# Patient Record
Sex: Male | Born: 1950 | Race: Black or African American | Hispanic: No | Marital: Married | Smoking: Never smoker
Health system: Southern US, Community
[De-identification: ages and names within clinical notes are randomized; demographics above are authoritative.]

## PROBLEM LIST (undated history)

## (undated) DIAGNOSIS — E119 Type 2 diabetes mellitus without complications: Secondary | ICD-10-CM

## (undated) DIAGNOSIS — E785 Hyperlipidemia, unspecified: Secondary | ICD-10-CM

## (undated) DIAGNOSIS — F419 Anxiety disorder, unspecified: Secondary | ICD-10-CM

## (undated) DIAGNOSIS — I10 Essential (primary) hypertension: Secondary | ICD-10-CM

## (undated) HISTORY — DX: Essential (primary) hypertension: I10

## (undated) HISTORY — DX: Hyperlipidemia, unspecified: E78.5

## (undated) HISTORY — PX: APPENDECTOMY: SHX54

## (undated) HISTORY — DX: Type 2 diabetes mellitus without complications: E11.9

## (undated) HISTORY — DX: Anxiety disorder, unspecified: F41.9

---

## 2006-11-15 ENCOUNTER — Ambulatory Visit: Payer: Self-pay | Admitting: Internal Medicine

## 2006-11-15 ENCOUNTER — Inpatient Hospital Stay (HOSPITAL_COMMUNITY): Admission: EM | Admit: 2006-11-15 | Discharge: 2006-11-18 | Payer: Self-pay | Admitting: Internal Medicine

## 2006-11-16 ENCOUNTER — Encounter: Payer: Self-pay | Admitting: Cardiovascular Disease

## 2006-11-30 ENCOUNTER — Ambulatory Visit: Payer: Self-pay | Admitting: Internal Medicine

## 2006-12-11 ENCOUNTER — Ambulatory Visit: Payer: Self-pay | Admitting: Emergency Medicine

## 2007-03-23 ENCOUNTER — Ambulatory Visit: Payer: Self-pay | Admitting: Cardiology

## 2007-12-30 ENCOUNTER — Ambulatory Visit: Payer: Self-pay | Admitting: Cardiology

## 2008-01-10 ENCOUNTER — Ambulatory Visit: Payer: Self-pay | Admitting: Cardiology

## 2010-08-18 ENCOUNTER — Encounter: Payer: Self-pay | Admitting: Emergency Medicine

## 2010-11-20 ENCOUNTER — Other Ambulatory Visit: Payer: Self-pay | Admitting: Family Medicine

## 2010-11-20 ENCOUNTER — Ambulatory Visit
Admission: RE | Admit: 2010-11-20 | Discharge: 2010-11-20 | Disposition: A | Discharge status: Home / Self Care | Payer: PRIVATE HEALTH INSURANCE | Source: Ambulatory Visit | Attending: Family Medicine | Admitting: Family Medicine

## 2010-11-20 ENCOUNTER — Other Ambulatory Visit: Payer: Self-pay

## 2010-11-20 DIAGNOSIS — R0789 Other chest pain: Secondary | ICD-10-CM

## 2010-11-20 DIAGNOSIS — R9389 Abnormal findings on diagnostic imaging of other specified body structures: Secondary | ICD-10-CM

## 2010-11-20 MED ORDER — IOHEXOL 300 MG/ML  SOLN
75.0000 mL | Freq: Once | INTRAMUSCULAR | Status: AC | PRN
  Administered 2010-11-20: 75 mL via INTRAVENOUS

## 2010-12-10 NOTE — Assessment & Plan Note (Signed)
[facility]                          [facility] OFFICE NOTE   NAME:Barry Smith, Barry Smith                          MRN:          [id number]  DATE:12/30/2007                            DOB:          1950/09/15    REFERRING PHYSICIAN:  Ernestina Penna, M.D.   REASON FOR REFERRAL:  Chest pain.   HISTORY OF PRESENT ILLNESS:  [name] is a 60 year old male patient  with a history of hypertension, diabetes, and hyperlipidemia who was  evaluated by Dr. Gala Romney in April 2008 when he presented with  shortness of breath and chest pain.  He was seen by pulmonology and was  treated for pneumonia.  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  He did have an echocardiogram that was somewhat abnormal, with an  EF of 45%-50% and noted hypokinesis of the posterolateral wall.  The  patient did see Dr. Gala Romney in followup.  He recommended that the  patient undergo stress testing.  The patient was to follow up with Dr.  Andee Lineman in [place] due to convenience.  The stress test was done in August  2008 and revealed an EF of 55%.  There was a small basal inferior defect  which was nonreversible that could be consistent with diaphragmatic  attenuation.  However, a small area of prior infarct or scar could not  be ruled out.  This study was felt to be low risk at that time.   The patient improved from a pulmonary standpoint.  He was in his usual  state of health until approximately 2 months ago, when he developed  right arm and chest pain while at rest.  He described the chest pain as  a sharp discomfort.  It lasted for 48 hours and was constant.  The pain  did worsen with any type of activity.  He did note associated shortness  of breath.  He also noted some symptoms that sound consistent with  orthopnea at the time.  He denied any cough at the time.  There was no  associated nausea or diaphoresis.   There was no associated syncope.  Since this episode, he has felt normal.  He is quite active at the  copper mine.  He goes up multiple steps a day without chest pain or  shortness of breath.  He was doing yard work this morning for 4 hours  without chest pain or shortness of breath.  He denies any orthopnea,  PND, or pedal edema.  Denies any syncope or near-syncope.   CURRENT MEDICATIONS:  1. Aspirin 81 mg daily.  2. Vytorin 10/40 mg daily.  3. Metformin 500 mg 2 the morning, 3 in the evening.  4. Diovan 80 mg daily.  5. Garlic pills over the counter 2 b.i.d.  6. Januvia 100 mg daily.  7. Claritin p.r.n.   ALLERGIES:  NO KNOWN DRUG ALLERGIES.   PAST MEDICAL HISTORY:  1. Hypertension.  2. Diabetes.  3. Hyperlipidemia.  4. Status post appendectomy as a child.  SOCIAL HISTORY:  He denies any tobacco or alcohol abuse.  He is married  and has two adult children.  He works at [employer] in  [place].   FAMILY HISTORY:  Significant for diabetes mellitus in his father.  He  died in his 69s of complications from diabetes.  No premature CAD noted.   REVIEW OF SYSTEMS:  Please see HPI.  Denies any fevers, chills, cough,  melena, hematochezia, hematuria, or dysuria.  Denies any claudication  sounds.  The rest of the review of systems are negative.   PHYSICAL EXAMINATION:  GENERAL:  He is a well-nourished, well-developed  male, in no distress.  VITAL SIGNS:  Blood pressure 112/72, pulse 86, weight 186 pounds.  HEENT:  Normal.  NECK:  Without JVD.  LYMPHATIC:  Without lymphadenopathy.  ENDOCRINE:  Without thyromegaly.  CARDIAC:  Normal S1, S2.  Regular rate and rhythm.  No murmur.  LUNGS:  Clear to auscultation bilaterally.  ABDOMEN:  Soft, nontender, with normoactive bowel sounds.  No  organomegaly.  EXTREMITIES:  Without edema.  Calves soft, nontender.  SKIN:  Warm and dry.  NEUROLOGIC:  He is alert and oriented x3.  Cranial nerves II-XII are  grossly intact.   VASCULAR:  No carotid artery bruits noted bilaterally.  Femoral artery  pulses are 2+ bilaterally, without bruits.  Dorsalis pedis and posterior  tibialis pulses are 2+ bilaterally.   Electrocardiogram reveals sinus rhythm, with a heart rate of 71, normal  axis LVH, interventricular conduction delay.  Small Q-waves in II, III,  and aVF.  When compared to previous tracings, the Q-waves seem to be  chronic.   ASSESSMENT AND PLAN:  1. Recent episode of chest pain.  His symptoms certainly sound      worrisome for an out-of-hospital myocardial infarction.  His EKG      does not look that different from prior tracings.  He is not having      any symptoms of angina at this point in time and overall seems to      be stable.  After further discussion with Dr. Myrtis Ser, we have decided      to proceed with a stress Cardiolite study as well as an      echocardiogram.  If he has significantly reduced LV function or a      high-risk scan, then we will certainly need to consider further      evaluation (i.e., cardiac catheterization).  2. Multiple cardiac risk factors, including diabetes mellitus,      hypertension, and hyperlipidemia.  He will need continued      aggressive risk factor modification.  He will continue on an      aspirin a day.  His goal LDL is less than or equal to 70, given his      coronary artery disease equivalent (diabetes mellitus).  Management      of his lipids and hypertension will be per his primary care      physician.   DISPOSITION:  The patient follow up with Dr. Myrtis Ser or myself in the next  4 weeks for followup on the above.      Tereso Newcomer, PA-C  Electronically Signed      Luis Abed, MD, [facility]  Electronically Signed   SW/MedQ  DD: 12/30/2007  DT: 12/30/2007  Job #: [id number]   cc:   Ernestina Penna, M.D.

## 2010-12-10 NOTE — Assessment & Plan Note (Signed)
[facility]                            CARDIOLOGY OFFICE NOTE   NAME:Barry Smith, Temme                          MRN:          [id number]  DATE:11/30/2006                            DOB:          01/10/1951    PRIMARY CARE PHYSICIAN:  Lindaann Pascal, PA-C, [facility].   PULMONOLOGIST:  Leslye Peer, MD.   INTERVAL HISTORY:  [name] is a very pleasant, 60 year old male who  was recently admitted for acute dyspnea. He was found to have  pneumonitis which was thought secondary to copper inhalation although  his copper level was normal. He improved markedly with steroids. While  in the hospital, he also had a 2-D echocardiogram which showed an EF of  45 to 50%. There was some question of hypokinesis over the  posterolateral wall. There was no significant valvular disease. He was  also found to have a thyroid nodule.   He returns today for routine followup. He is feeling much better. He is  getting back to all of his activities without any significant dyspnea.  He has not had any chest pain.   PAST MEDICAL HISTORY:  1. History of pneumonitis in April 2008 thought secondary to copper      inhalation with normal copper level.  2. Diabetes.  3. Hypertension.  4. Hypercholesterolemia.  5. Thyroid nodule.   CURRENT MEDICATIONS:  1. Aspirin 81.  2. Vytorin.  3. Metformin 1000 in the morning, 1500 at night.  4. Diovan.   PHYSICAL EXAMINATION:  GENERAL:  He is well-appearing in no acute  distress. He ambulates around the clinic without any respiratory  difficulty.  VITAL SIGNS:  Blood pressure is 122/62, heart rate is 85.  HEENT:  Normal.  NECK:  Supple, no JVD. Carotids are 2+ bilaterally without any bruits.  There is no lymphadenopathy or thyroid.  CARDIAC:  Regular rate and rhythm. No murmurs, rubs or gallops.  LUNGS:  Clear.  ABDOMEN:  Soft, nontender, nondistended, there is no hepatosplenomegaly,  no bruits, no masses,  good bowel sounds.  EXTREMITIES:  Warm with no cyanosis, clubbing or edema. Good pulses.  NEUROLOGIC:  He is alert and oriented x3. Cranial nerves II-XII are  intact. He does have a hoarse voice. Moves all 4 extremities without  difficulty. Affect is bright.   EKG shows normal sinus rhythm with sinus arrhythmia. There is minimal T  wave flattening in the high lateral wall, also mild U waves.   ASSESSMENT/PLAN:  1. Shortness of breath. His pneumonitis seems to be resolving. He is      now wearing a mask at work. He will followup with pulmonary.  2. Abnormal echocardiogram. This is asymptomatic. We will plan on a      stress test, a treadmill Myoview. He has asked that we schedule      this in [place] if possible.  3. Hypertension well controlled.  4. Hyperlipidemia followed by primary care Arriona Prest.   DISPOSITION:  Mr. Ore is asked to transfer his care to Dr. Andee Lineman in  [place] if at all possible to save  on commuting. We will send him a copy of  his note. I told him he is to feel free to call me at any time if he  should need anything.     Bevelyn Buckles. Bensimhon, MD     DRB/MedQ  DD: 11/30/2006  DT: 11/30/2006  Job #: [id number]   cc:   Lindaann Pascal, PA-C  Leslye Peer, MD

## 2010-12-13 NOTE — Assessment & Plan Note (Signed)
[facility]                             PULMONARY OFFICE NOTE   NAME:Barry Smith, Barry Smith                          MRN:          [id number]  DATE:12/11/2006                            DOB:          27-Oct-1950    HISTORY:  Mr. Jurewicz is a 60 year old gentleman who was admitted to the  hospital between November 15, 2006, and November 18, 2006, for acute dyspnea.  He also has some associated chest discomfort and bilateral interstitial  infiltrates.  He was initially admitted to the cardiology service.  A CT  scan of his chest on November 16, 2006, showed no evidence of pulmonary  embolism, a right thyroid hypo-density, bibasilar bronchiectasis and  atelectasis, tiny bibasilar pleural effusions, cardiomegaly and mild  bilateral hilar mediastinal lymphadenopathy.  Pulmonary was consulted.  He was  treated empirically for possible community-acquired pneumonia,  versus bronchitis.   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  It was also felt that his  bronchiectasis and chronic flares of pulmonary disease were due to his  copper dust exposure.  Sarcoidosis and lymphoma were also  considerations, but were felt to be much less likely.  A serological  workup was completed, as detailed below.  He was treated with  corticosteroids and has improved.  He is now finished with his  prednisone.  His breathing is about back to baseline.  He complains of a  hoarse voice.  He continues to have a dry cough, especially at night  when he goes to sleep.   CURRENT MEDICATIONS:  1. Aspirin 81 mg daily.  2. Vytorin once daily.  3. Metformin 1000 mg q.a.m. and 1500 mg q. afternoon.  4. Diovan once daily.   PHYSICAL EXAMINATION:  GENERAL:  This pleasant African/American man, who  is in no distress.  LUNGS:  He has a very hoarse voice.  He is coughing  during the  examination.  He has no stridor.  His lungs are now clear to  auscultation bilaterally, which is an improvement compared with his  hospital exam, at which time he was coarse.  HEART:  Regular without murmur.  ABDOMEN:  Obese but benign.  EXTREMITIES:  No clubbing, cyanosis or edema.   A CT scan of his chest is as detailed above.   LABORATORY DATA:  From his hospitalization revealed a serum copper level  of 116, which is within the normal range.  CH-50 compliment of 63.  C-  reactive protein 8.9, which is elevated.  Antinuclear antibodies were  negative.  Rheumatoid factor was negative.  His ESR was 36.   Pulmonary function testing performed today shows normal air flow without  any bronchodilator responsiveness.  His lung volumes are normal and his  diffusion capacity is normal.   IMPRESSION:  1. Recent admission with dyspnea and hypoxemia, probably acute      pneumonitis, secondary to copper dust exposure:  I believe he      merits a  repeat CT scan of the chest next month, to ensure that his      infiltrates are improved and that his bronchiectasis is stable.  2. Bronchiectasis.  3. Chronic cough:  I question whether postnasal drip or      gastroesophageal reflux disease may be influencing this.  I doubt      that this is bronchiectasis, in the absence of sputum.  I will      treat him empirically with Claritin 10 mg daily, Nasacort two      sprays to each nostril daily and Prilosec b.i.d., to see if I can      influence the factors effecting his cough.   FOLLOWUP:  Mr. Erdahl will return to see me in one month, to review the  above.     Leslye Peer, MD  Electronically Signed    RSB/MedQ  DD: 12/28/2006  DT: 12/29/2006  Job #: [id number]

## 2010-12-13 NOTE — Discharge Summary (Signed)
Barry Smith, Barry Smith                 ACCOUNT NO.:  [id number]   MEDICAL RECORD NO.:  [id number]          PATIENT TYPE:  INP   LOCATION:  2918                         FACILITY:  [facility]   PHYSICIAN:  Bevelyn Buckles. Bensimhon, MDDATE OF BIRTH:  1951-06-11   DATE OF ADMISSION:  11/15/2006  DATE OF DISCHARGE:  11/18/2006                               DISCHARGE SUMMARY   PRIMARY CARE PHYSICIAN:  Lindaann Pascal, physician assistant at [facility].   PRIMARY CARDIOLOGIST:  Dr. Arvilla Meres.   CONSULTING PHYSICIAN:  Dr. Delton Coombes.   PRIMARY DIAGNOSES:  1. Hypoxia in the setting of pneumonitis.  2. Diabetes.  3. Hypertension.  4. Thyroid nodule.  5. Hypercholesterolemia.  6. 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   HISTORY OF PRESENT ILLNESS:  A 60 year old African-American male with a  history of diabetes and hypertension along with hyperlipidemia who  presented to primary care providers office after 4 days of continued  shortness of breath and some chest pain with some fevers.  The patient  was seen at [facility] and transferred here for cardiology  service secondary to continued shortness of breath and chest discomfort.  The patient also had complaints of non-productive cough associated with  fevers and chills.  The patient has been started on a Z-pack and  Tussionex prior to admission through his primary care [name].   The patient was seen and examined by Dr. Arvilla Meres on arrival.  He was found to be cool and clammy, and markedly short of breath.  Dr.  Delton Coombes was consulted concerning pulmonary status.  The patient was  started on a non-rebreather mask for assist with pulmonary status.   The patient did have cardiac enzymes and a BMP along with a chest x-ray  completed.  The chest x-ray was unimpressive.  He did have a follow up  CT scan to rule out PE, which was also found to be negative.  Incidentally, the patient was found to have a right thyroid  hypodensity  on the CT scan with mild bibasilar hilar and mid adenopathy.  Ultrasound  of the thyroid was recommended.  Lymphoma and sarcoidosis are also  considerations per radiologist.  During hospitalization, the patient was  started on p.o. prednisone and Avelox, along with respiratory breathing  treatments.  The patient's copper level was also evaluated during  hospitalization and found to be 121, which was in the normal limit range  per pulmonary.  ESR was found to be 36.  The patient's CRP was found to  be 8.9.   On the day of discharge, the patient was seen and examined by myself,  Dr. Arvilla Meres, and Dr. Delton Coombes.  The patient was found to be stable  for discharge.  The patient will continue 7 days of steroids and 10 days  of antibiotics post discharge.  He is to follow up with Dr. Delton Coombes for  continued pulmonary care and PFTs.  The patient will also follow up with  Dr. Gala Romney in 2 weeks for evaluation of cardiac status and planned  outpatient stress Myoview.  The patient is also  to follow with his  primary care [name], Lindaann Pascal, P.A., for further evaluation of the  thyroid nodule with ultrasound recommended.   DISCHARGE LABS:  Sodium 134, potassium 4.2, chloride 102.  CO2 25, BUN  7, creatinine 0.8, glucose 161.  Hemoglobin 12.3, hematocrit 35.6, white  blood cells 6.4, platelets 224,000.  Cholesterol 117, lipids 119, HDL  25, LDL 68.  TSH 1.497.  CRP 8.9.  Copper level 121.   VITAL SIGNS:  Blood pressure 104/69, heart rate 75, respirations 18.   DISCHARGE MEDICATIONS:  1. Aspirin 81 mg daily.  2. Prednisone 20 mg times three days, then prednisone 10 mg times      three days, then stop.  3. Avelox 400 mg daily times ten days.  4. Vytorin at home dose.  5. Metformin 500 mg once a day.   ALLERGIES:  NO KNOWN DRUG ALLERGIES.   FOLLOW UP PLANS AND APPOINTMENTS:  1. The patient will follow up with his primary care Zarina Pe, Lindaann Pascal, P.A. at [facility] for continued      medical management.  It is advised that the patient have follow up      thyroid ultrasound secondary to nodule recently found on CT scan.  2. The patient will follow up with Dr. Arvilla Meres on Nov 30, 2006      at 11:45 a.m. for continued cardiac management with discussion of      need for outpatient stress Myoview.  3. The patient is to follow up with Dr. Delton Coombes on Dec 02, 2006 at 9:20      in the morning for continued pulmonary evaluation and management      with PFTs planned.  4. The patient is to wear a mask at work.  Masks from the hospital are      provided to the patient to use while at work during the next 2      weeks until seen again by pulmonary.  5. The patient has been advised on new medications and prescriptions      which are provided.  6. The patient can return to work on Monday, November 23, 2006 as long as      he wears his mask.   Time spent with the patient to include physician time 35 minutes.      Bettey Mare. Lyman Bishop, NP      Bevelyn Buckles. Bensimhon, MD  Electronically Signed    KML/MEDQ  D:  11/18/2006  T:  11/18/2006  Job:  [id number]   cc:   Leslye Peer, MD  Lindaann Pascal, [place].A.

## 2010-12-13 NOTE — H&P (Signed)
Barry Smith, Barry Smith                 ACCOUNT NO.:  [id number]   MEDICAL RECORD NO.:  [id number]          PATIENT TYPE:  INP   LOCATION:  2918                         FACILITY:  [facility]   PHYSICIAN:  Bevelyn Buckles. Bensimhon, MDDATE OF BIRTH:  1951-06-13   DATE OF ADMISSION:  11/15/2006  DATE OF DISCHARGE:                              HISTORY & PHYSICAL   PRIMARY CARE PHYSICIAN:  Scott Long, PA-C. at [facility].  He is new to [facility].   REASON FOR ADMISSION:  Shortness of breath, cough, chest pain.   HISTORY OF PRESENT ILLNESS:  Mr. Lunz is a very pleasant 60 year old  male with history of hypertension, hyperlipidemia, and diabetes.  He has  no known history of coronary artery disease.  He has never had a  previous stress test or cardiac catheterization.  He is a nonsmoker.  He  was well until Thursday when he developed severe nonproductive cough  associated with fevers and chills.  He saw his primary care physician on  Friday.  Started a Z-Pak and  Tussionex.  Over the weekend, he had increased shortness of breath with  a nonproductive cough.  He has had also pleuritic chest pain associated  with a cough.  He finally went to [facility].  He was diaphoretic  and short of breath.  His temperature was 99.  His chest x-ray was  negative.  EKG had a questionable sinus tachycardia with questionable  minimal ST elevations in V1.  Troponin was 0.04 and BNP was 7.  White  count was 6.9.  There was concern for unstable angina.  He was started  on heparin and also given morphine for chest pain when he dropped his  systolic blood pressure from 110 into the 90s.  He was given Tylenol,  over 2 L of normal saline for resuscitation.   On arrival from [facility], he was clammy with systolic blood  pressure in the 120s.  He markedly short of breath with talking and  unable to speak in full sentences.  His respiratory rate ranged from the  20s to the 30s.   He denied any chest pain.  His saturations were 93% on  2 L and he was changed to a nonrebreather for comfort.   On review of systems, he denies any hemoptysis.  He has not had sick  contacts.  There has been no TB exposure.  He denies any diarrhea.  No  neurologic symptoms.  No lower extremity edema.  No orthopnea or PND.  He denies any previous history of exertional chest pain.  The remainder  of the review of systems is negative except for HPI and problem list.   PAST MEDICAL HISTORY:  1. Diabetes for three years.  2. Hypertension.  3. Hyperlipidemia.   CURRENT MEDICATIONS:  1. Vytorin unknown dose.  2. Aspirin 81 mg a day.  3. Metformin 500 mg a day.  4. Zithromax.  5. Tussionex.   ALLERGIES:  No known drug allergies.   SOCIAL HISTORY:  [place].  He  denies smoking.  He denies any alcohol or drugs.  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   FAMILY HISTORY:  No family history of coronary artery disease.  His  father died due to diabetes over 35 years ago.  He does not know how old  he was.  Mother is alive and well.  He has 12 brothers and sisters with  no family history of premature coronary artery disease.   PHYSICAL EXAMINATION:  GENERAL:  He is tachypenic.  It is hard for him  to speak in full sentences.  VITAL SIGNS:  Respiratory rate ranges from 20 to 30.  Oxygen saturation  93-94% on 2 L.  HEENT:  Normal  NECK:  Supple.  No JVD.  Carotids are 2+ bilaterally without any bruits.  There is no lymphadenopathy or thyromegaly.  CARDIAC:  He has regular rate and rhythm.  No murmurs, rubs or gallops.  LUNGS:  Clear with no wheezes or rales.  ABDOMEN:  Mildly distended, but nontender.  Soft, obese.  No  hepatosplenomegaly.  No bruits.  No masses appreciated.  EXTREMITIES:  Initially clammy but are now warm and dry.  Good distal  pulses.  No cyanosis, clubbing or edema.  NEUROLOGIC:  Alert and oriented x3.  Cranial nerves II-XII are  grossly  intact. Moves all four extremities without difficulty.  Affect is  pleasant.   LABORATORY DATA:  Chest x-ray shows just bibasilar atelectasis with mild  cardiomegaly.  No infiltrate or heart failure.  EKG shows normal sinus  rhythm at a rate of 79 with LVH.  There is some mild J point elevation.  No acute ST-T wave abnormalities.   Labs showed a white count of 6.9, 80% polys, hemoglobin 13.3, platelets  241,000.  Sodium 128, potassium 3.5, BUN 10, creatinine 0.9, chloride  92, bicarb 24. BNP 7.  CK 647, MB 0.9, troponin 0.04.   Bedside echo showed an EF of 60% with no wall motion abnormalities.  There is no AI or MR.  The RV view is not well seen but appeared grossly  normal.   ASSESSMENT:  1. Dyspnea of unclear etiology.  2. Pleuritic chest pain.  3. Fevers.  4. Hypertension.  5. Hyperlipidemia.  6. Diabetes.  7. Hyponatremia.   PLAN/DISCUSSION:  Given his normal echo, BNP and cardiac enzymes in the  setting of persistent cough and fevers, my suspicion is that this is a  primary pulmonary problem in nature, either pneumonia or pulmonary  embolus.  His chest x-ray is unimpressive.  We will proceed with a CT  scan of the chest to rule out PE and also to get further  evaluation of his lung parenchyma.  Also check blood cultures and sputum  cultures and cycle his cardiac markers to rule out myocardial  infarction.  Will hold the antibiotics for now and continue heparin  until the results of his CT scan.      Bevelyn Buckles. Bensimhon, MD  Electronically Signed     DRB/MEDQ  D:  11/16/2006  T:  11/16/2006  Job:  [id number]   cc:   Lindaann Pascal, PA-C

## 2010-12-13 NOTE — H&P (Signed)
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  He is a nonsmoker and  has never really had a cardiac workup.  The cardiologist has worked him  up and are unclear at this time of his shortness of breath complaint.  To note, he went to [facility] and was transferred here under  the cardiology service, underwent testing and at this time is not  significant for cardiac etiology.  We were called for consultation  secondary to shortness of breath with an unclear etiology at this time.  Mr. Allen again denies hemoptysis, but does have shortness of breath  and fevers over the 4 or so day duration.  He did say that there was  something going around at work, but he does not note any significant  major inhalation episode recently.  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  He is being treated appropriately for pneumonia  at this time and then recently had a spiral CT of his chest, which did  not show any pulmonary embolism, but did show bronchiectasis.   ALLERGIES:  NO KNOWN DRUG ALLERGIES.   PAST MEDICAL HISTORY/SURGICAL HISTORY:  1. Diabetes.  2. Hypertension.  3. Hyperlipidemia.  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   SOCIAL HISTORY:  He is married, has a daughter.  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  He  says the dust is clear, but you know that you are being exposed to it.   FAMILY HISTORY:  Positive for diabetes.   REVIEW OF SYSTEMS:  No significant weight loss.  No hemoptysis.  No  burning with urination.  No blood seen in the urine.  There is no  history of sarcoidosis, lung cancer.   MEDICATIONS IN HOSPITAL:  Avelox, Xopenex, Atrovent, aspirin, Protonix,  Lovenox, insulin and Mucinex.   PHYSICAL EXAMINATION:  VITAL SIGNS:  Shows a temperature of 98.7.  Heart  rate 93.  Blood pressure 104/57.  Saturation 94%.  Respiratory rate 16.  He has net 1.13 liters over the last 24 hours.  GENERAL:  [age]-year-old male in no respiratory distress, speaking  full sentences with having significant coughing with a dry cough with  no  apparent distress.  HEENT EXAMINATION:  Showed no thyroid nodule felt.  I did palpate his  thyroid, which seemed to be equal, slightly right side greater than  left, but essentially equal with no  lymphadenopathy in the  supraclavicular or infraclavicular areas or the axillary areas.  LUNG EXAMINATION:  Showed slight coarseness bilaterally anteriorly.  No  rhonchi.  No wheezes.  HEART EXAM:  S1, S2.  Regular rate, but tachycardic.  No rubs, no  murmurs, no gallops.  ABDOMINAL EXAMINATION:  Soft belly.  Bowel sounds are present.  Nontender, nondistended.  No rebound.  No guarding.  EXTREMITY EXAMINATION:  Showed no edema with good pulses.  CENTRAL NERVOUS SYSTEM EXAMINATION:  Alert and oriented x3.  Nonfocal.   LABORATORY DATA:  Showed a portable chest x-ray with a slight elevation  in the left hemidiaphragm consistent with bilateral atelectasis with  small lung volumes with a slight interstitial prominence, which is  probably related to  his small lung volume.  CT scan showed  bronchiectasis bilaterally with some bibasilar atelectasis with no  significant major pulmonary embolism noted.  Chemistry showed a sodium  of 137, potassium 3.9, chloride 103, bicarb 25, BUN 7, creatinine 0.84,  glucose 132.  Troponin negative.  White count is 5.4, hemoglobin 12.7,  hematocrit 37.2, platelet count 222.  Last blood gas sample shows 7.43  is the pH, PCO2 of 31, PAO2 of 57 only saturating 89%, not listed how  much oxygen it was on.  LFTs, which were done, which are within normal  limits.   ASSESSMENT:  1. Hypoxia.      a.     Etiology to rule out bronchitis of viral versus bacteria.      b.     To rule out occupational exposure with acute copper       inhalation injury and to rule out sarcoidosis.  2. Bronchiectasis.  Most like secondary to copper dust exposure.  3. Rule out thyroid nodule.   PLAN:  This patient has a very strong history for copper dust exposure.  I see in multiple patients with inhalation injury, which presents very  similarly with severe cough with fevers and acute shortness of breath.  It is very well possible that this patient is suffering from copper dust  exposure, which he is unaware of.  I will send a serum copper level,  which may be helpful.  I am not quite sure, we will have to see what  level comes back.  This patient does have a temperature of 102.  I will  reculture him with his blood and see if is productive.  Send a sputum  gram stain culture sensitivity.  If this patient were to decline his  respiratory status, I would add vancomycin until methicillin-resistant  staphylococcus aureus is ruled out because he is still having fever  spikes on Avelox.  Avelox is a wonderful choice for antibiotic, which  will be continued because it does cover atypicals very aggressively and  for a community acquired process.  I will check a urine strep antigen and also will assess the thyroid nodules seen on CT scan  with an  ultrasound, as well as checking thyroid functions and serum testing.  I  will also send an ACE level as far as sarcoidosis is concerned and there  was also some mild mediastinal lymphadenopathy noted on CAT scan, which  could be just inflammatory in nature and reactive.  This patient also I  would recommend  pulmonary function testing as an outpatient.  I have  discussed this with the wife at the bedside and patient and let them  know that when his acute illness is improving, he should require that.  Also, codeine in the form of Robitussin to control such a significant  cough and will await urine Legionella testing.  Also, there is no  defined infiltrate on CT scan of the chest; however, he does present  with what seems to be possible an atypical illness.  We will titrate his  oxygen down to keep his saturations at 94% or greater.  This  presentation also could be viral in nature and we will be assessing a  differential as well.  Thank you for the consultation and taking part in  this patient's care.     Nelda Bucks, MD  Electronically Signed    DJF/MEDQ  D:  11/16/2006  T:  11/16/2006  Job:  [id number]   cc:   Bevelyn Buckles. Bensimhon, MD

## 2012-11-09 ENCOUNTER — Other Ambulatory Visit: Payer: Self-pay | Admitting: *Deleted

## 2012-11-09 MED ORDER — METFORMIN HCL 500 MG PO TABS: ORAL_TABLET | ORAL | Status: DC

## 2012-12-22 ENCOUNTER — Telehealth: Payer: Self-pay | Admitting: Family Medicine

## 2012-12-22 NOTE — Telephone Encounter (Signed)
no samples patient aware

## 2012-12-24 ENCOUNTER — Ambulatory Visit: Payer: Self-pay | Admitting: Family Medicine

## 2012-12-24 NOTE — Telephone Encounter (Signed)
Pt aware no samples available . 

## 2013-01-04 ENCOUNTER — Telehealth: Payer: Self-pay | Admitting: *Deleted

## 2013-01-04 NOTE — Telephone Encounter (Signed)
Change to losartan 100mg  po qd

## 2013-01-04 NOTE — Telephone Encounter (Signed)
Ins co. Denied coverage on diovan  Until he tries one of these 1)candesartan/HCTZ,2(ibesartan, 3)ibersartan/HCTZ 4) valsartan/HCTZ 5)losartan 6)losartan/HCTZ  7) eprosartan, 8) eprotosartan/HCTZ. Can you help me with this. He was on bisoprolol/HCTZ at one time but has been on diovan since 2007

## 2013-01-06 ENCOUNTER — Other Ambulatory Visit: Payer: Self-pay | Admitting: Family Medicine

## 2013-01-06 MED ORDER — LOSARTAN POTASSIUM 100 MG PO TABS: 100.0000 mg | ORAL_TABLET | Freq: Every day | ORAL | Status: DC

## 2013-01-06 NOTE — Telephone Encounter (Signed)
lOSARTAN 100MG  1 Q DAY # 30 WITH  2 REFILLS CALLED IN TO [facility] IN [place]

## 2013-01-07 ENCOUNTER — Telehealth: Payer: Self-pay | Admitting: *Deleted

## 2013-01-07 ENCOUNTER — Other Ambulatory Visit: Payer: Self-pay | Admitting: Family Medicine

## 2013-01-07 MED ORDER — SIMVASTATIN 20 MG PO TABS: 20.0000 mg | ORAL_TABLET | Freq: Every day | ORAL | Status: DC

## 2013-01-07 NOTE — Telephone Encounter (Signed)
I had a call from the ins co today and they denied Barry Smith's vytorin, they said he has to have tried and failed simvastatin, pravastatin or atorvastatin before they will approve his vytorin.  I tried arguing the fact that neither of those is equivalent to vytorin. However they said the Dr. Could call and state his case, I tried. Can you call in one of these or call and have a go with the ins co either one.  Thanks

## 2013-01-07 NOTE — Telephone Encounter (Signed)
I will rx him simvastatin.

## 2013-01-11 ENCOUNTER — Telehealth: Payer: Self-pay | Admitting: Family Medicine

## 2013-01-11 DIAGNOSIS — E119 Type 2 diabetes mellitus without complications: Secondary | ICD-10-CM

## 2013-01-11 NOTE — Telephone Encounter (Signed)
Spoke with wife  Questions regarding meds that are not in epic She was advise we would get paper chart and call her tomorrow

## 2013-01-12 MED ORDER — LIRAGLUTIDE 18 MG/3ML ~~LOC~~ SOPN: 1.2000 "application " | PEN_INJECTOR | Freq: Every day | SUBCUTANEOUS | Status: DC

## 2013-01-12 NOTE — Telephone Encounter (Addendum)
  Spoke with wife needs samples of victoza  Aware sample is in refrig for victoza

## 2013-01-12 NOTE — Telephone Encounter (Signed)
Wife  aware victoza in refrig

## 2013-01-14 ENCOUNTER — Other Ambulatory Visit: Payer: Self-pay

## 2013-01-14 DIAGNOSIS — E119 Type 2 diabetes mellitus without complications: Secondary | ICD-10-CM

## 2013-01-14 MED ORDER — LIRAGLUTIDE 18 MG/3ML ~~LOC~~ SOPN: 1.2000 "application " | PEN_INJECTOR | Freq: Every day | SUBCUTANEOUS | Status: DC

## 2013-01-14 NOTE — Telephone Encounter (Signed)
Last glucose level 08/23/12

## 2013-02-10 ENCOUNTER — Other Ambulatory Visit: Payer: Self-pay

## 2013-02-10 MED ORDER — METFORMIN HCL 500 MG PO TABS: ORAL_TABLET | ORAL | Status: DC

## 2013-02-11 ENCOUNTER — Encounter: Payer: Self-pay | Admitting: Family Medicine

## 2013-02-11 ENCOUNTER — Ambulatory Visit (INDEPENDENT_AMBULATORY_CARE_PROVIDER_SITE_OTHER): Payer: BC Managed Care – PPO | Admitting: Family Medicine

## 2013-02-11 VITALS — BP 112/69 | HR 93 | Temp 97.0°F | Wt 183.6 lb

## 2013-02-11 DIAGNOSIS — E1159 Type 2 diabetes mellitus with other circulatory complications: Secondary | ICD-10-CM | POA: Insufficient documentation

## 2013-02-11 DIAGNOSIS — I10 Essential (primary) hypertension: Secondary | ICD-10-CM

## 2013-02-11 DIAGNOSIS — I152 Hypertension secondary to endocrine disorders: Secondary | ICD-10-CM | POA: Insufficient documentation

## 2013-02-11 DIAGNOSIS — E785 Hyperlipidemia, unspecified: Secondary | ICD-10-CM

## 2013-02-11 DIAGNOSIS — E1169 Type 2 diabetes mellitus with other specified complication: Secondary | ICD-10-CM | POA: Insufficient documentation

## 2013-02-11 DIAGNOSIS — E119 Type 2 diabetes mellitus without complications: Secondary | ICD-10-CM | POA: Insufficient documentation

## 2013-02-11 LAB — COMPLETE METABOLIC PANEL WITH GFR
ALT: 22 U/L (ref 0–53)
AST: 20 U/L (ref 0–37)
Albumin: 5 g/dL (ref 3.5–5.2)
Alkaline Phosphatase: 57 U/L (ref 39–117)
BUN: 12 mg/dL (ref 6–23)
CO2: 29 mEq/L (ref 19–32)
Calcium: 9.9 mg/dL (ref 8.4–10.5)
Chloride: 102 mEq/L (ref 96–112)
Creat: 0.83 mg/dL (ref 0.50–1.35)
GFR, Est African American: >89 mL/min
GFR, Est Non African American: >89 mL/min
Glucose, Bld: 74 mg/dL (ref 70–99)
Potassium: 4.6 mEq/L (ref 3.5–5.3)
Sodium: 138 mEq/L (ref 135–145)
Total Bilirubin: 0.4 mg/dL (ref 0.3–1.2)
Total Protein: 7.3 g/dL (ref 6.0–8.3)

## 2013-02-11 LAB — POCT UA - MICROALBUMIN: Microalbumin Ur, POC: POSITIVE mg/L

## 2013-02-11 LAB — POCT GLYCOSYLATED HEMOGLOBIN (HGB A1C): Hemoglobin A1C: 6.3

## 2013-02-11 NOTE — Patient Instructions (Addendum)
Dr Woodroe Mode Recommendations  Diet and Exercise discussed with patient.  For nutrition information, I recommend books:  1).Eat to Live by Dr Monico Hoar. 2).Prevent and Reverse Heart Disease by Dr Suzzette Righter. 3) Dr Katherina Right Book:  Program to Reverse Diabetes  Exercise recommendations are:  If unable to walk, then the patient can exercise in a chair 3 times a day. By flapping arms like a bird gently and raising legs outwards to the front.  If ambulatory, the patient can go for walks for 30 minutes 3 times a week. Then increase the intensity and duration as tolerated.  Goal is to try to attain exercise frequency to 5 times a week.  If applicable: Best to perform resistance exercises (machines or weights) 2 days a week and cardio type exercises 3 days per week.    Diabetes and Foot Care Diabetes may cause you to have a poor blood supply (circulation) to your legs and feet. Because of this, the skin may be thinner, break easier, and heal more slowly. You also may have nerve damage in your legs and feet causing decreased feeling. You may not notice minor injuries to your feet that could lead to serious problems or infections. Taking care of your feet is one of the most important things you can do for yourself.  HOME CARE INSTRUCTIONS  Do not go barefoot. Bare feet are easily injured.  Check your feet daily for blisters, cuts, and redness.  Wash your feet with warm water (not hot) and mild soap. Pat your feet and between your toes until completely dry.  Apply a moisturizing lotion that does not contain alcohol or petroleum jelly to the dry skin on your feet and to dry brittle toenails. Do not put it between your toes.  Trim your toenails straight across. Do not dig under them or around the cuticle.  Do not cut corns or calluses, or try to remove them with medicine.  Wear clean cotton socks or stockings every day. Make sure they are not too tight. Do not wear  knee high stockings since they may decrease blood flow to your legs.  Wear leather shoes that fit properly and have enough cushioning. To break in new shoes, wear them just a few hours a day to avoid injuring your feet.  Wear shoes at all times, even in the house.  Do not cross your legs. This may decrease the blood flow to your feet.  If you find a minor scrape, cut, or break in the skin on your feet, keep it and the skin around it clean and dry. These areas may be cleansed with mild soap and water. Do not use peroxide, alcohol, iodine or Merthiolate.  When you remove an adhesive bandage, be sure not to harm the skin around it.  If you have a wound, look at it several times a day to make sure it is healing.  Do not use heating pads or hot water bottles. Burns can occur. If you have lost feeling in your feet or legs, you may not know it is happening until it is too late.  Report any cuts, sores or bruises to your caregiver. Do not wait! SEEK MEDICAL CARE IF:   You have an injury that is not healing or you notice redness, numbness, burning, or tingling.  Your feet always feel cold.  You have pain or cramps in your legs and feet. SEEK IMMEDIATE MEDICAL CARE IF:   There is increasing redness,  swelling, or increasing pain in the wound.  There is a red line that goes up your leg.  Pus is coming from a wound.  You develop an unexplained oral temperature above 102 F (38.9 C), or as your caregiver suggests.  You notice a bad smell coming from an ulcer or wound. MAKE SURE YOU:   Understand these instructions.  Will watch your condition.  Will get help right away if you are not doing well or get worse. Document Released: 07/11/2000 Document Revised: 10/06/2011 Document Reviewed: 01/17/2009 [facility] Patient Information 2014 [place], [place].   Diabetic Retinopathy Having diabetes for a long time, especially if it is not controlled, can damage the light-sensitive membrane at the  back of the eye (retina). The disease of the retina caused by diabetes is called diabetic retinopathy. Taking good care of your diabetes helps reduce the risk of developing diabetic retinopathy. Have regular eye exams. Early detection is the key to keeping your eyes healthy. Diabetes can also affect other parts of the eye with vision-threatening results, such as cataracts and a form of glaucoma that is very difficult to treat. SYMPTOMS  In the early stages of diabetic retinopathy, there are often no symptoms. As the condition advances, symptoms may include:  Blurred vision. This may go away when blood glucose (sugar) levels are normal. This type of reversible change in vision is usually due to swelling of the lens.  Moving speck or dark spots (floaters) in your vision. This can be caused by small amounts of blood (hemorrhages) escaping from the blood vessels of the retina.  Missing parts of your field of vision. This can be caused by larger hemorrhages within the tissue of the retina.  Poor night vision.  Poor color vision.  Sudden drop or loss of vision in one eye. This may be caused by a hemorrhage from retinal blood vessels into the cavity of the inside of the eye. You should not wait until you have symptoms. An eye care specialist can start treatment before visual impairment occurs. DIAGNOSIS  Your eye care specialist can detect the diabetic changes in your blood vessels by putting drops in your eyes to enlarge the size of (dilate) your pupils. This allows a bigger "window" through which the caregiver can see the entire inside of your eyes.  TREATMENT   If you have the type of diabetes that requires you to use insulin, your risk of diabetic retinopathy is very high. You should have your eyes checked at least every 6 months.  If you have diabetes that was diagnosed during childhood or before the age of 66, your risk of diabetic retinopathy is very high. You should have your eyes checked at  least every 6 months.  If you have diabetes that is controlled by diet, you should have the dilated eye exam when first diagnosed and yearly thereafter.  If your diabetes is not under good control as measured by your blood glucose levels and other indicators, it is critical that you have your eyes checked even more often. Your caregiver can usually see the problems of diabetic retinopathy developing long before it causes a problem. In many cases, it can be treated to prevent complications. HOME CARE INSTRUCTIONS   Keep blood pressure in goal range.  Keep blood glucose in target range.  Follow your caregiver's orders regarding diet and other means for controlling your blood glucose levels.  Check both your urine and blood levels for glucose as recommended by your caregiver. SEEK MEDICAL CARE IF:  You notice gradual blurring or other changes in your vision over time.  You notice that your glasses or contact lenses do not make things look as sharp as they once did.  You have trouble reading or seeing details at a distance with either eye. SEEK IMMEDIATE MEDICAL CARE IF:   You notice a sudden change in your vision or parts of your field of vision appear missing or hazy. This may mean you have lost some vision. Get help right away to prevent further vision loss.  You suddenly see moving specks or dark spots in the field of vision of either eye.  You have a sudden partial or total loss of vision in either eye. Document Released: 07/11/2000 Document Revised: 10/06/2011 Document Reviewed: 04/04/2009 [facility] Patient Information 2014 [place], [place].

## 2013-02-11 NOTE — Progress Notes (Signed)
Patient ID: Barry Smith, male   DOB: 1951/07/24, 62 y.o.   MRN: [id number] SUBJECTIVE: CC: Chief Complaint  Patient presents with  . Follow-up    3 month follow up diabetes    HPI: Patient is here for follow up of Diabetes Mellitus/hypertension/hyperlipidemia Symptoms of DM: Denies Nocturia ,Denies Urinary Frequency , denies Blurred vision ,deniesDizziness,denies.Dysuria,denies paresthesias, denies extremity pain or ulcers.Marland Kitchendenies chest pain. has had an annual eye exam. do check the feet. Does check CBGs. Average CBG:150 Denies episodes of hypoglycemia. Does have an emergency hypoglycemic plan. admits toCompliance with medications. Problems with medications. Cannot afford the victoza  Breakfast: tomato sandwich, steak and eggs Lunch: whatever he can grab Supper whatever his wife cooks.  Past Medical History  Diagnosis Date  . Anxiety   . Hypertension   . Hyperlipidemia   . Diabetes mellitus without complication    Past Surgical History  Procedure Laterality Date  . Appendectomy     History   Social History  . Marital Status: Single    Spouse Name: N/A    Number of Children: N/A  . Years of Education: N/A   Occupational History  . Not on file.   Social History Main Topics  . Smoking status: Never Smoker   . Smokeless tobacco: Not on file  . Alcohol Use: No  . Drug Use: No  . Sexually Active: Yes   Other Topics Concern  . Not on file   Social History Narrative  . No narrative on file   Family History  Problem Relation Age of Onset  . Diabetes Father   . Diabetes Sister   . Pneumonia Brother   . Diabetes Sister   . Diabetes Sister   . Diabetes Sister   . Diabetes Sister   . Diabetes Sister   . Cancer Sister   . Diabetes Brother   . Diabetes Brother   . Diabetes Brother   . Diabetes Brother   . Diabetes Brother    Current Outpatient Prescriptions on File Prior to Visit  Medication Sig Dispense Refill  . aspirin 81 MG tablet Take 81 mg by mouth  daily.      . cholecalciferol (VITAMIN D) 1000 UNITS tablet Take 1,000 Units by mouth daily.      . Eszopiclone (ESZOPICLONE) 3 MG TABS Take 3 mg by mouth at bedtime. Take immediately before bedtime      . glimepiride (AMARYL) 2 MG tablet Take 2 mg by mouth daily before breakfast.      . Liraglutide (VICTOZA) 18 MG/3ML SOPN Inject 1.2 application into the skin daily.  3 mL  0  . losartan (COZAAR) 100 MG tablet Take 1 tablet (100 mg total) by mouth daily.  90 tablet  3  . metFORMIN (GLUCOPHAGE) 500 MG tablet Take 2 tabs every am and 3 tabs at bedtime  150 tablet  2  . simvastatin (ZOCOR) 20 MG tablet Take 1 tablet (20 mg total) by mouth at bedtime.  90 tablet  3   No current facility-administered medications on file prior to visit.   No Known Allergies  There is no immunization history on file for this patient. Prior to Admission medications   Medication Sig Start Date End Date Taking? Authorizing Provider  aspirin 81 MG tablet Take 81 mg by mouth daily.   Yes Historical Provider, MD  ezetimibe-simvastatin (VYTORIN) 10-40 MG per tablet Take 1 tablet by mouth at bedtime.   Yes Historical Provider, MD  Multiple Vitamin (MULTIVITAMIN) capsule Take 1 capsule by  mouth daily.   Yes Historical Provider, MD  cholecalciferol (VITAMIN D) 1000 UNITS tablet Take 1,000 Units by mouth daily.    Historical Provider, MD  Eszopiclone (ESZOPICLONE) 3 MG TABS Take 3 mg by mouth at bedtime. Take immediately before bedtime    Historical Provider, MD  glimepiride (AMARYL) 2 MG tablet Take 2 mg by mouth daily before breakfast.    Historical Provider, MD  Liraglutide (VICTOZA) 18 MG/3ML SOPN Inject 1.2 application into the skin daily. 01/14/13   Mary-Margaret Daphine Deutscher, FNP  losartan (COZAAR) 100 MG tablet Take 1 tablet (100 mg total) by mouth daily. 01/06/13   Deatra Canter, FNP  metFORMIN (GLUCOPHAGE) 500 MG tablet Take 2 tabs every am and 3 tabs at bedtime 02/10/13   Ileana Ladd, MD  simvastatin (ZOCOR) 20 MG  tablet Take 1 tablet (20 mg total) by mouth at bedtime. 01/07/13   Deatra Canter, FNP   ROS: As above in the HPI. All other systems are stable or negative.  OBJECTIVE: APPEARANCE:  Patient in no acute distress.The patient appeared well nourished and normally developed. Acyanotic. Waist: VITAL SIGNS:BP 112/69  Pulse 93  Temp(Src) 97 F (36.1 C) (Oral)  Wt 183 lb 9.6 oz (83.28 kg)   SKIN: warm and  Dry without overt rashes, tattoos and scars  HEAD and Neck: without JVD, Head and scalp: normal Eyes:No scleral icterus. Fundi normal, eye movements normal. Ears: Auricle normal, canal normal, Tympanic membranes normal, insufflation normal. Nose: normal Throat: normal Neck & thyroid: normal  CHEST & LUNGS: Chest wall: normal Lungs: Clear  CVS: Reveals the PMI to be normally located. Regular rhythm, First and Second Heart sounds are normal,  absence of murmurs, rubs or gallops. Peripheral vasculature: Radial pulses: normal Dorsal pedis pulses: normal Posterior pulses: normal  ABDOMEN:  Appearance: normal Benign, no organomegaly, no masses, no Abdominal Aortic enlargement. No Guarding , no rebound. No Bruits. Bowel sounds: normal  RECTAL: N/A GU: N/A  EXTREMETIES: nonedematous. Both Femoral and Pedal pulses are normal.  MUSCULOSKELETAL:  Spine: normal Joints: intact  NEUROLOGIC: oriented to time,place and person; nonfocal. Strength is normal Sensory is normal Reflexes are normal Cranial Nerves are normal.  ASSESSMENT: DM (diabetes mellitus) - Plan: POCT glycosylated hemoglobin (Hb A1C), POCT UA - Microalbumin, COMPLETE METABOLIC PANEL WITH GFR, Microalbumin, urine  HTN (hypertension) - Plan: COMPLETE METABOLIC PANEL WITH GFR  HLD (hyperlipidemia) - Plan: COMPLETE METABOLIC PANEL WITH GFR, NMR Lipoprofile with Lipids  PLAN: Orders Placed This Encounter  Procedures  . COMPLETE METABOLIC PANEL WITH GFR  . NMR Lipoprofile with Lipids  . Microalbumin,  urine  . POCT glycosylated hemoglobin (Hb A1C)  . POCT UA - Microalbumin        Dr Woodroe Mode Recommendations  Diet and Exercise discussed with patient.  For nutrition information, I recommend books:  1).Eat to Live by Dr Monico Hoar. 2).Prevent and Reverse Heart Disease by Dr Suzzette Righter. 3) Dr Katherina Right Book:  Program to Reverse Diabetes  Exercise recommendations are:  If unable to walk, then the patient can exercise in a chair 3 times a day. By flapping arms like a bird gently and raising legs outwards to the front.  If ambulatory, the patient can go for walks for 30 minutes 3 times a week. Then increase the intensity and duration as tolerated.  Goal is to try to attain exercise frequency to 5 times a week.  If applicable: Best to perform resistance exercises (machines or weights) 2 days a week  and cardio type exercises 3 days per week.  Discussed healthy diet and exercise. Also discussed a plant based diet. Reviewed the note from his wife and his Rx.  Return in about 3 months (around 05/14/2013) for Recheck medical problems.  Andi Mahaffy P. [place]esto Charon, M.D.

## 2013-02-12 LAB — MICROALBUMIN, URINE: Microalb, Ur: 1.19 mg/dL (ref 0.00–1.89)

## 2013-02-15 LAB — NMR LIPOPROFILE WITH LIPIDS
Cholesterol, Total: 105 mg/dL (ref <200)
HDL Particle Number: 29.7 umol/L — ABNORMAL LOW (ref >=30.5)
HDL Size: 8.3 nm — ABNORMAL LOW (ref >=9.2)
HDL-C: 35 mg/dL — ABNORMAL LOW (ref >=40)
LDL (calc): 50 mg/dL (ref <100)
LDL Particle Number: 1115 nmol/L — ABNORMAL HIGH (ref <1000)
LDL Size: 19.9 nm — ABNORMAL LOW (ref >20.5)
LP-IR Score: 56 — ABNORMAL HIGH (ref <=45)
Large HDL-P: <1.3 umol/L — ABNORMAL LOW (ref >=4.8)
Large VLDL-P: 1.1 nmol/L (ref <=2.7)
Small LDL Particle Number: 874 nmol/L — ABNORMAL HIGH (ref <=527)
Triglycerides: 99 mg/dL (ref <150)
VLDL Size: 45.7 nm (ref <=46.6)

## 2013-02-15 NOTE — Progress Notes (Signed)
Quick Note:  Lab result at goal. No change in Medications for now. No Change in plans and follow up. ______ 

## 2013-02-23 ENCOUNTER — Other Ambulatory Visit: Payer: Self-pay | Admitting: *Deleted

## 2013-02-23 MED ORDER — GLIMEPIRIDE 2 MG PO TABS: 2.0000 mg | ORAL_TABLET | Freq: Every day | ORAL | Status: DC

## 2013-03-01 ENCOUNTER — Encounter: Payer: Self-pay | Admitting: *Deleted

## 2013-04-18 ENCOUNTER — Other Ambulatory Visit: Payer: Self-pay

## 2013-04-18 MED ORDER — LOSARTAN POTASSIUM 100 MG PO TABS: 100.0000 mg | ORAL_TABLET | Freq: Every day | ORAL | Status: DC

## 2013-05-18 ENCOUNTER — Other Ambulatory Visit: Payer: Self-pay

## 2013-05-18 DIAGNOSIS — E119 Type 2 diabetes mellitus without complications: Secondary | ICD-10-CM

## 2013-05-18 NOTE — Telephone Encounter (Signed)
Last seen 02/11/13  FPW  Last glucose 02/11/13

## 2013-05-19 MED ORDER — LIRAGLUTIDE 18 MG/3ML ~~LOC~~ SOPN: 1.2000 "application " | PEN_INJECTOR | Freq: Every day | SUBCUTANEOUS | Status: DC

## 2013-05-27 ENCOUNTER — Encounter: Payer: Self-pay | Admitting: Family Medicine

## 2013-05-27 ENCOUNTER — Ambulatory Visit (INDEPENDENT_AMBULATORY_CARE_PROVIDER_SITE_OTHER): Payer: BC Managed Care – PPO | Admitting: Family Medicine

## 2013-05-27 VITALS — BP 122/72 | HR 68 | Temp 97.2°F | Ht 69.25 in | Wt 188.8 lb

## 2013-05-27 DIAGNOSIS — I10 Essential (primary) hypertension: Secondary | ICD-10-CM

## 2013-05-27 DIAGNOSIS — E785 Hyperlipidemia, unspecified: Secondary | ICD-10-CM

## 2013-05-27 DIAGNOSIS — Z23 Encounter for immunization: Secondary | ICD-10-CM

## 2013-05-27 DIAGNOSIS — E119 Type 2 diabetes mellitus without complications: Secondary | ICD-10-CM

## 2013-05-27 LAB — POCT GLYCOSYLATED HEMOGLOBIN (HGB A1C): Hemoglobin A1C: 6.2

## 2013-05-27 NOTE — Patient Instructions (Signed)
      Dr Braxton Vantrease's Recommendations  For nutrition information, I recommend books:  1).Eat to Live by Dr Joel Fuhrman. 2).Prevent and Reverse Heart Disease by Dr Caldwell Esselstyn. 3) Dr Neal Barnard's Book:  Program to Reverse Diabetes  Diabetes and Foot Care Diabetes may cause you to have a poor blood supply (circulation) to your legs and feet. Because of this, the skin may be thinner, break easier, and heal more slowly. You also may have nerve damage in your legs and feet causing decreased feeling. You may not notice minor injuries to your feet that could lead to serious problems or infections. Taking care of your feet is one of the most important things you can do for yourself.  HOME CARE INSTRUCTIONS  Do not go barefoot. Bare feet are easily injured.  Check your feet daily for blisters, cuts, and redness.  Wash your feet with warm water (not hot) and mild soap. Pat your feet and between your toes until completely dry.  Apply a moisturizing lotion that does not contain alcohol or petroleum jelly to the dry skin on your feet and to dry brittle toenails. Do not put it between your toes.  Trim your toenails straight across. Do not dig under them or around the cuticle.  Do not cut corns or calluses, or try to remove them with medicine.  Wear clean cotton socks or stockings every day. Make sure they are not too tight. Do not wear knee high stockings since they may decrease blood flow to your legs.  Wear leather shoes that fit properly and have enough cushioning. To break in new shoes, wear them just a few hours a day to avoid injuring your feet.  Wear shoes at all times, even in the house.  Do not cross your legs. This may decrease the blood flow to your feet.  If you find a minor scrape, cut, or break in the skin on your feet, keep it and the skin around it clean and dry. These areas may be cleansed with mild soap and water. Do not use peroxide, alcohol, iodine or  Merthiolate.  When you remove an adhesive bandage, be sure not to harm the skin around it.  If you have a wound, look at it several times a day to make sure it is healing.  Do not use heating pads or hot water bottles. Burns can occur. If you have lost feeling in your feet or legs, you may not know it is happening until it is too late.  Report any cuts, sores or bruises to your caregiver. Do not wait! SEEK MEDICAL CARE IF:   You have an injury that is not healing or you notice redness, numbness, burning, or tingling.  Your feet always feel cold.  You have pain or cramps in your legs and feet. SEEK IMMEDIATE MEDICAL CARE IF:   There is increasing redness, swelling, or increasing pain in the wound.  There is a red line that goes up your leg.  Pus is coming from a wound.  You develop an unexplained oral temperature above 102 F (38.9 C), or as your caregiver suggests.  You notice a bad smell coming from an ulcer or wound. MAKE SURE YOU:   Understand these instructions.  Will watch your condition.  Will get help right away if you are not doing well or get worse. Document Released: 07/11/2000 Document Revised: 10/06/2011 Document Reviewed: 01/17/2009 [employer] Patient Information 2014 [employer].  

## 2013-05-27 NOTE — Progress Notes (Signed)
Patient ID: Barry Smith, male   DOB: [birth year], 62 y.o.   MRN: [id number] SUBJECTIVE: CC: Chief Complaint  Patient presents with  . Follow-up    follow up diabetic ck up    HPI: Patient is here for follow up of Diabetes Mellitus/HTN/HLD: Symptoms evaluated: Denies Nocturia ,Denies Urinary Frequency , denies Blurred vision ,deniesDizziness,denies.Dysuria,denies paresthesias, denies extremity pain or ulcers.Marland Kitchendenies chest pain. has had an annual eye exam.due for his eye exam. do check the feet. Does check CBGs. Average NWG:NFAO Denies episodes of hypoglycemia. Does have an emergency hypoglycemic plan. admits toCompliance with medications. Denies Problems with medications.   Past Medical History  Diagnosis Date  . Anxiety   . Hypertension   . Hyperlipidemia   . Diabetes mellitus without complication    Past Surgical History  Procedure Laterality Date  . Appendectomy     History   Social History  . Marital Status: Single    Spouse Name: N/A    Number of Children: N/A  . Years of Education: N/A   Occupational History  . Not on file.   Social History Main Topics  . Smoking status: Never Smoker   . Smokeless tobacco: Not on file  . Alcohol Use: No  . Drug Use: No  . Sexual Activity: Yes   Other Topics Concern  . Not on file   Social History Narrative  . No narrative on file   Family History  Problem Relation Age of Onset  . Diabetes Father   . Diabetes Sister   . Pneumonia Brother   . Diabetes Sister   . Diabetes Sister   . Diabetes Sister   . Diabetes Sister   . Diabetes Sister   . Cancer Sister   . Diabetes Brother   . Diabetes Brother   . Diabetes Brother   . Diabetes Brother   . Diabetes Brother    Current Outpatient Prescriptions on File Prior to Visit  Medication Sig Dispense Refill  . aspirin 81 MG tablet Take 81 mg by mouth daily.      . cholecalciferol (VITAMIN D) 1000 UNITS tablet Take 1,000 Units by mouth daily.      . Eszopiclone  (ESZOPICLONE) 3 MG TABS Take 3 mg by mouth at bedtime. Take immediately before bedtime      . ezetimibe-simvastatin (VYTORIN) 10-40 MG per tablet Take 1 tablet by mouth at bedtime.      [name] glimepiride (AMARYL) 2 MG tablet Take 1 tablet (2 mg total) by mouth daily before breakfast.  30 tablet  2  . Liraglutide (VICTOZA) 18 MG/3ML SOPN Inject 1.2 application into the skin daily.  3 mL  0  . losartan (COZAAR) 100 MG tablet Take 1 tablet (100 mg total) by mouth daily.  90 tablet  0  . metFORMIN (GLUCOPHAGE) 500 MG tablet Take 2 tabs every am and 3 tabs at bedtime  150 tablet  2  . Multiple Vitamin (MULTIVITAMIN) capsule Take 1 capsule by mouth daily.      . simvastatin (ZOCOR) 20 MG tablet Take 1 tablet (20 mg total) by mouth at bedtime.  90 tablet  3   No current facility-administered medications on file prior to visit.   No Known Allergies Immunization History  Administered Date(s) Administered  . Influenza,inj,Quad PF,36+ Mos 05/27/2013   Prior to Admission medications   Medication Sig Start Date End Date Taking? Authorizing Provider  aspirin 81 MG tablet Take 81 mg by mouth daily.    Historical Provider, MD  cholecalciferol (VITAMIN  D) 1000 UNITS tablet Take 1,000 Units by mouth daily.    Historical Provider, MD  Eszopiclone (ESZOPICLONE) 3 MG TABS Take 3 mg by mouth at bedtime. Take immediately before bedtime    Historical Provider, MD  ezetimibe-simvastatin (VYTORIN) 10-40 MG per tablet Take 1 tablet by mouth at bedtime.    Historical Provider, MD  glimepiride (AMARYL) 2 MG tablet Take 1 tablet (2 mg total) by mouth daily before breakfast. 02/23/13   Ileana Ladd, MD  Liraglutide (VICTOZA) 18 MG/3ML SOPN Inject 1.2 application into the skin daily. 05/18/13   Mary-Margaret Daphine Deutscher, FNP  losartan (COZAAR) 100 MG tablet Take 1 tablet (100 mg total) by mouth daily. 04/18/13   Ileana Ladd, MD  metFORMIN (GLUCOPHAGE) 500 MG tablet Take 2 tabs every am and 3 tabs at bedtime 02/10/13   Ileana Ladd, MD  Multiple Vitamin (MULTIVITAMIN) capsule Take 1 capsule by mouth daily.    Historical Provider, MD  simvastatin (ZOCOR) 20 MG tablet Take 1 tablet (20 mg total) by mouth at bedtime. 01/07/13   Deatra Canter, FNP     ROS: As above in the HPI. All other systems are stable or negative.  OBJECTIVE: APPEARANCE:  Patient in no acute distress.The patient appeared well nourished and normally developed. Acyanotic. Waist: VITAL SIGNS:BP 122/72  Pulse 68  Temp(Src) 97.2 F (36.2 C) (Oral)  Ht 5' 9.25" (1.759 m)  Wt 188 lb 12.8 oz (85.639 kg)  BMI 27.68 kg/m2  AAM  SKIN: warm and  Dry without overt rashes, tattoos and scars  HEAD and Neck: without JVD, Head and scalp: normal Eyes:No scleral icterus. Fundi normal, eye movements normal. Ears: Auricle normal, canal normal, Tympanic membranes normal, insufflation normal. Nose: normal Throat: normal Neck & thyroid: normal  CHEST & LUNGS: Chest wall: normal Lungs: Clear  CVS: Reveals the PMI to be normally located. Regular rhythm, First and Second Heart sounds are normal,  absence of murmurs, rubs or gallops. Peripheral vasculature: Radial pulses: normal Dorsal pedis pulses: normal Posterior pulses: normal  ABDOMEN:  Appearance: normal Benign, no organomegaly, no masses, no Abdominal Aortic enlargement. No Guarding , no rebound. No Bruits. Bowel sounds: normal  RECTAL: N/A GU: N/A  EXTREMETIES: nonedematous.  MUSCULOSKELETAL:  Spine: normal Joints: intact  NEUROLOGIC: oriented to time,place and person; nonfocal. Strength is normal Sensory is normal Reflexes are normal Cranial Nerves are normal. Results for orders placed in visit on 02/11/13  COMPLETE METABOLIC PANEL WITH GFR      Result Value Range   Sodium 138  135 - 145 mEq/L   Potassium 4.6  3.5 - 5.3 mEq/L   Chloride 102  96 - 112 mEq/L   CO2 29  19 - 32 mEq/L   Glucose, Bld 74  70 - 99 mg/dL   BUN 12  6 - 23 mg/dL   Creat 1.61  0.96 - 0.45  mg/dL   Total Bilirubin 0.4  0.3 - 1.2 mg/dL   Alkaline Phosphatase 57  39 - 117 U/L   AST 20  0 - 37 U/L   ALT 22  0 - 53 U/L   Total Protein 7.3  6.0 - 8.3 g/dL   Albumin 5.0  3.5 - 5.2 g/dL   Calcium 9.9  8.4 - 40.9 mg/dL   GFR, Est African American >89     GFR, Est Non African American >89    NMR LIPOPROFILE WITH LIPIDS      Result Value Range   LDL Particle Number  1115 (*) <1000 nmol/L   LDL (calc) 50  <100 mg/dL   HDL-C 35 (*) >=82 mg/dL   Triglycerides 99  <956 mg/dL   Cholesterol, Total 213  <200 mg/dL   HDL Particle Number 08.6 (*) >=30.5 umol/L   Large HDL-P <1.3 (*) >=4.8 umol/L   Large VLDL-P 1.1  <=2.7 nmol/L   Small LDL Particle Number 874 (*) <=527 nmol/L   LDL Size 19.9 (*) >20.5 nm   HDL Size 8.3 (*) >=9.2 nm   VLDL Size 45.7  <=46.6 nm   LP-IR Score 56 (*) <=45  MICROALBUMIN, URINE      Result Value Range   Microalb, Ur 1.19  0.00 - 1.89 mg/dL  POCT GLYCOSYLATED HEMOGLOBIN (HGB A1C)      Result Value Range   Hemoglobin A1C 6.3%    POCT UA - MICROALBUMIN      Result Value Range   Microalbumin Ur, POC positive      See DM foot exam module   ASSESSMENT: DM (diabetes mellitus) - Plan: POCT glycosylated hemoglobin (Hb A1C), CMP14+EGFR  HLD (hyperlipidemia) - Plan: CMP14+EGFR, NMR, lipoprofile  HTN (hypertension) - Plan: CMP14+EGFR  Need for prophylactic vaccination and inoculation against influenza  PLAN:      Dr Woodroe Mode Recommendations  For nutrition information, I recommend books:  1).Eat to Live by Dr Monico Hoar. 2).Prevent and Reverse Heart Disease by Dr Suzzette Righter. 3) Dr Katherina Right Book:  Program to Reverse Diabetes  We are out of Pneumovax today. Exercise program DM eye exam ASAP. Foot Care discussed: handout in the AVS  Orders Placed This Encounter  Procedures  . CMP14+EGFR  . NMR, lipoprofile  . POCT glycosylated hemoglobin (Hb A1C)  same medication regimen.  No orders of the defined types were placed in  this encounter.   There are no discontinued medications. Return in about 5 months (around 10/25/2013) for Recheck medical problems.  Malkia Nippert P. [place]esto Charon, M.D.

## 2013-05-29 LAB — CMP14+EGFR
ALT: 19 IU/L (ref 0–44)
AST: 15 IU/L (ref 0–40)
Albumin/Globulin Ratio: 2 (ref 1.1–2.5)
Albumin: 4.4 g/dL (ref 3.6–4.8)
Alkaline Phosphatase: 54 IU/L (ref 39–117)
BUN/Creatinine Ratio: 11 (ref 10–22)
BUN: 8 mg/dL (ref 8–27)
CO2: 25 mmol/L (ref 18–29)
Calcium: 9.8 mg/dL (ref 8.6–10.2)
Chloride: 102 mmol/L (ref 97–108)
Creatinine, Ser: 0.73 mg/dL — ABNORMAL LOW (ref 0.76–1.27)
GFR calc Af Amer: 115 mL/min/{1.73_m2} (ref >59)
GFR calc non Af Amer: 99 mL/min/{1.73_m2} (ref >59)
Globulin, Total: 2.2 g/dL (ref 1.5–4.5)
Glucose: 123 mg/dL — ABNORMAL HIGH (ref 65–99)
Potassium: 4.7 mmol/L (ref 3.5–5.2)
Sodium: 143 mmol/L (ref 134–144)
Total Bilirubin: 0.2 mg/dL (ref 0.0–1.2)
Total Protein: 6.6 g/dL (ref 6.0–8.5)

## 2013-05-29 LAB — NMR, LIPOPROFILE
Cholesterol: 145 mg/dL (ref <200)
HDL Cholesterol by NMR: 42 mg/dL (ref >=40)
HDL Particle Number: 32.9 umol/L (ref >=30.5)
LDL Particle Number: 1680 nmol/L — ABNORMAL HIGH (ref <1000)
LDL Size: 20 nm — ABNORMAL LOW (ref >20.5)
LDLC SERPL CALC-MCNC: 78 mg/dL (ref <100)
LP-IR Score: 67 — ABNORMAL HIGH (ref <=45)
Small LDL Particle Number: 1192 nmol/L — ABNORMAL HIGH (ref <=527)
Triglycerides by NMR: 123 mg/dL (ref <150)

## 2013-07-19 ENCOUNTER — Other Ambulatory Visit: Payer: Self-pay

## 2013-07-19 MED ORDER — METFORMIN HCL 500 MG PO TABS: ORAL_TABLET | ORAL | Status: DC

## 2013-07-25 ENCOUNTER — Other Ambulatory Visit: Payer: Self-pay | Admitting: *Deleted

## 2013-07-25 MED ORDER — LOSARTAN POTASSIUM 100 MG PO TABS: 100.0000 mg | ORAL_TABLET | Freq: Every day | ORAL | Status: DC

## 2013-08-10 ENCOUNTER — Telehealth: Payer: Self-pay | Admitting: Family Medicine

## 2013-08-10 DIAGNOSIS — E119 Type 2 diabetes mellitus without complications: Secondary | ICD-10-CM

## 2013-08-11 ENCOUNTER — Other Ambulatory Visit: Payer: Self-pay | Admitting: Family Medicine

## 2013-08-11 DIAGNOSIS — E119 Type 2 diabetes mellitus without complications: Secondary | ICD-10-CM

## 2013-08-11 MED ORDER — LIRAGLUTIDE 18 MG/3ML ~~LOC~~ SOPN: 1.2000 mg | PEN_INJECTOR | Freq: Every day | SUBCUTANEOUS | Status: DC

## 2013-08-11 NOTE — Telephone Encounter (Signed)
Call patient : Prescription refilled & sent to pharmacy in [place]. 

## 2013-08-12 ENCOUNTER — Other Ambulatory Visit: Payer: Self-pay | Admitting: Family Medicine

## 2013-08-12 ENCOUNTER — Telehealth: Payer: Self-pay

## 2013-08-12 DIAGNOSIS — E119 Type 2 diabetes mellitus without complications: Secondary | ICD-10-CM

## 2013-08-12 MED ORDER — LIRAGLUTIDE 18 MG/3ML ~~LOC~~ SOPN: 1.2000 "application " | PEN_INJECTOR | Freq: Every day | SUBCUTANEOUS | Status: DC

## 2013-08-12 NOTE — Telephone Encounter (Signed)
Cathy aware

## 2013-08-12 NOTE — Telephone Encounter (Signed)
Wants sample of victoza insulin til gets rx filled Wife aware samples in refrigerator

## 2013-08-15 ENCOUNTER — Other Ambulatory Visit: Payer: Self-pay | Admitting: *Deleted

## 2013-08-15 MED ORDER — METFORMIN HCL 500 MG PO TABS: ORAL_TABLET | ORAL | Status: DC

## 2013-10-06 ENCOUNTER — Other Ambulatory Visit: Payer: Self-pay | Admitting: Family Medicine

## 2013-10-06 ENCOUNTER — Telehealth: Payer: Self-pay | Admitting: Family Medicine

## 2013-10-07 MED ORDER — GLIMEPIRIDE 2 MG PO TABS: 2.0000 mg | ORAL_TABLET | Freq: Every day | ORAL | Status: DC

## 2013-10-07 NOTE — Telephone Encounter (Signed)
DONE

## 2013-10-17 ENCOUNTER — Other Ambulatory Visit: Payer: Self-pay | Admitting: Family Medicine

## 2013-10-17 ENCOUNTER — Encounter: Payer: Self-pay | Admitting: Family Medicine

## 2013-10-27 ENCOUNTER — Ambulatory Visit: Payer: BC Managed Care – PPO | Admitting: Family Medicine

## 2013-10-27 ENCOUNTER — Encounter: Payer: Self-pay | Admitting: Family Medicine

## 2013-10-27 ENCOUNTER — Ambulatory Visit (INDEPENDENT_AMBULATORY_CARE_PROVIDER_SITE_OTHER): Payer: 59 | Admitting: Family Medicine

## 2013-10-27 VITALS — BP 137/72 | HR 71 | Temp 97.3°F | Ht 69.25 in | Wt 186.6 lb

## 2013-10-27 DIAGNOSIS — E785 Hyperlipidemia, unspecified: Secondary | ICD-10-CM

## 2013-10-27 DIAGNOSIS — I10 Essential (primary) hypertension: Secondary | ICD-10-CM

## 2013-10-27 DIAGNOSIS — E119 Type 2 diabetes mellitus without complications: Secondary | ICD-10-CM

## 2013-10-27 LAB — POCT GLYCOSYLATED HEMOGLOBIN (HGB A1C): Hemoglobin A1C: 6.8

## 2013-10-27 NOTE — Progress Notes (Signed)
Patient ID: Barry Smith, male   DOB: 02-25-1951, 63 y.o.   MRN: [id number] SUBJECTIVE: CC: Chief Complaint  Patient presents with  . Hyperlipidemia    4 month recheck  . Hypertension  . Diabetes    HPI:  Patient is here for follow up of Diabetes Mellitus/HLD/HTN: Symptoms evaluated: Denies Nocturia ,Denies Urinary Frequency , denies Blurred vision ,deniesDizziness,denies.Dysuria,denies paresthesias, denies extremity pain or ulcers.Barry Smith chest pain. has had an annual eye exam. do check the feet. Does check CBGs. Average CBG:130 Denies episodes of hypoglycemia. Does have an emergency hypoglycemic plan. admits toCompliance with medications. Denies Problems with medications.  Feels good. No new problems.  Past Medical History  Diagnosis Date  . Anxiety   . Hypertension   . Hyperlipidemia   . Diabetes mellitus without complication    Past Surgical History  Procedure Laterality Date  . Appendectomy     History   Social History  . Marital Status: Single    Spouse Name: N/A    Number of Children: N/A  . Years of Education: N/A   Occupational History  . Not on file.   Social History Main Topics  . Smoking status: Never Smoker   . Smokeless tobacco: Not on file  . Alcohol Use: No  . Drug Use: No  . Sexual Activity: Yes   Other Topics Concern  . Not on file   Social History Narrative  . No narrative on file   Family History  Problem Relation Age of Onset  . Diabetes Father   . Diabetes Sister   . Pneumonia Brother   . Diabetes Sister   . Diabetes Sister   . Diabetes Sister   . Diabetes Sister   . Diabetes Sister   . Cancer Sister   . Diabetes Brother   . Diabetes Brother   . Diabetes Brother   . Diabetes Brother   . Diabetes Brother    Current Outpatient Prescriptions on File Prior to Visit  Medication Sig Dispense Refill  . aspirin 81 MG tablet Take 81 mg by mouth daily.      . cholecalciferol (VITAMIN D) 1000 UNITS tablet Take 1,000 Units by  mouth daily.      . Eszopiclone (ESZOPICLONE) 3 MG TABS Take 3 mg by mouth at bedtime. Take immediately before bedtime      . ezetimibe-simvastatin (VYTORIN) 10-40 MG per tablet Take 1 tablet by mouth at bedtime.      Barry Kitchen glimepiride (AMARYL) 2 MG tablet Take 1 tablet (2 mg total) by mouth daily before breakfast.  30 tablet  0  . Liraglutide (VICTOZA) 18 MG/3ML SOPN Inject 1.2 application into the skin daily.  3 mL  0  . losartan (COZAAR) 100 MG tablet Take 1 tablet (100 mg total) by mouth daily.  90 tablet  0  . metFORMIN (GLUCOPHAGE) 500 MG tablet Take 2 tabs every am and 3 tabs at bedtime  150 tablet  2  . Multiple Vitamin (MULTIVITAMIN) capsule Take 1 capsule by mouth daily.      . simvastatin (ZOCOR) 20 MG tablet Take 1 tablet (20 mg total) by mouth at bedtime.  90 tablet  3   No current facility-administered medications on file prior to visit.   No Known Allergies Immunization History  Administered Date(s) Administered  . Influenza,inj,Quad PF,36+ Mos 05/27/2013   Prior to Admission medications   Medication Sig Start Date End Date Taking? Authorizing Provider  aspirin 81 MG tablet Take 81 mg by mouth daily.   Yes  Historical Provider, MD  cholecalciferol (VITAMIN D) 1000 UNITS tablet Take 1,000 Units by mouth daily.   Yes Historical Provider, MD  Eszopiclone (ESZOPICLONE) 3 MG TABS Take 3 mg by mouth at bedtime. Take immediately before bedtime   Yes Historical Provider, MD  ezetimibe-simvastatin (VYTORIN) 10-40 MG per tablet Take 1 tablet by mouth at bedtime.   Yes Historical Provider, MD  glimepiride (AMARYL) 2 MG tablet Take 1 tablet (2 mg total) by mouth daily before breakfast. 10/07/13  Yes Vernie Shanks, MD  Liraglutide (VICTOZA) 18 MG/3ML SOPN Inject 1.2 application into the skin daily. 08/12/13  Yes Vernie Shanks, MD  losartan (COZAAR) 100 MG tablet Take 1 tablet (100 mg total) by mouth daily. 07/25/13  Yes Vernie Shanks, MD  metFORMIN (GLUCOPHAGE) 500 MG tablet Take 2 tabs every  am and 3 tabs at bedtime 08/15/13  Yes Vernie Shanks, MD  Multiple Vitamin (MULTIVITAMIN) capsule Take 1 capsule by mouth daily.   Yes Historical Provider, MD  simvastatin (ZOCOR) 20 MG tablet Take 1 tablet (20 mg total) by mouth at bedtime. 01/07/13  Yes Lysbeth Penner, FNP     ROS: As above in the HPI. All other systems are stable or negative.  OBJECTIVE: APPEARANCE:  Patient in no acute distress.The patient appeared well nourished and normally developed. Acyanotic. Waist: VITAL SIGNS:BP 137/72  Pulse 71  Temp(Src) 97.3 F (36.3 C) (Oral)  Ht 5' 9.25" (1.759 m)  Wt 186 lb 9.6 oz (84.641 kg)  BMI 27.36 kg/m2  AAM SKIN: warm and  Dry without overt rashes, tattoos and scars  HEAD and Neck: without JVD, Head and scalp: normal Eyes:No scleral icterus. Fundi normal, eye movements normal. Ears: Auricle normal, canal normal, Tympanic membranes normal, insufflation normal. Nose: normal Throat: normal Neck & thyroid: normal  CHEST & LUNGS: Chest wall: normal Lungs: Clear  CVS: Reveals the PMI to be normally located. Regular rhythm, First and Second Heart sounds are normal,  absence of murmurs, rubs or gallops. Peripheral vasculature: Radial pulses: normal Dorsal pedis pulses: normal Posterior pulses: normal  ABDOMEN:  Appearance: normal Benign, no organomegaly, no masses, no Abdominal Aortic enlargement. No Guarding , no rebound. No Bruits. Bowel sounds: normal  RECTAL: N/A GU: N/A  EXTREMETIES: nonedematous.  MUSCULOSKELETAL:  Spine: normal Joints: intact  NEUROLOGIC: oriented to time,place and person; nonfocal. Strength is normal Sensory is normal Reflexes are normal Cranial Nerves are normal.  Results for orders placed in visit on 05/27/13  CMP14+EGFR      Result Value Ref Range   Glucose 123 (*) 65 - 99 mg/dL   BUN 8  8 - 27 mg/dL   Creatinine, Ser 0.73 (*) 0.76 - 1.27 mg/dL   GFR calc non Af Amer 99  >59 mL/min/1.73   GFR calc Af Amer 115  >59  mL/min/1.73   BUN/Creatinine Ratio 11  10 - 22   Sodium 143  134 - 144 mmol/L   Potassium 4.7  3.5 - 5.2 mmol/L   Chloride 102  97 - 108 mmol/L   CO2 25  18 - 29 mmol/L   Calcium 9.8  8.6 - 10.2 mg/dL   Total Protein 6.6  6.0 - 8.5 g/dL   Albumin 4.4  3.6 - 4.8 g/dL   Globulin, Total 2.2  1.5 - 4.5 g/dL   Albumin/Globulin Ratio 2.0  1.1 - 2.5   Total Bilirubin 0.2  0.0 - 1.2 mg/dL   Alkaline Phosphatase 54  39 - 117 IU/L  AST 15  0 - 40 IU/L   ALT 19  0 - 44 IU/L  NMR, LIPOPROFILE      Result Value Ref Range   LDL Particle Number 1680 (*) <1000 nmol/L   LDLC SERPL CALC-MCNC 78  <100 mg/dL   HDL Cholesterol by NMR 42  >=40 mg/dL   Triglycerides by NMR 123  <150 mg/dL   Cholesterol 145  <200 mg/dL   HDL Particle Number 32.9  >=30.5 umol/L   Small LDL Particle Number 1192 (*) <=527 nmol/L   LDL Size 20.0 (*) >20.5 nm   LP-IR Score 67 (*) <=45  POCT GLYCOSYLATED HEMOGLOBIN (HGB A1C)      Result Value Ref Range   Hemoglobin A1C 6.2%      ASSESSMENT:  HTN (hypertension) - Plan: CMP14+EGFR  HLD (hyperlipidemia) - Plan: CMP14+EGFR, Lipid panel  DM (diabetes mellitus) - Plan: POCT glycosylated hemoglobin (Hb A1C)  PLAN: DM footcare in the AVS       Dr Paula Libra Recommendations  For nutrition information, I recommend books:  1).Eat to Live by Dr Excell Seltzer. 2).Prevent and Reverse Heart Disease by Dr Karl Luke. 3) Dr Janene Harvey Book:  Program to Reverse Diabetes  Exercise recommendations are:  If unable to walk, then the patient can exercise in a chair 3 times a day. By flapping arms like a bird gently and raising legs outwards to the front.  If ambulatory, the patient can go for walks for 30 minutes 3 times a week. Then increase the intensity and duration as tolerated.  Goal is to try to attain exercise frequency to 5 times a week.  If applicable: Best to perform resistance exercises (machines or weights) 2 days a week and cardio type exercises 3  days per week.  Wellness reviewed. Patient will be check ing with his insurance in regards to his immunization benefits.  Orders Placed This Encounter  Procedures  . CMP14+EGFR  . Lipid panel  . POCT glycosylated hemoglobin (Hb A1C)   No orders of the defined types were placed in this encounter.   There are no discontinued medications. Return in about 4 months (around 02/26/2014) for Recheck medical problems.  Mickey Hebel P. Jacelyn Grip, M.D.

## 2013-10-27 NOTE — Patient Instructions (Signed)
Diabetes and Foot Care Diabetes may cause you to have problems because of poor blood supply (circulation) to your feet and legs. This may cause the skin on your feet to become thinner, break easier, and heal more slowly. Your skin may become dry, and the skin may peel and crack. You may also have nerve damage in your legs and feet causing decreased feeling in them. You may not notice minor injuries to your feet that could lead to infections or more serious problems. Taking care of your feet is one of the most important things you can do for yourself.  HOME CARE INSTRUCTIONS  Wear shoes at all times, even in the house. Do not go barefoot. Bare feet are easily injured.  Check your feet daily for blisters, cuts, and redness. If you cannot see the bottom of your feet, use a mirror or ask someone for help.  Wash your feet with warm water (do not use hot water) and mild soap. Then pat your feet and the areas between your toes until they are completely dry. Do not soak your feet as this can dry your skin.  Apply a moisturizing lotion or petroleum jelly (that does not contain alcohol and is unscented) to the skin on your feet and to dry, brittle toenails. Do not apply lotion between your toes.  Trim your toenails straight across. Do not dig under them or around the cuticle. File the edges of your nails with an emery board or nail file.  Do not cut corns or calluses or try to remove them with medicine.  Wear clean socks or stockings every day. Make sure they are not too tight. Do not wear knee-high stockings since they may decrease blood flow to your legs.  Wear shoes that fit properly and have enough cushioning. To break in new shoes, wear them for just a few hours a day. This prevents you from injuring your feet. Always look in your shoes before you put them on to be sure there are no objects inside.  Do not cross your legs. This may decrease the blood flow to your feet.  If you find a minor scrape,  cut, or break in the skin on your feet, keep it and the skin around it clean and dry. These areas may be cleansed with mild soap and water. Do not cleanse the area with peroxide, alcohol, or iodine.  When you remove an adhesive bandage, be sure not to damage the skin around it.  If you have a wound, look at it several times a day to make sure it is healing.  Do not use heating pads or hot water bottles. They may burn your skin. If you have lost feeling in your feet or legs, you may not know it is happening until it is too late.  Make sure your health care provider performs a complete foot exam at least annually or more often if you have foot problems. Report any cuts, sores, or bruises to your health care provider immediately. SEEK MEDICAL CARE IF:   You have an injury that is not healing.  You have cuts or breaks in the skin.  You have an ingrown nail.  You notice redness on your legs or feet.  You feel burning or tingling in your legs or feet.  You have pain or cramps in your legs and feet.  Your legs or feet are numb.  Your feet always feel cold. SEEK IMMEDIATE MEDICAL CARE IF:   There is increasing redness,   swelling, or pain in or around a wound.  There is a red line that goes up your leg.  Pus is coming from a wound.  You develop a fever or as directed by your health care provider.  You notice a bad smell coming from an ulcer or wound. Document Released: 07/11/2000 Document Revised: 03/16/2013 Document Reviewed: 12/21/2012 [employer] Patient Information 2014 [employer].        Dr Floetta Brickey's Recommendations  For nutrition information, I recommend books:  1).Eat to Live by Dr Joel Fuhrman. 2).Prevent and Reverse Heart Disease by Dr Caldwell Esselstyn. 3) Dr Neal Barnard's Book:  Program to Reverse Diabetes  Exercise recommendations are:  If unable to walk, then the patient can exercise in a chair 3 times a day. By flapping arms like a bird gently and  raising legs outwards to the front.  If ambulatory, the patient can go for walks for 30 minutes 3 times a week. Then increase the intensity and duration as tolerated.  Goal is to try to attain exercise frequency to 5 times a week.  If applicable: Best to perform resistance exercises (machines or weights) 2 days a week and cardio type exercises 3 days per week.  

## 2013-10-28 LAB — CMP14+EGFR
ALT: 19 IU/L (ref 0–44)
AST: 18 IU/L (ref 0–40)
Albumin/Globulin Ratio: 1.9 (ref 1.1–2.5)
Albumin: 4.6 g/dL (ref 3.6–4.8)
Alkaline Phosphatase: 66 IU/L (ref 39–117)
BUN/Creatinine Ratio: 10 (ref 10–22)
BUN: 9 mg/dL (ref 8–27)
CO2: 25 mmol/L (ref 18–29)
Calcium: 9.5 mg/dL (ref 8.6–10.2)
Chloride: 104 mmol/L (ref 97–108)
Creatinine, Ser: 0.88 mg/dL (ref 0.76–1.27)
GFR calc Af Amer: 106 mL/min/{1.73_m2} (ref >59)
GFR calc non Af Amer: 92 mL/min/{1.73_m2} (ref >59)
Globulin, Total: 2.4 g/dL (ref 1.5–4.5)
Glucose: 131 mg/dL — ABNORMAL HIGH (ref 65–99)
Potassium: 4 mmol/L (ref 3.5–5.2)
Sodium: 144 mmol/L (ref 134–144)
Total Bilirubin: 0.4 mg/dL (ref 0.0–1.2)
Total Protein: 7 g/dL (ref 6.0–8.5)

## 2013-10-28 LAB — LIPID PANEL
Chol/HDL Ratio: 4 ratio units (ref 0.0–5.0)
Cholesterol, Total: 149 mg/dL (ref 100–199)
HDL: 37 mg/dL — ABNORMAL LOW (ref >39)
LDL Calculated: 84 mg/dL (ref 0–99)
Triglycerides: 138 mg/dL (ref 0–149)
VLDL Cholesterol Cal: 28 mg/dL (ref 5–40)

## 2013-11-01 ENCOUNTER — Telehealth: Payer: Self-pay | Admitting: Family Medicine

## 2013-11-01 NOTE — Telephone Encounter (Signed)
Discussed results with patient's wife.

## 2013-11-02 ENCOUNTER — Other Ambulatory Visit: Payer: Self-pay | Admitting: Family Medicine

## 2013-11-07 ENCOUNTER — Ambulatory Visit: Payer: BC Managed Care – PPO

## 2013-11-17 ENCOUNTER — Other Ambulatory Visit: Payer: Self-pay | Admitting: Family Medicine

## 2013-11-29 ENCOUNTER — Telehealth: Payer: Self-pay | Admitting: *Deleted

## 2013-11-29 ENCOUNTER — Other Ambulatory Visit: Payer: Self-pay | Admitting: *Deleted

## 2013-11-29 NOTE — Telephone Encounter (Signed)
No complaints of N/V. Mainly complains of cough and fever.  He isn't able to come in for an appt now but will schedule one or go to the ER if symptoms persist or worsen.

## 2013-11-29 NOTE — Telephone Encounter (Signed)
Patient DM and out of meds and nausea and vomiting. Needs to be  Evaluated in the ED setting stat to ensure he isn't in serious trouble with his DM. Thanks. FW

## 2013-11-29 NOTE — Telephone Encounter (Signed)
Lynden AngCathy called to say that Barry Smith is out of his victoza and has been for several days or maybe weeks, he is not feeling well.  We do not have any samples, I gave her [facility]'s # to see if she can get them help paying for victoza, she says they do not have 600.dollars to pay for it.  I advised her to make an appt for him to be seen immed as he is nauseated and vomiting.  She says he has not checked his BS level since being seen by Dr in April.

## 2013-12-02 ENCOUNTER — Ambulatory Visit (INDEPENDENT_AMBULATORY_CARE_PROVIDER_SITE_OTHER): Payer: 59 | Admitting: Family Medicine

## 2013-12-02 ENCOUNTER — Encounter: Payer: Self-pay | Admitting: Family Medicine

## 2013-12-02 VITALS — BP 123/72 | HR 94 | Temp 97.0°F | Ht 69.25 in | Wt 182.4 lb

## 2013-12-02 DIAGNOSIS — J189 Pneumonia, unspecified organism: Secondary | ICD-10-CM

## 2013-12-02 NOTE — Progress Notes (Signed)
   Subjective:    Patient ID: Barry Smith, male    DOB: 16-May-1951, 63 y.o.   MRN: [id number]  HPI  This 63 y.o. male presents for evaluation [facility].  He has bronchitis and CAP.  He has been rx'd zpak, hycodan syrup, and albuterol neb.  He has not had the meds filled.  Review of Systems    No chest pain, SOB, HA, dizziness, vision change, N/V, diarrhea, constipation, dysuria, urinary urgency or frequency, myalgias, arthralgias or rash.  Objective:   Physical Exam  Vital signs noted  Well developed well nourished male.  HEENT - Head atraumatic Normocephalic                Eyes - PERRLA, Conjuctiva - clear Sclera- Clear EOMI                Ears - EAC's Wnl TM's Wnl Gross Hearing WNL                Throat - oropharanx wnl Respiratory - Lungs CTA bilateral Cardiac - RRR S1 and S2 without murmur GI - Abdomen soft Nontender and bowel sounds active x 4       Assessment & Plan:  CAP (community acquired pneumonia) Continue zpak, hycodan, and MDI Push po fluids, rest, tylenol and motrin otc prn as directed for fever, arthralgias, and myalgias.  Follow up prn if sx's continue or persist.  Follow up prn  Deatra CanterWilliam J Oxford FNP

## 2013-12-18 ENCOUNTER — Other Ambulatory Visit: Payer: Self-pay | Admitting: *Deleted

## 2013-12-18 DIAGNOSIS — E119 Type 2 diabetes mellitus without complications: Secondary | ICD-10-CM

## 2013-12-18 MED ORDER — LIRAGLUTIDE 18 MG/3ML ~~LOC~~ SOPN: 1.2000 "application " | PEN_INJECTOR | Freq: Every day | SUBCUTANEOUS | Status: DC

## 2013-12-21 ENCOUNTER — Other Ambulatory Visit: Payer: Self-pay | Admitting: *Deleted

## 2013-12-21 MED ORDER — LOSARTAN POTASSIUM 100 MG PO TABS: 100.0000 mg | ORAL_TABLET | Freq: Every day | ORAL | Status: DC

## 2014-02-02 ENCOUNTER — Encounter: Payer: Self-pay | Admitting: Family

## 2014-02-02 ENCOUNTER — Ambulatory Visit (INDEPENDENT_AMBULATORY_CARE_PROVIDER_SITE_OTHER): Payer: 59 | Admitting: Family

## 2014-02-02 VITALS — BP 125/73 | HR 75 | Temp 97.4°F | Ht 69.0 in | Wt 185.2 lb

## 2014-02-02 DIAGNOSIS — E559 Vitamin D deficiency, unspecified: Secondary | ICD-10-CM | POA: Insufficient documentation

## 2014-02-02 DIAGNOSIS — E785 Hyperlipidemia, unspecified: Secondary | ICD-10-CM

## 2014-02-02 DIAGNOSIS — E119 Type 2 diabetes mellitus without complications: Secondary | ICD-10-CM

## 2014-02-02 DIAGNOSIS — Z125 Encounter for screening for malignant neoplasm of prostate: Secondary | ICD-10-CM

## 2014-02-02 DIAGNOSIS — I10 Essential (primary) hypertension: Secondary | ICD-10-CM

## 2014-02-02 LAB — POCT GLYCOSYLATED HEMOGLOBIN (HGB A1C): Hemoglobin A1C: 7.1

## 2014-02-02 MED ORDER — METFORMIN HCL 500 MG PO TABS: ORAL_TABLET | ORAL | Status: DC

## 2014-02-02 MED ORDER — LIRAGLUTIDE 18 MG/3ML ~~LOC~~ SOPN: 1.2000 "application " | PEN_INJECTOR | Freq: Every day | SUBCUTANEOUS | Status: DC

## 2014-02-02 MED ORDER — GLIMEPIRIDE 2 MG PO TABS: ORAL_TABLET | ORAL | Status: DC

## 2014-02-02 MED ORDER — LOSARTAN POTASSIUM 100 MG PO TABS: 100.0000 mg | ORAL_TABLET | Freq: Every day | ORAL | Status: DC

## 2014-02-02 NOTE — Patient Instructions (Signed)

## 2014-02-02 NOTE — Progress Notes (Signed)
Subjective:    Patient ID: Barry Derry., male    DOB: 1951-07-27, 63 y.o.   MRN: [id number]  Hypertension This is a chronic problem. The current episode started more than 1 year ago. The problem has been resolved since onset. The problem is controlled. Pertinent negatives include no anxiety, blurred vision, headaches, palpitations, peripheral edema or shortness of breath. Risk factors for coronary artery disease include diabetes mellitus, dyslipidemia and male gender. Past treatments include angiotensin blockers. The current treatment provides significant improvement. There is no history of kidney disease, CAD/MI, CVA, heart failure or a thyroid problem. There is no history of sleep apnea.  Diabetes He presents for his follow-up diabetic visit. He has type 2 diabetes mellitus. His disease course has been stable. Pertinent negatives for hypoglycemia include no confusion, dizziness or headaches. Pertinent negatives for diabetes include no blurred vision, no foot paresthesias, no foot ulcerations and no visual change. Pertinent negatives for hypoglycemia complications include no blackouts. Pertinent negatives for diabetic complications include no CVA, heart disease or peripheral neuropathy. Current diabetic treatment includes oral agent (triple therapy). He is compliant with treatment all of the time. His weight is stable. He is following a generally unhealthy diet. He rarely participates in exercise. His breakfast blood glucose range is generally 130-140 mg/dl. An ACE inhibitor/angiotensin II receptor blocker is not being taken. Eye exam is current.  Hyperlipidemia This is a chronic problem. The current episode started more than 1 year ago. The problem is controlled. Recent lipid tests were reviewed and are normal. Exacerbating diseases include diabetes. He has no history of hypothyroidism. Pertinent negatives include no leg pain, myalgias or shortness of breath. Current antihyperlipidemic  treatment includes statins. The current treatment provides significant improvement of lipids. Risk factors for coronary artery disease include diabetes mellitus, dyslipidemia, hypertension and male sex.      Review of Systems  Constitutional: Negative.   HENT: Negative.   Eyes: Negative for blurred vision.  Respiratory: Negative.  Negative for shortness of breath.   Cardiovascular: Negative.  Negative for palpitations.  Gastrointestinal: Negative.   Endocrine: Negative.   Genitourinary: Negative.   Musculoskeletal: Negative.  Negative for myalgias.  Neurological: Negative.  Negative for dizziness and headaches.  Hematological: Negative.   Psychiatric/Behavioral: Negative.  Negative for confusion.  All other systems reviewed and are negative.      Objective:   Physical Exam  Vitals reviewed. Constitutional: He is oriented to person, place, and time. He appears well-developed and well-nourished. No distress.  HENT:  Head: Normocephalic.  Right Ear: External ear normal.  Left Ear: External ear normal.  Mouth/Throat: Oropharynx is clear and moist.  Eyes: Pupils are equal, round, and reactive to light. Right eye exhibits no discharge. Left eye exhibits no discharge.  Neck: Normal range of motion. Neck supple. No thyromegaly present.  Cardiovascular: Normal rate, regular rhythm, normal heart sounds and intact distal pulses.   No murmur heard. Pulmonary/Chest: Effort normal and breath sounds normal. No respiratory distress. He has no wheezes.  Abdominal: Soft. Bowel sounds are normal. He exhibits no distension. There is no tenderness.  Musculoskeletal: Normal range of motion. He exhibits no edema and no tenderness.  Neurological: He is alert and oriented to person, place, and time. He has normal reflexes. No cranial nerve deficit.  Skin: Skin is warm and dry. No rash noted. No erythema.  Psychiatric: He has a normal mood and affect. His behavior is normal. Judgment and thought  content normal.  BP 125/73  Pulse 75  Temp(Src) 97.4 F (36.3 C) (Oral)  Ht 5' 9"  (1.753 m)  Wt 185 lb 3.2 oz (84.006 kg)  BMI 27.34 kg/m2     Assessment & Plan:  1. Essential hypertension - CMP14+EGFR  2. Type 2 diabetes mellitus without complication - POCT glycosylated hemoglobin (Hb A1C) - Liraglutide (VICTOZA) 18 MG/3ML SOPN; Inject 1.2 application into the skin daily.  Dispense: 3 mL; Refill: 6  3. HLD (hyperlipidemia) - Lipid panel - losartan (COZAAR) 100 MG tablet; Take 1 tablet (100 mg total) by mouth daily.  Dispense: 90 tablet; Refill: 2 - glimepiride (AMARYL) 2 MG tablet; TAKE ONE TABLET EACH MORNING BEFORE BREAKFAST  Dispense: 30 tablet; Refill: 6 - metFORMIN (GLUCOPHAGE) 500 MG tablet; TAKE 2 TABLETS EVERY MORNING AND 3 TABLETS AT BEDTIME  Dispense: 150 tablet; Refill: 6  4. Unspecified vitamin D deficiency - Vit D  25 hydroxy (rtn osteoporosis monitoring)  5. Prostate cancer screening - PSA, total and free   Continue all meds Labs pending Health Maintenance reviewed Diet and exercise encouraged RTO 4 months  Evelina Dun, FNP

## 2014-02-03 LAB — CMP14+EGFR
A/G RATIO: 2.4 (ref 1.1–2.5)
ALT: 25 IU/L (ref 0–44)
AST: 26 IU/L (ref 0–40)
Albumin: 4.5 g/dL (ref 3.6–4.8)
Alkaline Phosphatase: 55 IU/L (ref 39–117)
BUN/Creatinine Ratio: 13 (ref 10–22)
BUN: 11 mg/dL (ref 8–27)
CO2: 23 mmol/L (ref 18–29)
Calcium: 9.3 mg/dL (ref 8.6–10.2)
Chloride: 101 mmol/L (ref 97–108)
Creatinine, Ser: 0.84 mg/dL (ref 0.76–1.27)
GFR calc Af Amer: 108 mL/min/{1.73_m2} (ref >59)
GFR, EST NON AFRICAN AMERICAN: 93 mL/min/{1.73_m2} (ref >59)
GLUCOSE: 126 mg/dL — AB (ref 65–99)
Globulin, Total: 1.9 g/dL (ref 1.5–4.5)
Potassium: 4.1 mmol/L (ref 3.5–5.2)
Sodium: 143 mmol/L (ref 134–144)
TOTAL PROTEIN: 6.4 g/dL (ref 6.0–8.5)
Total Bilirubin: 0.4 mg/dL (ref 0.0–1.2)

## 2014-02-03 LAB — LIPID PANEL
CHOL/HDL RATIO: 3.7 ratio (ref 0.0–5.0)
CHOLESTEROL TOTAL: 153 mg/dL (ref 100–199)
HDL: 41 mg/dL (ref >39)
LDL Calculated: 91 mg/dL (ref 0–99)
TRIGLYCERIDES: 103 mg/dL (ref 0–149)
VLDL Cholesterol Cal: 21 mg/dL (ref 5–40)

## 2014-02-03 LAB — PSA, TOTAL AND FREE
PSA FREE PCT: 17.8 %
PSA FREE: 0.16 ng/mL
PSA: 0.9 ng/mL (ref 0.0–4.0)

## 2014-02-03 LAB — VITAMIN D 25 HYDROXY (VIT D DEFICIENCY, FRACTURES): Vit D, 25-Hydroxy: 31.7 ng/mL (ref 30.0–100.0)

## 2014-02-07 ENCOUNTER — Telehealth: Payer: Self-pay | Admitting: Family

## 2014-02-08 NOTE — Telephone Encounter (Signed)
Increase Vit D to BID

## 2014-02-08 NOTE — Telephone Encounter (Signed)
Notified. 

## 2014-02-09 ENCOUNTER — Telehealth: Payer: Self-pay | Admitting: Family

## 2014-02-09 NOTE — Telephone Encounter (Signed)
Message copied by Doreatha MassedMOORE, MITZI on Thu Feb 09, 2014  8:41 AM ------      Message from: Lendon ColonelHAWKS, [place] A      Created: Mon Feb 06, 2014  9:26 AM       Hbg A1C slightly elevated- Need to be on low carb diet and exercise      Kidney and liver function stable      Cholesterol levels WNL      PSA levels WNL      Vit D levels on low side of normal-Would benefit from Vit D OTC ------

## 2014-02-10 ENCOUNTER — Encounter: Payer: Self-pay | Admitting: Family

## 2014-02-10 NOTE — Telephone Encounter (Signed)
Letter sent.

## 2014-02-27 ENCOUNTER — Telehealth: Payer: Self-pay | Admitting: Family

## 2014-02-27 DIAGNOSIS — E119 Type 2 diabetes mellitus without complications: Secondary | ICD-10-CM

## 2014-02-27 MED ORDER — LIRAGLUTIDE 18 MG/3ML ~~LOC~~ SOPN: 1.2000 "application " | PEN_INJECTOR | Freq: Every day | SUBCUTANEOUS | Status: DC

## 2014-02-27 NOTE — Telephone Encounter (Signed)
done

## 2014-03-01 ENCOUNTER — Other Ambulatory Visit: Payer: Self-pay | Admitting: Family

## 2014-04-27 ENCOUNTER — Other Ambulatory Visit: Payer: Self-pay | Admitting: Family Medicine

## 2014-06-06 ENCOUNTER — Ambulatory Visit: Payer: 59 | Admitting: Family

## 2014-08-07 ENCOUNTER — Ambulatory Visit: Payer: 59 | Admitting: Family

## 2014-08-09 ENCOUNTER — Other Ambulatory Visit: Payer: Self-pay | Admitting: Family

## 2014-08-10 ENCOUNTER — Ambulatory Visit (INDEPENDENT_AMBULATORY_CARE_PROVIDER_SITE_OTHER): Payer: 59 | Admitting: Family

## 2014-08-10 ENCOUNTER — Encounter: Payer: Self-pay | Admitting: Family

## 2014-08-10 VITALS — BP 133/87 | HR 68 | Temp 96.8°F | Ht 69.0 in | Wt 184.6 lb

## 2014-08-10 DIAGNOSIS — I10 Essential (primary) hypertension: Secondary | ICD-10-CM

## 2014-08-10 DIAGNOSIS — E785 Hyperlipidemia, unspecified: Secondary | ICD-10-CM

## 2014-08-10 DIAGNOSIS — E119 Type 2 diabetes mellitus without complications: Secondary | ICD-10-CM

## 2014-08-10 DIAGNOSIS — E559 Vitamin D deficiency, unspecified: Secondary | ICD-10-CM

## 2014-08-10 DIAGNOSIS — Z23 Encounter for immunization: Secondary | ICD-10-CM

## 2014-08-10 LAB — POCT UA - MICROALBUMIN: Microalbumin Ur, POC: 50 mg/L

## 2014-08-10 LAB — POCT GLYCOSYLATED HEMOGLOBIN (HGB A1C): HEMOGLOBIN A1C: 6.7

## 2014-08-10 MED ORDER — LOSARTAN POTASSIUM 100 MG PO TABS: 100.0000 mg | ORAL_TABLET | Freq: Every day | ORAL | Status: DC

## 2014-08-10 MED ORDER — VITAMIN D 1000 UNITS PO TABS: 1000.0000 [IU] | ORAL_TABLET | Freq: Every day | ORAL | Status: AC

## 2014-08-10 MED ORDER — LIRAGLUTIDE 18 MG/3ML ~~LOC~~ SOPN: PEN_INJECTOR | SUBCUTANEOUS | Status: DC

## 2014-08-10 MED ORDER — GLIMEPIRIDE 2 MG PO TABS: ORAL_TABLET | ORAL | Status: DC

## 2014-08-10 MED ORDER — METFORMIN HCL 500 MG PO TABS: ORAL_TABLET | ORAL | Status: DC

## 2014-08-10 MED ORDER — SIMVASTATIN 20 MG PO TABS: 20.0000 mg | ORAL_TABLET | Freq: Every day | ORAL | Status: DC

## 2014-08-10 NOTE — Patient Instructions (Signed)

## 2014-08-10 NOTE — Progress Notes (Signed)
Subjective:    Patient ID: Barry Smith., male    DOB: 02-05-1951, 64 y.o.   MRN: [id number]  Diabetes He presents for his follow-up diabetic visit. He has type 2 diabetes mellitus. His disease course has been stable. Pertinent negatives for hypoglycemia include no confusion, dizziness or headaches. Pertinent negatives for diabetes include no blurred vision, no foot paresthesias, no foot ulcerations and no visual change. Pertinent negatives for hypoglycemia complications include no blackouts. Pertinent negatives for diabetic complications include no CVA, heart disease or peripheral neuropathy. Current diabetic treatment includes oral agent (triple therapy). He is compliant with treatment all of the time. His weight is stable. He is following a generally unhealthy diet. He rarely participates in exercise. (Pt states he does not ever check it ) An ACE inhibitor/angiotensin II receptor blocker is being taken. Eye exam is not current.  Hypertension This is a chronic problem. The current episode started more than 1 year ago. The problem has been resolved since onset. The problem is controlled. Pertinent negatives include no anxiety, blurred vision, headaches, palpitations, peripheral edema or shortness of breath. Risk factors for coronary artery disease include diabetes mellitus, dyslipidemia and male gender. Past treatments include angiotensin blockers. The current treatment provides significant improvement. There is no history of kidney disease, CAD/MI, CVA, heart failure or a thyroid problem. There is no history of sleep apnea.  Hyperlipidemia This is a chronic problem. The current episode started more than 1 year ago. The problem is controlled. Recent lipid tests were reviewed and are normal. Exacerbating diseases include diabetes. He has no history of hypothyroidism. Pertinent negatives include no leg pain, myalgias or shortness of breath. Current antihyperlipidemic treatment includes statins.  The current treatment provides significant improvement of lipids. Risk factors for coronary artery disease include diabetes mellitus, dyslipidemia, hypertension and male sex.      Review of Systems  Constitutional: Negative.   HENT: Negative.   Eyes: Negative for blurred vision.  Respiratory: Negative.  Negative for shortness of breath.   Cardiovascular: Negative.  Negative for palpitations.  Gastrointestinal: Negative.   Endocrine: Negative.   Genitourinary: Negative.   Musculoskeletal: Negative.  Negative for myalgias.  Neurological: Negative.  Negative for dizziness and headaches.  Hematological: Negative.   Psychiatric/Behavioral: Negative.  Negative for confusion.  All other systems reviewed and are negative.      Objective:   Physical Exam  Constitutional: He is oriented to person, place, and time. He appears well-developed and well-nourished. No distress.  HENT:  Head: Normocephalic.  Right Ear: External ear normal.  Left Ear: External ear normal.  Nose: Nose normal.  Mouth/Throat: Oropharynx is clear and moist.  Eyes: Pupils are equal, round, and reactive to light. Right eye exhibits no discharge. Left eye exhibits no discharge.  Neck: Normal range of motion. Neck supple. No thyromegaly present.  Cardiovascular: Normal rate, regular rhythm, normal heart sounds and intact distal pulses.   No murmur heard. Pulmonary/Chest: Effort normal and breath sounds normal. No respiratory distress. He has no wheezes.  Abdominal: Soft. Bowel sounds are normal. He exhibits no distension. There is no tenderness.  Musculoskeletal: Normal range of motion. He exhibits no edema or tenderness.  Neurological: He is alert and oriented to person, place, and time. He has normal reflexes. No cranial nerve deficit.  Skin: Skin is warm and dry. No rash noted. No erythema.  Psychiatric: He has a normal mood and affect. His behavior is normal. Judgment and thought content normal.  Vitals  reviewed.  BP 133/87 mmHg  Pulse 68  Temp(Src) 96.8 F (36 C) (Oral)  Ht 5' 9"  (1.753 m)  Wt 184 lb 9.6 oz (83.734 kg)  BMI 27.25 kg/m2  See Diabetic Note    Assessment & Plan:  1. Essential hypertension - CMP14+EGFR - losartan (COZAAR) 100 MG tablet; Take 1 tablet (100 mg total) by mouth daily.  Dispense: 90 tablet; Refill: 3  2. Type 2 diabetes mellitus without complication -Pt has not been check BS at home- Pt states RX of Victoza was $500 and had only been taking it "every couple of days as needed"- Coupon card given-May need to  Adjust?  POCT glycosylated hemoglobin (Hb A1C) - CMP14+EGFR - POCT UA - Microalbumin - glimepiride (AMARYL) 2 MG tablet; TAKE ONE TABLET EACH MORNING BEFORE BREAKFAST  Dispense: 90 tablet; Refill: 3 - metFORMIN (GLUCOPHAGE) 500 MG tablet; TAKE 2 TABLETS EVERY MORNING AND 3 TABLETS AT BEDTIME  Dispense: 450 tablet; Refill: 3 - Liraglutide (VICTOZA) 18 MG/3ML SOPN; INJECT DAILY  Dispense: 9 mL; Refill: 3  3. Vitamin D deficiency - CMP14+EGFR - Vit D  25 hydroxy (rtn osteoporosis monitoring) - cholecalciferol (VITAMIN D) 1000 UNITS tablet; Take 1 tablet (1,000 Units total) by mouth daily.  Dispense: 90 tablet; Refill: 3  4. HLD (hyperlipidemia) - CMP14+EGFR - Lipid panel - simvastatin (ZOCOR) 20 MG tablet; Take 1 tablet (20 mg total) by mouth at bedtime.  Dispense: 90 tablet; Refill: 3  5. Encounter for immunization   Continue all meds Labs pending Health Maintenance reviewed-Pneumonia and Flu vaccine given today Diet and exercise encouraged RTO 6 months  Evelina Dun, FNP

## 2014-08-10 NOTE — Addendum Note (Signed)
Addended by: Orma RenderHODGES, Almeda Ezra F on: 08/10/2014 10:34 AM   Modules accepted: Orders

## 2014-08-11 LAB — CMP14+EGFR
ALK PHOS: 58 IU/L (ref 39–117)
ALT: 15 IU/L (ref 0–44)
AST: 17 IU/L (ref 0–40)
Albumin/Globulin Ratio: 1.8 (ref 1.1–2.5)
Albumin: 4.7 g/dL (ref 3.6–4.8)
BUN/Creatinine Ratio: 13 (ref 10–22)
BUN: 11 mg/dL (ref 8–27)
CALCIUM: 9.6 mg/dL (ref 8.6–10.2)
CHLORIDE: 104 mmol/L (ref 97–108)
CO2: 25 mmol/L (ref 18–29)
Creatinine, Ser: 0.83 mg/dL (ref 0.76–1.27)
GFR calc Af Amer: 108 mL/min/{1.73_m2} (ref >59)
GFR calc non Af Amer: 94 mL/min/{1.73_m2} (ref >59)
Globulin, Total: 2.6 g/dL (ref 1.5–4.5)
Glucose: 74 mg/dL (ref 65–99)
Potassium: 4.2 mmol/L (ref 3.5–5.2)
SODIUM: 144 mmol/L (ref 134–144)
Total Bilirubin: 0.3 mg/dL (ref 0.0–1.2)
Total Protein: 7.3 g/dL (ref 6.0–8.5)

## 2014-08-11 LAB — LIPID PANEL
CHOL/HDL RATIO: 3.8 ratio (ref 0.0–5.0)
CHOLESTEROL TOTAL: 139 mg/dL (ref 100–199)
HDL: 37 mg/dL — ABNORMAL LOW (ref >39)
LDL Calculated: 76 mg/dL (ref 0–99)
TRIGLYCERIDES: 132 mg/dL (ref 0–149)
VLDL Cholesterol Cal: 26 mg/dL (ref 5–40)

## 2014-08-11 LAB — VITAMIN D 25 HYDROXY (VIT D DEFICIENCY, FRACTURES): Vit D, 25-Hydroxy: 35.8 ng/mL (ref 30.0–100.0)

## 2014-08-11 LAB — MICROALBUMIN, URINE: MICROALBUM., U, RANDOM: 21.3 ug/mL — AB (ref 0.0–17.0)

## 2015-02-07 ENCOUNTER — Encounter: Payer: Self-pay | Admitting: Family

## 2015-02-07 ENCOUNTER — Ambulatory Visit (INDEPENDENT_AMBULATORY_CARE_PROVIDER_SITE_OTHER): Payer: 59 | Admitting: Family

## 2015-02-07 VITALS — BP 114/76 | HR 71 | Temp 97.0°F | Ht 69.0 in | Wt 179.2 lb

## 2015-02-07 DIAGNOSIS — E559 Vitamin D deficiency, unspecified: Secondary | ICD-10-CM

## 2015-02-07 DIAGNOSIS — E785 Hyperlipidemia, unspecified: Secondary | ICD-10-CM | POA: Diagnosis not present

## 2015-02-07 DIAGNOSIS — I10 Essential (primary) hypertension: Secondary | ICD-10-CM | POA: Diagnosis not present

## 2015-02-07 DIAGNOSIS — E1165 Type 2 diabetes mellitus with hyperglycemia: Secondary | ICD-10-CM | POA: Diagnosis not present

## 2015-02-07 LAB — POCT GLYCOSYLATED HEMOGLOBIN (HGB A1C): Hemoglobin A1C: 6.9

## 2015-02-07 NOTE — Patient Instructions (Signed)

## 2015-02-07 NOTE — Progress Notes (Signed)
Subjective:    Patient ID: Barry Derry., male    DOB: May 14, 1951, 64 y.o.   MRN: [id number]  Diabetes He presents for his follow-up diabetic visit. He has type 2 diabetes mellitus. His disease course has been stable. Pertinent negatives for hypoglycemia include no confusion, dizziness or headaches. There are no diabetic associated symptoms. Pertinent negatives for diabetes include no blurred vision, no foot paresthesias, no foot ulcerations and no visual change. Pertinent negatives for hypoglycemia complications include no blackouts. Pertinent negatives for diabetic complications include no CVA, heart disease or peripheral neuropathy. Current diabetic treatment includes oral agent (triple therapy). He is compliant with treatment all of the time. His weight is stable. He is following a generally unhealthy diet. He rarely participates in exercise. (Pt states he does not ever check it ) An ACE inhibitor/angiotensin II receptor blocker is being taken. Eye exam is not current.  Hypertension This is a chronic problem. The current episode started more than 1 year ago. The problem has been resolved since onset. The problem is controlled. Pertinent negatives include no anxiety, blurred vision, headaches, palpitations, peripheral edema or shortness of breath. Risk factors for coronary artery disease include diabetes mellitus, dyslipidemia and male gender. Past treatments include angiotensin blockers. The current treatment provides significant improvement. There is no history of kidney disease, CAD/MI, CVA, heart failure or a thyroid problem. There is no history of sleep apnea.  Hyperlipidemia This is a chronic problem. The current episode started more than 1 year ago. The problem is controlled. Recent lipid tests were reviewed and are normal. Exacerbating diseases include diabetes. He has no history of hypothyroidism. Pertinent negatives include no leg pain, myalgias or shortness of breath. Current  antihyperlipidemic treatment includes statins. The current treatment provides significant improvement of lipids. Risk factors for coronary artery disease include diabetes mellitus, dyslipidemia, hypertension and male sex.      Review of Systems  Constitutional: Negative.   HENT: Negative.   Eyes: Negative for blurred vision.  Respiratory: Negative.  Negative for shortness of breath.   Cardiovascular: Negative.  Negative for palpitations.  Gastrointestinal: Negative.   Endocrine: Negative.   Genitourinary: Negative.   Musculoskeletal: Negative.  Negative for myalgias.  Neurological: Negative.  Negative for dizziness and headaches.  Hematological: Negative.   Psychiatric/Behavioral: Negative.  Negative for confusion.  All other systems reviewed and are negative.      Objective:   Physical Exam  Constitutional: He is oriented to person, place, and time. He appears well-developed and well-nourished. No distress.  HENT:  Head: Normocephalic.  Right Ear: External ear normal.  Left Ear: External ear normal.  Nose: Nose normal.  Mouth/Throat: Oropharynx is clear and moist.  Eyes: Pupils are equal, round, and reactive to light. Right eye exhibits no discharge. Left eye exhibits no discharge.  Neck: Normal range of motion. Neck supple. No thyromegaly present.  Cardiovascular: Normal rate, regular rhythm, normal heart sounds and intact distal pulses.   No murmur heard. Pulmonary/Chest: Effort normal and breath sounds normal. No respiratory distress. He has no wheezes.  Abdominal: Soft. Bowel sounds are normal. He exhibits no distension. There is no tenderness.  Musculoskeletal: Normal range of motion. He exhibits no edema or tenderness.  Neurological: He is alert and oriented to person, place, and time. He has normal reflexes. No cranial nerve deficit.  Skin: Skin is warm and dry. No rash noted. No erythema.  Psychiatric: He has a normal mood and affect. His behavior is normal. Judgment  and thought  content normal.  Vitals reviewed.     BP 114/76 mmHg  Pulse 71  Temp(Src) 97 F (36.1 C)  Ht _0  (1.753 m)  Wt 179 lb 3.2 oz (81.285 kg)  BMI 26.45 kg/m2     Assessment & Plan:  1. Essential hypertension - CMP14+EGFR  2. Type 2 diabetes mellitus with hyperglycemia - POCT glycosylated hemoglobin (Hb A1C) - CMP14+EGFR  3. HLD (hyperlipidemia) - CMP14+EGFR - Lipid panel  4. Vitamin D deficiency - CMP14+EGFR - Vit D  25 hydroxy (rtn osteoporosis monitoring)   Continue all meds Labs pending Health Maintenance reviewed- Pt to check price of Zostavax vaccine Diet and exercise encouraged RTO 4 months  Evelina Dun, FNP

## 2015-02-08 LAB — CMP14+EGFR
A/G RATIO: 2.2 (ref 1.1–2.5)
ALBUMIN: 4.4 g/dL (ref 3.6–4.8)
ALT: 19 IU/L (ref 0–44)
AST: 19 IU/L (ref 0–40)
Alkaline Phosphatase: 56 IU/L (ref 39–117)
BUN/Creatinine Ratio: 8 — ABNORMAL LOW (ref 10–22)
BUN: 7 mg/dL — AB (ref 8–27)
Bilirubin Total: 0.4 mg/dL (ref 0.0–1.2)
CO2: 23 mmol/L (ref 18–29)
Calcium: 9.3 mg/dL (ref 8.6–10.2)
Chloride: 100 mmol/L (ref 97–108)
Creatinine, Ser: 0.84 mg/dL (ref 0.76–1.27)
GFR calc Af Amer: 107 mL/min/{1.73_m2} (ref >59)
GFR calc non Af Amer: 93 mL/min/{1.73_m2} (ref >59)
Globulin, Total: 2 g/dL (ref 1.5–4.5)
Glucose: 106 mg/dL — ABNORMAL HIGH (ref 65–99)
Potassium: 4.1 mmol/L (ref 3.5–5.2)
SODIUM: 141 mmol/L (ref 134–144)
TOTAL PROTEIN: 6.4 g/dL (ref 6.0–8.5)

## 2015-02-08 LAB — LIPID PANEL
Chol/HDL Ratio: 4 ratio units (ref 0.0–5.0)
Cholesterol, Total: 148 mg/dL (ref 100–199)
HDL: 37 mg/dL — ABNORMAL LOW (ref >39)
LDL CALC: 80 mg/dL (ref 0–99)
TRIGLYCERIDES: 155 mg/dL — AB (ref 0–149)
VLDL CHOLESTEROL CAL: 31 mg/dL (ref 5–40)

## 2015-02-08 LAB — VITAMIN D 25 HYDROXY (VIT D DEFICIENCY, FRACTURES): Vit D, 25-Hydroxy: 27.2 ng/mL — ABNORMAL LOW (ref 30.0–100.0)

## 2015-02-09 ENCOUNTER — Other Ambulatory Visit: Payer: Self-pay | Admitting: Family

## 2015-02-09 MED ORDER — VITAMIN D (ERGOCALCIFEROL) 1.25 MG (50000 UNIT) PO CAPS: 50000.0000 [IU] | ORAL_CAPSULE | ORAL | Status: DC

## 2015-02-09 NOTE — Progress Notes (Signed)
lmtcb

## 2015-02-12 ENCOUNTER — Telehealth: Payer: Self-pay | Admitting: Family

## 2015-02-12 NOTE — Telephone Encounter (Signed)
Patient aware of results.

## 2015-05-17 ENCOUNTER — Encounter: Payer: Self-pay | Admitting: Family

## 2015-05-17 ENCOUNTER — Ambulatory Visit (INDEPENDENT_AMBULATORY_CARE_PROVIDER_SITE_OTHER): Payer: 59 | Admitting: Family

## 2015-05-17 VITALS — BP 138/81 | HR 75 | Temp 97.1°F | Ht 69.0 in | Wt 178.0 lb

## 2015-05-17 DIAGNOSIS — E559 Vitamin D deficiency, unspecified: Secondary | ICD-10-CM

## 2015-05-17 DIAGNOSIS — E785 Hyperlipidemia, unspecified: Secondary | ICD-10-CM

## 2015-05-17 DIAGNOSIS — Z23 Encounter for immunization: Secondary | ICD-10-CM

## 2015-05-17 DIAGNOSIS — E1165 Type 2 diabetes mellitus with hyperglycemia: Secondary | ICD-10-CM

## 2015-05-17 DIAGNOSIS — I10 Essential (primary) hypertension: Secondary | ICD-10-CM

## 2015-05-17 LAB — POCT GLYCOSYLATED HEMOGLOBIN (HGB A1C): Hemoglobin A1C: 6.5

## 2015-05-17 MED ORDER — DULAGLUTIDE 0.75 MG/0.5ML ~~LOC~~ SOAJ: 0.7500 ug | SUBCUTANEOUS | Status: DC

## 2015-05-17 NOTE — Patient Instructions (Signed)

## 2015-05-17 NOTE — Addendum Note (Signed)
Addended by: Jannifer RodneyHAWKS, Indya Oliveria A on: 05/17/2015 09:11 AM   Modules accepted: Orders

## 2015-05-17 NOTE — Addendum Note (Signed)
Addended by: Prescott GumLAND, Shuna Tabor M on: 05/17/2015 09:20 AM   Modules accepted: Kipp BroodSmartSet

## 2015-05-17 NOTE — Progress Notes (Addendum)
Subjective:    Patient ID: Barry Smith., male    DOB: [birth year], 64 y.o.   MRN: [id number]  Pt presents to the office today for chronic follow up.  Diabetes He presents for his follow-up diabetic visit. He has type 2 diabetes mellitus. His disease course has been stable. Pertinent negatives for hypoglycemia include no confusion, dizziness or headaches. Pertinent negatives for diabetes include no blurred vision, no foot paresthesias, no foot ulcerations and no visual change. Pertinent negatives for hypoglycemia complications include no blackouts. Pertinent negatives for diabetic complications include no CVA, heart disease or peripheral neuropathy. Current diabetic treatment includes oral agent (triple therapy). He is compliant with treatment all of the time. His weight is stable. He is following a generally unhealthy diet. He rarely participates in exercise. (Pt states he does not ever check it ) An ACE inhibitor/angiotensin II receptor blocker is being taken. Eye exam is not current.  Hypertension This is a chronic problem. The current episode started more than 1 year ago. The problem has been resolved since onset. The problem is controlled. Pertinent negatives include no anxiety, blurred vision, headaches, palpitations, peripheral edema or shortness of breath. Risk factors for coronary artery disease include diabetes mellitus, dyslipidemia and male gender. Past treatments include angiotensin blockers. The current treatment provides significant improvement. There is no history of kidney disease, CAD/MI, CVA, heart failure or a thyroid problem. There is no history of sleep apnea.  Hyperlipidemia This is a chronic problem. The current episode started more than 1 year ago. The problem is controlled. Recent lipid tests were reviewed and are normal. Exacerbating diseases include diabetes. He has no history of hypothyroidism. Pertinent negatives include no leg pain, myalgias or shortness of breath.  Current antihyperlipidemic treatment includes statins. The current treatment provides significant improvement of lipids. Risk factors for coronary artery disease include diabetes mellitus, dyslipidemia, hypertension and male sex.      Review of Systems  Constitutional: Negative.   HENT: Negative.   Eyes: Negative for blurred vision.  Respiratory: Negative.  Negative for shortness of breath.   Cardiovascular: Negative.  Negative for palpitations.  Gastrointestinal: Negative.   Endocrine: Negative.   Genitourinary: Negative.   Musculoskeletal: Negative.  Negative for myalgias.  Neurological: Negative.  Negative for dizziness and headaches.  Hematological: Negative.   Psychiatric/Behavioral: Negative.  Negative for confusion.  All other systems reviewed and are negative.      Objective:   Physical Exam  Constitutional: He is oriented to person, place, and time. He appears well-developed and well-nourished. No distress.  HENT:  Head: Normocephalic.  Right Ear: External ear normal.  Left Ear: External ear normal.  Nose: Nose normal.  Mouth/Throat: Oropharynx is clear and moist.  Eyes: Pupils are equal, round, and reactive to light. Right eye exhibits no discharge. Left eye exhibits no discharge.  Neck: Normal range of motion. Neck supple. No thyromegaly present.  Cardiovascular: Normal rate, regular rhythm, normal heart sounds and intact distal pulses.   No murmur heard. Pulmonary/Chest: Effort normal and breath sounds normal. No respiratory distress. He has no wheezes.  Abdominal: Soft. Bowel sounds are normal. He exhibits no distension. There is no tenderness.  Musculoskeletal: Normal range of motion. He exhibits no edema or tenderness.  Neurological: He is alert and oriented to person, place, and time. He has normal reflexes. No cranial nerve deficit.  Skin: Skin is warm and dry. No rash noted. No erythema.  Psychiatric: He has a normal mood and affect. His behavior is  normal.  Judgment and thought content normal.  Vitals reviewed.     BP 141/91 mmHg  Pulse 82  Temp(Src) 97.1 F (36.2 C) (Oral)  Ht 5' 9"  (1.753 m)  Wt 178 lb (80.74 kg)  BMI 26.27 kg/m2     Assessment & Plan:  1. Essential hypertension - CMP14+EGFR  2. Type 2 diabetes mellitus with hyperglycemia, without long-term current use of insulin (HCC) - POCT glycosylated hemoglobin (Hb A1C) - CMP14+EGFR -Pt's Victoza d/c today because pt does not have the money to buy -Pt given samples of Trulicity and instructions on how to use  3. Vitamin D deficiency - CMP14+EGFR  4. HLD (hyperlipidemia) - CMP14+EGFR   Continue all meds Labs pending- Will hold off on Lipid panel because patient lost insurance and is working to get it back. Health Maintenance reviewed-Flu vaccine given today Diet and exercise encouraged RTO 4 months  Evelina Dun, FNP

## 2015-05-18 LAB — CMP14+EGFR
A/G RATIO: 2.3 (ref 1.1–2.5)
ALBUMIN: 4.8 g/dL (ref 3.6–4.8)
ALT: 17 IU/L (ref 0–44)
AST: 18 IU/L (ref 0–40)
Alkaline Phosphatase: 54 IU/L (ref 39–117)
BILIRUBIN TOTAL: 0.2 mg/dL (ref 0.0–1.2)
BUN/Creatinine Ratio: 16 (ref 10–22)
BUN: 12 mg/dL (ref 8–27)
CO2: 22 mmol/L (ref 18–29)
Calcium: 9.7 mg/dL (ref 8.6–10.2)
Chloride: 102 mmol/L (ref 97–106)
Creatinine, Ser: 0.76 mg/dL (ref 0.76–1.27)
GFR calc non Af Amer: 96 mL/min/{1.73_m2} (ref >59)
GFR, EST AFRICAN AMERICAN: 111 mL/min/{1.73_m2} (ref >59)
GLOBULIN, TOTAL: 2.1 g/dL (ref 1.5–4.5)
Glucose: 127 mg/dL — ABNORMAL HIGH (ref 65–99)
POTASSIUM: 4.2 mmol/L (ref 3.5–5.2)
SODIUM: 144 mmol/L (ref 136–144)
TOTAL PROTEIN: 6.9 g/dL (ref 6.0–8.5)

## 2015-08-09 ENCOUNTER — Other Ambulatory Visit: Payer: Self-pay | Admitting: Family

## 2015-09-21 ENCOUNTER — Other Ambulatory Visit: Payer: Self-pay | Admitting: Family

## 2015-10-16 ENCOUNTER — Other Ambulatory Visit: Payer: Self-pay | Admitting: Nurse Practitioner

## 2015-10-16 NOTE — Telephone Encounter (Signed)
Last seen 05/17/15  Barry Smith 

## 2015-11-03 ENCOUNTER — Other Ambulatory Visit: Payer: Self-pay | Admitting: Family

## 2015-11-05 NOTE — Telephone Encounter (Signed)
Last seen 05/17/15  Barry Smith 

## 2015-11-13 ENCOUNTER — Encounter: Payer: Self-pay | Admitting: Family

## 2015-11-13 ENCOUNTER — Ambulatory Visit (INDEPENDENT_AMBULATORY_CARE_PROVIDER_SITE_OTHER): Payer: Medicare HMO | Admitting: Family

## 2015-11-13 VITALS — BP 132/80 | HR 81 | Temp 96.8°F | Ht 69.0 in | Wt 176.4 lb

## 2015-11-13 DIAGNOSIS — I10 Essential (primary) hypertension: Secondary | ICD-10-CM | POA: Diagnosis not present

## 2015-11-13 DIAGNOSIS — Z125 Encounter for screening for malignant neoplasm of prostate: Secondary | ICD-10-CM | POA: Diagnosis not present

## 2015-11-13 DIAGNOSIS — E1165 Type 2 diabetes mellitus with hyperglycemia: Secondary | ICD-10-CM | POA: Diagnosis not present

## 2015-11-13 DIAGNOSIS — E785 Hyperlipidemia, unspecified: Secondary | ICD-10-CM

## 2015-11-13 DIAGNOSIS — E559 Vitamin D deficiency, unspecified: Secondary | ICD-10-CM

## 2015-11-13 DIAGNOSIS — Z1159 Encounter for screening for other viral diseases: Secondary | ICD-10-CM

## 2015-11-13 LAB — BAYER DCA HB A1C WAIVED: HB A1C: 6.8 % (ref <7.0)

## 2015-11-13 NOTE — Progress Notes (Signed)
Subjective:    Patient ID: Barry Smith., male    DOB: [birth year], 65 y.o.   MRN: [id number]  Pt presents to the office today for chronic follow up.  Diabetes He presents for his follow-up diabetic visit. He has type 2 diabetes mellitus. His disease course has been stable. Pertinent negatives for hypoglycemia include no confusion, dizziness or headaches. Pertinent negatives for diabetes include no blurred vision, no foot paresthesias, no foot ulcerations and no visual change. Pertinent negatives for hypoglycemia complications include no blackouts. Pertinent negatives for diabetic complications include no CVA, heart disease or peripheral neuropathy. Current diabetic treatment includes oral agent (triple therapy). He is compliant with treatment all of the time. His weight is stable. He is following a generally unhealthy diet. He rarely participates in exercise. (Pt states he does not ever check it ) An ACE inhibitor/angiotensin II receptor blocker is being taken. Eye exam is not current.  Hypertension This is a chronic problem. The current episode started more than 1 year ago. The problem has been resolved since onset. The problem is controlled. Pertinent negatives include no anxiety, blurred vision, headaches, palpitations, peripheral edema or shortness of breath. Risk factors for coronary artery disease include diabetes mellitus, dyslipidemia and male gender. Past treatments include angiotensin blockers. The current treatment provides significant improvement. There is no history of kidney disease, CAD/MI, CVA, heart failure or a thyroid problem. There is no history of sleep apnea.  Hyperlipidemia This is a chronic problem. The current episode started more than 1 year ago. The problem is controlled. Recent lipid tests were reviewed and are normal. Exacerbating diseases include diabetes. He has no history of hypothyroidism. Pertinent negatives include no leg pain, myalgias or shortness of breath.  Current antihyperlipidemic treatment includes statins. The current treatment provides significant improvement of lipids. Risk factors for coronary artery disease include diabetes mellitus, dyslipidemia, hypertension and male sex.      Review of Systems  Constitutional: Negative.   HENT: Negative.   Eyes: Negative for blurred vision.  Respiratory: Negative.  Negative for shortness of breath.   Cardiovascular: Negative.  Negative for palpitations.  Gastrointestinal: Negative.   Endocrine: Negative.   Genitourinary: Negative.   Musculoskeletal: Negative.  Negative for myalgias.  Neurological: Negative.  Negative for dizziness and headaches.  Hematological: Negative.   Psychiatric/Behavioral: Negative.  Negative for confusion.  All other systems reviewed and are negative.      Objective:   Physical Exam  Constitutional: He is oriented to person, place, and time. He appears well-developed and well-nourished. No distress.  HENT:  Head: Normocephalic.  Right Ear: External ear normal.  Left Ear: External ear normal.  Nose: Nose normal.  Mouth/Throat: Oropharynx is clear and moist.  Eyes: Pupils are equal, round, and reactive to light. Right eye exhibits no discharge. Left eye exhibits no discharge.  Neck: Normal range of motion. Neck supple. No thyromegaly present.  Cardiovascular: Normal rate, regular rhythm, normal heart sounds and intact distal pulses.   No murmur heard. Pulmonary/Chest: Effort normal and breath sounds normal. No respiratory distress. He has no wheezes.  Abdominal: Soft. Bowel sounds are normal. He exhibits no distension. There is no tenderness.  Musculoskeletal: Normal range of motion. He exhibits no edema or tenderness.  Neurological: He is alert and oriented to person, place, and time. He has normal reflexes. No cranial nerve deficit.  Skin: Skin is warm and dry. No rash noted. No erythema.  Psychiatric: He has a normal mood and affect. His behavior is  normal.  Judgment and thought content normal.  Vitals reviewed.     BP 132/80 mmHg  Pulse 81  Temp(Src) 96.8 F (36 C) (Oral)  Ht 5' 9"  (1.753 m)  Wt 176 lb 6.4 oz (80.015 kg)  BMI 26.04 kg/m2     Assessment & Plan:  1. Essential hypertension - CMP14+EGFR  2. Type 2 diabetes mellitus with hyperglycemia, without long-term current use of insulin (HCC) - Bayer DCA Hb A1c Waived - Microalbumin / creatinine urine ratio - CMP14+EGFR - Ambulatory referral to Ophthalmology  3. Vitamin D deficiency - CMP14+EGFR - VITAMIN D 25 Hydroxy (Vit-D Deficiency, Fractures)  4. HLD (hyperlipidemia) - Lipid panel - CMP14+EGFR  5. Need for hepatitis C screening test - CMP14+EGFR - Hepatitis C Antibody  6. Prostate cancer screening - PSA, total and free   Continue all meds Labs pending Health Maintenance reviewed Diet and exercise encouraged RTO 4 months  Evelina Dun, FNP

## 2015-11-13 NOTE — Patient Instructions (Signed)

## 2015-11-14 LAB — LIPID PANEL
CHOL/HDL RATIO: 3.3 ratio (ref 0.0–5.0)
Cholesterol, Total: 143 mg/dL (ref 100–199)
HDL: 43 mg/dL (ref >39)
LDL Calculated: 82 mg/dL (ref 0–99)
Triglycerides: 92 mg/dL (ref 0–149)
VLDL Cholesterol Cal: 18 mg/dL (ref 5–40)

## 2015-11-14 LAB — CMP14+EGFR
ALBUMIN: 4.7 g/dL (ref 3.6–4.8)
ALT: 14 IU/L (ref 0–44)
AST: 19 IU/L (ref 0–40)
Albumin/Globulin Ratio: 1.9 (ref 1.2–2.2)
Alkaline Phosphatase: 56 IU/L (ref 39–117)
BUN / CREAT RATIO: 15 (ref 10–24)
BUN: 14 mg/dL (ref 8–27)
Bilirubin Total: 0.5 mg/dL (ref 0.0–1.2)
CALCIUM: 9.8 mg/dL (ref 8.6–10.2)
CHLORIDE: 102 mmol/L (ref 96–106)
CO2: 22 mmol/L (ref 18–29)
CREATININE: 0.92 mg/dL (ref 0.76–1.27)
GFR calc non Af Amer: 88 mL/min/{1.73_m2} (ref >59)
GFR, EST AFRICAN AMERICAN: 101 mL/min/{1.73_m2} (ref >59)
GLUCOSE: 111 mg/dL — AB (ref 65–99)
Globulin, Total: 2.5 g/dL (ref 1.5–4.5)
Potassium: 4.5 mmol/L (ref 3.5–5.2)
Sodium: 142 mmol/L (ref 134–144)
TOTAL PROTEIN: 7.2 g/dL (ref 6.0–8.5)

## 2015-11-14 LAB — PSA, TOTAL AND FREE
PROSTATE SPECIFIC AG, SERUM: 1.4 ng/mL (ref 0.0–4.0)
PSA, Free Pct: 20 %
PSA, Free: 0.28 ng/mL

## 2015-11-14 LAB — MICROALBUMIN / CREATININE URINE RATIO
CREATININE, UR: 169.3 mg/dL
MICROALB/CREAT RATIO: 17 mg/g{creat} (ref 0.0–30.0)
MICROALBUM., U, RANDOM: 28.8 ug/mL

## 2015-11-14 LAB — HEPATITIS C ANTIBODY: Hep C Virus Ab: <0.1 s/co ratio (ref 0.0–0.9)

## 2015-11-14 LAB — VITAMIN D 25 HYDROXY (VIT D DEFICIENCY, FRACTURES): Vit D, 25-Hydroxy: 50.6 ng/mL (ref 30.0–100.0)

## 2015-11-15 ENCOUNTER — Telehealth: Payer: Self-pay | Admitting: Family

## 2015-11-15 MED ORDER — AZITHROMYCIN 250 MG PO TABS: ORAL_TABLET | ORAL | Status: DC

## 2015-11-15 NOTE — Telephone Encounter (Signed)
Patients wife aware

## 2015-11-15 NOTE — Telephone Encounter (Signed)
Zpak Prescription sent to pharmacy  - Take meds as prescribed - Use a cool mist humidifier  -Use saline nose sprays frequently -Saline irrigations of the nose can be very helpful if done frequently.  * 4X daily for 1 week*  * Use of a nettie pot can be helpful with this. Follow directions with this* -Force fluids -For any cough or congestion  Use plain Mucinex- regular strength or max strength is fine   * Children- consult with Pharmacist for dosing -For fever or aces or pains- take tylenol or ibuprofen appropriate for age and weight.  * for fevers greater than 101 orally you may alternate ibuprofen and tylenol every  3 hours. -Throat lozenges if help -New toothbrush in 3 days   Barry Rodneyhristy Eshani Maestre, FNP

## 2015-11-20 ENCOUNTER — Telehealth: Payer: Self-pay | Admitting: Family

## 2015-11-20 DIAGNOSIS — N400 Enlarged prostate without lower urinary tract symptoms: Secondary | ICD-10-CM | POA: Insufficient documentation

## 2015-11-20 MED ORDER — TAMSULOSIN HCL 0.4 MG PO CAPS: 0.4000 mg | ORAL_CAPSULE | Freq: Every day | ORAL | Status: DC

## 2015-11-20 NOTE — Telephone Encounter (Signed)
Prescription sent to pharmacy.

## 2015-11-20 NOTE — Telephone Encounter (Signed)
Is patient already taking this medication? And what does he need it for?

## 2015-11-20 NOTE — Telephone Encounter (Signed)
He is not on this - his brother takes it  He is having problem:: "SLOW FLOW " with urination.

## 2015-11-21 NOTE — Telephone Encounter (Signed)
Left detailed message that requested rx had been sent to pharmacy and to call back with any further questions or concerns.

## 2015-12-03 LAB — HM DIABETES EYE EXAM

## 2015-12-07 ENCOUNTER — Other Ambulatory Visit: Payer: Self-pay | Admitting: Family

## 2016-01-21 ENCOUNTER — Other Ambulatory Visit: Payer: Self-pay | Admitting: Family

## 2016-01-30 ENCOUNTER — Other Ambulatory Visit: Payer: Self-pay | Admitting: Family

## 2016-01-30 MED ORDER — PREGABALIN 100 MG PO CAPS: 100.0000 mg | ORAL_CAPSULE | Freq: Three times a day (TID) | ORAL | Status: DC

## 2016-01-31 ENCOUNTER — Telehealth: Payer: Self-pay | Admitting: Family

## 2016-01-31 MED ORDER — BLOOD GLUCOSE METER KIT: PACK | Status: DC

## 2016-01-31 NOTE — Telephone Encounter (Signed)
[facility]  Patient notified via voicemail.

## 2016-01-31 NOTE — Telephone Encounter (Signed)
Please call in/. Will not let me send it electronically

## 2016-02-01 ENCOUNTER — Other Ambulatory Visit: Payer: Self-pay | Admitting: *Deleted

## 2016-02-01 MED ORDER — BLOOD GLUCOSE METER KIT: PACK | Status: DC

## 2016-02-04 ENCOUNTER — Other Ambulatory Visit: Payer: Self-pay | Admitting: *Deleted

## 2016-02-04 MED ORDER — BLOOD GLUCOSE METER KIT: PACK | Status: DC

## 2016-02-22 ENCOUNTER — Other Ambulatory Visit: Payer: Self-pay | Admitting: Family

## 2016-03-08 ENCOUNTER — Other Ambulatory Visit: Payer: Self-pay | Admitting: Family

## 2016-03-14 ENCOUNTER — Ambulatory Visit (INDEPENDENT_AMBULATORY_CARE_PROVIDER_SITE_OTHER): Payer: Medicare HMO | Admitting: Family

## 2016-03-14 ENCOUNTER — Ambulatory Visit: Payer: Medicare HMO | Admitting: Family

## 2016-03-14 ENCOUNTER — Encounter: Payer: Self-pay | Admitting: Family

## 2016-03-14 VITALS — BP 134/74 | HR 67 | Temp 97.2°F | Ht 69.0 in | Wt 185.6 lb

## 2016-03-14 DIAGNOSIS — E785 Hyperlipidemia, unspecified: Secondary | ICD-10-CM | POA: Diagnosis not present

## 2016-03-14 DIAGNOSIS — E559 Vitamin D deficiency, unspecified: Secondary | ICD-10-CM | POA: Diagnosis not present

## 2016-03-14 DIAGNOSIS — E1165 Type 2 diabetes mellitus with hyperglycemia: Secondary | ICD-10-CM | POA: Diagnosis not present

## 2016-03-14 DIAGNOSIS — E663 Overweight: Secondary | ICD-10-CM | POA: Insufficient documentation

## 2016-03-14 DIAGNOSIS — I10 Essential (primary) hypertension: Secondary | ICD-10-CM

## 2016-03-14 LAB — BAYER DCA HB A1C WAIVED: HB A1C (BAYER DCA - WAIVED): 7.3 % — ABNORMAL HIGH (ref <7.0)

## 2016-03-14 NOTE — Patient Instructions (Signed)

## 2016-03-14 NOTE — Progress Notes (Signed)
Subjective:    Patient ID: Barry Smith., male    DOB: May 23, 1951, 65 y.o.   MRN: [id number]  Pt presents to the office today for chronic follow up.  Diabetes  He presents for his follow-up diabetic visit. He has type 2 diabetes mellitus. His disease course has been stable. Pertinent negatives for hypoglycemia include no confusion, dizziness or headaches. Pertinent negatives for diabetes include no blurred vision, no foot paresthesias, no foot ulcerations and no visual change. Pertinent negatives for hypoglycemia complications include no blackouts. Pertinent negatives for diabetic complications include no CVA, heart disease or peripheral neuropathy. Current diabetic treatment includes oral agent (triple therapy). He is compliant with treatment all of the time. His weight is stable. He is following a generally unhealthy diet. He rarely participates in exercise. His breakfast blood glucose range is generally 140-180 mg/dl. (Pt states he does not ever check it ) An ACE inhibitor/angiotensin II receptor blocker is being taken. Eye exam is current.  Hypertension  This is a chronic problem. The current episode started more than 1 year ago. The problem has been resolved since onset. The problem is controlled. Pertinent negatives include no anxiety, blurred vision, headaches, palpitations, peripheral edema or shortness of breath. Risk factors for coronary artery disease include diabetes mellitus, dyslipidemia, male gender and obesity. Past treatments include angiotensin blockers. The current treatment provides significant improvement. There is no history of kidney disease, CAD/MI, CVA, heart failure or a thyroid problem. There is no history of sleep apnea.  Hyperlipidemia  This is a chronic problem. The current episode started more than 1 year ago. The problem is controlled. Recent lipid tests were reviewed and are normal. Exacerbating diseases include diabetes and obesity. He has no history of  hypothyroidism. Pertinent negatives include no leg pain, myalgias or shortness of breath. Current antihyperlipidemic treatment includes statins. The current treatment provides significant improvement of lipids. Risk factors for coronary artery disease include diabetes mellitus, dyslipidemia, hypertension and male sex.  Benign Prostatic Hypertrophy  This is a chronic problem. The current episode started more than 1 year ago. The problem has been resolved since onset. Irritative symptoms do not include frequency or nocturia. Pertinent negatives include no hematuria, hesitancy, nausea or vomiting. Past treatments include tamsulosin. The treatment provided moderate relief.      Review of Systems  Constitutional: Negative.   HENT: Negative.   Eyes: Negative for blurred vision.  Respiratory: Negative.  Negative for shortness of breath.   Cardiovascular: Negative.  Negative for palpitations.  Gastrointestinal: Negative.  Negative for nausea and vomiting.  Endocrine: Negative.   Genitourinary: Negative.  Negative for frequency, hematuria, hesitancy and nocturia.  Musculoskeletal: Negative.  Negative for myalgias.  Neurological: Negative.  Negative for dizziness and headaches.  Hematological: Negative.   Psychiatric/Behavioral: Negative.  Negative for confusion.  All other systems reviewed and are negative.      Objective:   Physical Exam  Constitutional: He is oriented to person, place, and time. He appears well-developed and well-nourished. No distress.  HENT:  Head: Normocephalic.  Right Ear: External ear normal.  Left Ear: External ear normal.  Nose: Nose normal.  Mouth/Throat: Oropharynx is clear and moist.  Eyes: Pupils are equal, round, and reactive to light. Right eye exhibits no discharge. Left eye exhibits no discharge.  Neck: Normal range of motion. Neck supple. No thyromegaly present.  Cardiovascular: Normal rate, regular rhythm, normal heart sounds and intact distal pulses.     No murmur heard. Pulmonary/Chest: Effort normal and breath  sounds normal. No respiratory distress. He has no wheezes.  Abdominal: Soft. Bowel sounds are normal. He exhibits no distension. There is no tenderness.  Musculoskeletal: Normal range of motion. He exhibits no edema or tenderness.  Neurological: He is alert and oriented to person, place, and time. He has normal reflexes. No cranial nerve deficit.  Skin: Skin is warm and dry. No rash noted. No erythema.  Psychiatric: He has a normal mood and affect. His behavior is normal. Judgment and thought content normal.  Vitals reviewed.     BP 134/74   Pulse 67   Temp 97.2 F (36.2 C) (Oral)   Ht _0  (1.753 m)   Wt 185 lb 9.6 oz (84.2 kg)   BMI 27.41 kg/m      Assessment & Plan:  1. Essential hypertension - CMP14+EGFR  2. Type 2 diabetes mellitus with hyperglycemia, without long-term current use of insulin (HCC) - Bayer DCA Hb A1c Waived - CMP14+EGFR  3. HLD (hyperlipidemia) - CMP14+EGFR - Lipid panel  4. Vitamin D deficiency - CMP14+EGFR  5. Morbid obesity, unspecified obesity type ([place]) - CMP14+EGFR   Continue all meds Labs pending Health Maintenance reviewed Diet and exercise encouraged RTO 3 months  Evelina Dun, FNP

## 2016-03-15 LAB — CMP14+EGFR
A/G RATIO: 2.1 (ref 1.2–2.2)
ALK PHOS: 48 IU/L (ref 39–117)
ALT: 21 IU/L (ref 0–44)
AST: 21 IU/L (ref 0–40)
Albumin: 4.7 g/dL (ref 3.6–4.8)
BILIRUBIN TOTAL: 0.2 mg/dL (ref 0.0–1.2)
BUN/Creatinine Ratio: 14 (ref 10–24)
BUN: 11 mg/dL (ref 8–27)
CHLORIDE: 104 mmol/L (ref 96–106)
CO2: 23 mmol/L (ref 18–29)
Calcium: 9.5 mg/dL (ref 8.6–10.2)
Creatinine, Ser: 0.78 mg/dL (ref 0.76–1.27)
GFR calc Af Amer: 110 mL/min/{1.73_m2} (ref >59)
GFR calc non Af Amer: 95 mL/min/{1.73_m2} (ref >59)
Globulin, Total: 2.2 g/dL (ref 1.5–4.5)
Glucose: 92 mg/dL (ref 65–99)
POTASSIUM: 4 mmol/L (ref 3.5–5.2)
Sodium: 145 mmol/L — ABNORMAL HIGH (ref 134–144)
Total Protein: 6.9 g/dL (ref 6.0–8.5)

## 2016-03-15 LAB — LIPID PANEL
CHOLESTEROL TOTAL: 121 mg/dL (ref 100–199)
Chol/HDL Ratio: 3.1 ratio units (ref 0.0–5.0)
HDL: 39 mg/dL — AB (ref >39)
LDL Calculated: 66 mg/dL (ref 0–99)
TRIGLYCERIDES: 82 mg/dL (ref 0–149)
VLDL Cholesterol Cal: 16 mg/dL (ref 5–40)

## 2016-03-17 ENCOUNTER — Telehealth: Payer: Self-pay | Admitting: Family

## 2016-03-17 NOTE — Telephone Encounter (Signed)
Pt aware to continue with current regimen of fish oil in the evening but to follow a low fat diet and exercise. Pt voiced understanding.

## 2016-04-22 ENCOUNTER — Other Ambulatory Visit: Payer: Self-pay | Admitting: Family

## 2016-04-22 DIAGNOSIS — I10 Essential (primary) hypertension: Secondary | ICD-10-CM

## 2016-04-30 ENCOUNTER — Telehealth: Payer: Self-pay | Admitting: Family

## 2016-04-30 MED ORDER — TRAZODONE HCL 50 MG PO TABS: 25.0000 mg | ORAL_TABLET | Freq: Every evening | ORAL | 3 refills | Status: DC | PRN

## 2016-04-30 NOTE — Telephone Encounter (Signed)
Wife aware

## 2016-04-30 NOTE — Telephone Encounter (Signed)
Wife aware you are out of the office and It would not get addressed until tomorrow. Wife states that patient has tried otc Melatin gummies and they are not working. Advised wife I could make him an appointment to be seen today and wife states that he does not want to be seen and will wait for Neysa BonitoChristy to be in the office tomorrow. Please advise.

## 2016-04-30 NOTE — Telephone Encounter (Signed)
Trazodone Prescription sent to pharmacy. Pt needs to only take as needed and not every night. Avoid caffeine and sleep ritual

## 2016-05-20 ENCOUNTER — Ambulatory Visit (INDEPENDENT_AMBULATORY_CARE_PROVIDER_SITE_OTHER): Payer: Medicare HMO

## 2016-05-20 DIAGNOSIS — Z23 Encounter for immunization: Secondary | ICD-10-CM

## 2016-05-26 ENCOUNTER — Other Ambulatory Visit: Payer: Self-pay | Admitting: Family

## 2016-06-02 ENCOUNTER — Other Ambulatory Visit: Payer: Self-pay | Admitting: Family

## 2016-06-24 ENCOUNTER — Other Ambulatory Visit: Payer: Self-pay | Admitting: Family

## 2016-06-29 ENCOUNTER — Other Ambulatory Visit: Payer: Self-pay | Admitting: Family

## 2016-07-25 ENCOUNTER — Other Ambulatory Visit: Payer: Self-pay | Admitting: Family

## 2016-07-25 DIAGNOSIS — I10 Essential (primary) hypertension: Secondary | ICD-10-CM

## 2016-07-29 ENCOUNTER — Other Ambulatory Visit: Payer: Self-pay | Admitting: Family

## 2016-07-29 NOTE — Telephone Encounter (Signed)
NTBS for lyrica refill

## 2016-07-30 NOTE — Telephone Encounter (Signed)
Appointment scheduled for 08-07-2016.

## 2016-08-07 ENCOUNTER — Encounter: Payer: Self-pay | Admitting: Family

## 2016-08-07 ENCOUNTER — Ambulatory Visit (INDEPENDENT_AMBULATORY_CARE_PROVIDER_SITE_OTHER): Payer: Medicare HMO | Admitting: Family

## 2016-08-07 VITALS — BP 124/73 | HR 77 | Temp 97.1°F | Ht 69.0 in | Wt 183.0 lb

## 2016-08-07 DIAGNOSIS — E559 Vitamin D deficiency, unspecified: Secondary | ICD-10-CM | POA: Diagnosis not present

## 2016-08-07 DIAGNOSIS — E785 Hyperlipidemia, unspecified: Secondary | ICD-10-CM

## 2016-08-07 DIAGNOSIS — G4700 Insomnia, unspecified: Secondary | ICD-10-CM

## 2016-08-07 DIAGNOSIS — E1140 Type 2 diabetes mellitus with diabetic neuropathy, unspecified: Secondary | ICD-10-CM | POA: Insufficient documentation

## 2016-08-07 DIAGNOSIS — I10 Essential (primary) hypertension: Secondary | ICD-10-CM

## 2016-08-07 DIAGNOSIS — N401 Enlarged prostate with lower urinary tract symptoms: Secondary | ICD-10-CM | POA: Diagnosis not present

## 2016-08-07 DIAGNOSIS — E1165 Type 2 diabetes mellitus with hyperglycemia: Secondary | ICD-10-CM

## 2016-08-07 DIAGNOSIS — E1142 Type 2 diabetes mellitus with diabetic polyneuropathy: Secondary | ICD-10-CM | POA: Diagnosis not present

## 2016-08-07 MED ORDER — PREGABALIN 100 MG PO CAPS: 100.0000 mg | ORAL_CAPSULE | Freq: Three times a day (TID) | ORAL | 3 refills | Status: DC

## 2016-08-07 MED ORDER — TRAZODONE HCL 50 MG PO TABS: 25.0000 mg | ORAL_TABLET | Freq: Every evening | ORAL | 3 refills | Status: DC | PRN

## 2016-08-07 MED ORDER — METFORMIN HCL 1000 MG PO TABS: 1000.0000 mg | ORAL_TABLET | Freq: Two times a day (BID) | ORAL | 3 refills | Status: DC

## 2016-08-07 MED ORDER — GLIMEPIRIDE 2 MG PO TABS: 2.0000 mg | ORAL_TABLET | Freq: Every morning | ORAL | 2 refills | Status: DC

## 2016-08-07 MED ORDER — SIMVASTATIN 20 MG PO TABS: 20.0000 mg | ORAL_TABLET | Freq: Every day | ORAL | 1 refills | Status: DC

## 2016-08-07 MED ORDER — LOSARTAN POTASSIUM 100 MG PO TABS: ORAL_TABLET | ORAL | 2 refills | Status: DC

## 2016-08-07 MED ORDER — TAMSULOSIN HCL 0.4 MG PO CAPS: ORAL_CAPSULE | ORAL | 1 refills | Status: DC

## 2016-08-07 NOTE — Patient Instructions (Signed)

## 2016-08-07 NOTE — Progress Notes (Signed)
Subjective:    Patient ID: Barry Derry., male    DOB: 01/24/1951, 66 y.o.   MRN: [id number]  Pt presents to the office today for chronic follow up.  Diabetes  He presents for his follow-up diabetic visit. He has type 2 diabetes mellitus. His disease course has been stable. Pertinent negatives for hypoglycemia include no confusion, dizziness or headaches. Associated symptoms include foot paresthesias (bilateral feet in the morning). Pertinent negatives for diabetes include no blurred vision, no foot ulcerations and no visual change. Pertinent negatives for hypoglycemia complications include no blackouts. Pertinent negatives for diabetic complications include no CVA, heart disease or peripheral neuropathy. Current diabetic treatment includes oral agent (triple therapy). He is compliant with treatment all of the time. His weight is stable. He is following a generally unhealthy diet. He rarely participates in exercise. His breakfast blood glucose range is generally 130-140 mg/dl. (Pt states he does not ever check it ) An ACE inhibitor/angiotensin II receptor blocker is being taken. Eye exam is current.  Hypertension  This is a chronic problem. The current episode started more than 1 year ago. The problem has been resolved since onset. The problem is controlled. Pertinent negatives include no anxiety, blurred vision, headaches, palpitations, peripheral edema or shortness of breath. Risk factors for coronary artery disease include diabetes mellitus, dyslipidemia, male gender and obesity. Past treatments include angiotensin blockers. The current treatment provides significant improvement. There is no history of kidney disease, CAD/MI, CVA, heart failure or a thyroid problem. There is no history of sleep apnea.  Hyperlipidemia  This is a chronic problem. The current episode started more than 1 year ago. The problem is controlled. Recent lipid tests were reviewed and are normal. Exacerbating diseases  include diabetes and obesity. He has no history of hypothyroidism. Pertinent negatives include no leg pain, myalgias or shortness of breath. Current antihyperlipidemic treatment includes statins. The current treatment provides significant improvement of lipids. Risk factors for coronary artery disease include diabetes mellitus, dyslipidemia, hypertension and male sex.  Benign Prostatic Hypertrophy  This is a chronic problem. The current episode started more than 1 year ago. The problem has been resolved since onset. Irritative symptoms do not include frequency or nocturia (none since "I'm taking flomax"). Pertinent negatives include no hematuria, hesitancy, nausea or vomiting. Past treatments include tamsulosin. The treatment provided moderate relief.  Diabetic Neuropathy PT currently taking Lyrica 100 mg TID that is working great at this time. PT states he has burning pain of 5 out 10 in bilateral feet in the mornings.     Review of Systems  Constitutional: Negative.   HENT: Negative.   Eyes: Negative for blurred vision.  Respiratory: Negative.  Negative for shortness of breath.   Cardiovascular: Negative.  Negative for palpitations.  Gastrointestinal: Negative.  Negative for nausea and vomiting.  Endocrine: Negative.   Genitourinary: Negative.  Negative for frequency, hematuria, hesitancy and nocturia (none since "I'm taking flomax").  Musculoskeletal: Negative.  Negative for myalgias.  Neurological: Negative.  Negative for dizziness and headaches.  Hematological: Negative.   Psychiatric/Behavioral: Negative.  Negative for confusion.  All other systems reviewed and are negative.      Objective:   Physical Exam  Constitutional: He is oriented to person, place, and time. He appears well-developed and well-nourished. No distress.  HENT:  Head: Normocephalic.  Right Ear: External ear normal.  Left Ear: External ear normal.  Nose: Nose normal.  Mouth/Throat: Oropharynx is clear and  moist.  Eyes: Pupils are equal, round,  and reactive to light. Right eye exhibits no discharge. Left eye exhibits no discharge.  Neck: Normal range of motion. Neck supple. No thyromegaly present.  Cardiovascular: Normal rate, regular rhythm, normal heart sounds and intact distal pulses.   No murmur heard. Pulmonary/Chest: Effort normal and breath sounds normal. No respiratory distress. He has no wheezes.  Abdominal: Soft. Bowel sounds are normal. He exhibits no distension. There is no tenderness.  Musculoskeletal: Normal range of motion. He exhibits no edema or tenderness.  Neurological: He is alert and oriented to person, place, and time. He has normal reflexes. No cranial nerve deficit.  Skin: Skin is warm and dry. No rash noted. No erythema.  Psychiatric: He has a normal mood and affect. His behavior is normal. Judgment and thought content normal.  Vitals reviewed.     BP 124/73   Pulse 77   Temp 97.1 F (36.2 C) (Oral)   Ht 5' 9"  (1.753 m)   Wt 183 lb (83 kg)   BMI 27.02 kg/m      Assessment & Plan:  1. Type 2 diabetes mellitus with hyperglycemia, without long-term current use of insulin (HCC) - CMP14+EGFR - glimepiride (AMARYL) 2 MG tablet; Take 1 tablet (2 mg total) by mouth every morning.  Dispense: 90 tablet; Refill: 2 - metFORMIN (GLUCOPHAGE) 1000 MG tablet; Take 1 tablet (1,000 mg total) by mouth 2 (two) times daily with a meal.  Dispense: 180 tablet; Refill: 3  2. Hyperlipidemia, unspecified hyperlipidemia type - CMP14+EGFR - Lipid panel - simvastatin (ZOCOR) 20 MG tablet; Take 1 tablet (20 mg total) by mouth at bedtime.  Dispense: 90 tablet; Refill: 1  3. Essential hypertension - CMP14+EGFR - losartan (COZAAR) 100 MG tablet; TAKE ONE (1) TABLET EACH DAY  Dispense: 90 tablet; Refill: 2  4. Benign prostatic hyperplasia with lower urinary tract symptoms, symptom details unspecified - CMP14+EGFR - tamsulosin (FLOMAX) 0.4 MG CAPS capsule; TAKE ONE (1) CAPSULE EACH DAY   Dispense: 90 capsule; Refill: 1  5. Morbid obesity (HCC) - CMP14+EGFR  6. Vitamin D deficiency - CMP14+EGFR - VITAMIN D 25 Hydroxy (Vit-D Deficiency, Fractures)  7. Insomnia, unspecified type  - traZODone (DESYREL) 50 MG tablet; Take 0.5-1 tablets (25-50 mg total) by mouth at bedtime as needed for sleep.  Dispense: 60 tablet; Refill: 3  8. Diabetic polyneuropathy associated with type 2 diabetes mellitus (HCC - glimepiride (AMARYL) 2 MG tablet; Take 1 tablet (2 mg total) by mouth every morning.  Dispense: 90 tablet; Refill: 2 - pregabalin (LYRICA) 100 MG capsule; Take 1 capsule (100 mg total) by mouth 3 (three) times daily.  Dispense: 90 capsule; Refill: 3 - metFORMIN (GLUCOPHAGE) 1000 MG tablet; Take 1 tablet (1,000 mg total) by mouth 2 (two) times daily with a meal.  Dispense: 180 tablet; Refill: 3   Continue all meds Labs pending Health Maintenance reviewed Diet and exercise encouraged RTO 4 months   Evelina Dun, FNP

## 2016-08-08 ENCOUNTER — Other Ambulatory Visit: Payer: Self-pay | Admitting: Family

## 2016-08-08 ENCOUNTER — Telehealth: Payer: Self-pay | Admitting: Family

## 2016-08-08 DIAGNOSIS — G4700 Insomnia, unspecified: Secondary | ICD-10-CM

## 2016-08-08 DIAGNOSIS — E785 Hyperlipidemia, unspecified: Secondary | ICD-10-CM

## 2016-08-08 DIAGNOSIS — E1142 Type 2 diabetes mellitus with diabetic polyneuropathy: Secondary | ICD-10-CM

## 2016-08-08 DIAGNOSIS — N401 Enlarged prostate with lower urinary tract symptoms: Secondary | ICD-10-CM

## 2016-08-08 DIAGNOSIS — I10 Essential (primary) hypertension: Secondary | ICD-10-CM

## 2016-08-08 DIAGNOSIS — E1165 Type 2 diabetes mellitus with hyperglycemia: Secondary | ICD-10-CM

## 2016-08-08 LAB — LIPID PANEL
CHOL/HDL RATIO: 4 ratio (ref 0.0–5.0)
Cholesterol, Total: 147 mg/dL (ref 100–199)
HDL: 37 mg/dL — ABNORMAL LOW (ref >39)
LDL CALC: 74 mg/dL (ref 0–99)
Triglycerides: 182 mg/dL — ABNORMAL HIGH (ref 0–149)
VLDL Cholesterol Cal: 36 mg/dL (ref 5–40)

## 2016-08-08 LAB — CMP14+EGFR
A/G RATIO: 2.2 (ref 1.2–2.2)
ALBUMIN: 4.8 g/dL (ref 3.6–4.8)
ALT: 15 IU/L (ref 0–44)
AST: 13 IU/L (ref 0–40)
Alkaline Phosphatase: 57 IU/L (ref 39–117)
BUN / CREAT RATIO: 15 (ref 10–24)
BUN: 12 mg/dL (ref 8–27)
Bilirubin Total: <0.2 mg/dL (ref 0.0–1.2)
CALCIUM: 9.8 mg/dL (ref 8.6–10.2)
CO2: 24 mmol/L (ref 18–29)
CREATININE: 0.79 mg/dL (ref 0.76–1.27)
Chloride: 102 mmol/L (ref 96–106)
GFR, EST AFRICAN AMERICAN: 109 mL/min/{1.73_m2} (ref >59)
GFR, EST NON AFRICAN AMERICAN: 94 mL/min/{1.73_m2} (ref >59)
GLOBULIN, TOTAL: 2.2 g/dL (ref 1.5–4.5)
Glucose: 103 mg/dL — ABNORMAL HIGH (ref 65–99)
Potassium: 4.2 mmol/L (ref 3.5–5.2)
SODIUM: 143 mmol/L (ref 134–144)
Total Protein: 7 g/dL (ref 6.0–8.5)

## 2016-08-08 LAB — VITAMIN D 25 HYDROXY (VIT D DEFICIENCY, FRACTURES): VIT D 25 HYDROXY: 40.6 ng/mL (ref 30.0–100.0)

## 2016-08-08 MED ORDER — TRAZODONE HCL 50 MG PO TABS: 25.0000 mg | ORAL_TABLET | Freq: Every evening | ORAL | 3 refills | Status: DC | PRN

## 2016-08-08 MED ORDER — SIMVASTATIN 20 MG PO TABS: 20.0000 mg | ORAL_TABLET | Freq: Every day | ORAL | 1 refills | Status: DC

## 2016-08-08 MED ORDER — TAMSULOSIN HCL 0.4 MG PO CAPS: ORAL_CAPSULE | ORAL | 1 refills | Status: DC

## 2016-08-08 MED ORDER — METFORMIN HCL 1000 MG PO TABS: 1000.0000 mg | ORAL_TABLET | Freq: Two times a day (BID) | ORAL | 3 refills | Status: DC

## 2016-08-08 MED ORDER — PREGABALIN 100 MG PO CAPS: 100.0000 mg | ORAL_CAPSULE | Freq: Three times a day (TID) | ORAL | 3 refills | Status: DC

## 2016-08-08 MED ORDER — GLIMEPIRIDE 2 MG PO TABS: 2.0000 mg | ORAL_TABLET | Freq: Every morning | ORAL | 2 refills | Status: DC

## 2016-08-08 MED ORDER — LOSARTAN POTASSIUM 100 MG PO TABS: ORAL_TABLET | ORAL | 2 refills | Status: DC

## 2016-08-08 NOTE — Telephone Encounter (Signed)
Patient states that he needs all rx's sent to [facility] and not [facility]. Re sent all rx from yesterday and canceled ones at [facility].

## 2016-08-12 ENCOUNTER — Telehealth: Payer: Self-pay | Admitting: Family

## 2016-08-12 NOTE — Telephone Encounter (Signed)
Lm , please call.

## 2016-08-14 NOTE — Telephone Encounter (Signed)
Aware of results. 

## 2016-09-04 ENCOUNTER — Telehealth: Payer: Self-pay | Admitting: Family

## 2016-09-04 DIAGNOSIS — E1142 Type 2 diabetes mellitus with diabetic polyneuropathy: Secondary | ICD-10-CM

## 2016-09-04 DIAGNOSIS — E1165 Type 2 diabetes mellitus with hyperglycemia: Secondary | ICD-10-CM

## 2016-09-04 NOTE — Telephone Encounter (Signed)
No, pt can still take 1000AM (1 tab) and 1500mg  (1 1/2 tabs) in evening.

## 2016-09-04 NOTE — Telephone Encounter (Signed)
lmtcb

## 2016-09-04 NOTE — Telephone Encounter (Signed)
It looks like patient has been on Metformin 500mg  2 every am and 3 every pm. Were you changing to 1000mg  BID?

## 2016-09-05 ENCOUNTER — Encounter: Payer: Self-pay | Admitting: *Deleted

## 2016-09-05 MED ORDER — METFORMIN HCL 500 MG PO TABS: ORAL_TABLET | ORAL | 1 refills | Status: DC

## 2016-09-05 NOTE — Telephone Encounter (Signed)
Patient's wife organizes his pillbox and would prefer to keep him on Metformin 500mg  strength because that it what she is accustomed to. She understands that the milligrams are the same and that Neysa BonitoChristy is not changing his dosage but she doesn't want to change the way she is doing it. [facility] are [facility] the medication back. I will send in a new prescription with the old instructions so that this is not a problem in the future.

## 2016-09-05 NOTE — Addendum Note (Signed)
Addended by: Almeta MonasSTONE, Boykin Baetz M on: 09/05/2016 11:31 AM   Modules accepted: Orders

## 2016-09-11 ENCOUNTER — Encounter: Payer: Self-pay | Admitting: Family Medicine

## 2016-09-11 ENCOUNTER — Ambulatory Visit (INDEPENDENT_AMBULATORY_CARE_PROVIDER_SITE_OTHER): Payer: Medicare HMO | Admitting: Family Medicine

## 2016-09-11 VITALS — BP 103/66 | HR 124 | Temp 102.6°F | Ht 69.0 in | Wt 186.0 lb

## 2016-09-11 DIAGNOSIS — R6889 Other general symptoms and signs: Secondary | ICD-10-CM | POA: Diagnosis not present

## 2016-09-11 LAB — VERITOR FLU A/B WAIVED
INFLUENZA A: NEGATIVE
Influenza B: NEGATIVE

## 2016-09-11 MED ORDER — OSELTAMIVIR PHOSPHATE 75 MG PO CAPS: 75.0000 mg | ORAL_CAPSULE | Freq: Two times a day (BID) | ORAL | 0 refills | Status: DC

## 2016-09-11 NOTE — Progress Notes (Signed)
BP 103/66   Pulse (!) 124   Temp (!) 102.6 F (39.2 C) (Oral)   Ht 5\' 9"  (1.753 m)   Wt 186 lb (84.4 kg)   BMI 27.47 kg/m    Subjective:    Patient ID: Barry JubileeBill Glenn Langill Sr., male    DOB: 1950-10-01, 66 y.o.   MRN: [id number]  HPI: Barry JubileeBill Glenn Winzer Sr. is a 66 y.o. male presenting on 09/11/2016 for Generalized Body Aches; Cough; Extremity Weakness; and Fever   HPI Cough and fever and congestion and body aches Patient comes in today presenting with cough and fever and body aches and chills that has been going on for the past 2 days. He says about 4 days ago somebody at his workplace was ill and diagnosed with influenza and he thinks he may have gotten that. He says he is felt warmer than he is currently in his temperature right now is 102.6. He denies any shortness of breath or wheezing. He has been having chills and body aches all the time since the past couple days. He has tried some Robitussin which did not help significantly.  Relevant past medical, surgical, family and social history reviewed and updated as indicated. Interim medical history since our last visit reviewed. Allergies and medications reviewed and updated.  Review of Systems  Constitutional: Positive for chills and fever.  HENT: Positive for congestion, postnasal drip, rhinorrhea, sinus pressure and sore throat. Negative for ear discharge, ear pain, sneezing and voice change.   Eyes: Negative for pain, discharge, redness and visual disturbance.  Respiratory: Positive for cough. Negative for chest tightness, shortness of breath and wheezing.   Cardiovascular: Negative for chest pain and leg swelling.  Gastrointestinal: Negative for abdominal pain, constipation and diarrhea.  Genitourinary: Negative for difficulty urinating.  Musculoskeletal: Positive for myalgias. Negative for back pain and gait problem.  Skin: Negative for rash.  Neurological: Negative for syncope, light-headedness and headaches.  All other  systems reviewed and are negative.   Per HPI unless specifically indicated above      Objective:    BP 103/66   Pulse (!) 124   Temp (!) 102.6 F (39.2 C) (Oral)   Ht 5\' 9"  (1.753 m)   Wt 186 lb (84.4 kg)   BMI 27.47 kg/m   Wt Readings from Last 3 Encounters:  09/11/16 186 lb (84.4 kg)  08/07/16 183 lb (83 kg)  03/14/16 185 lb 9.6 oz (84.2 kg)    Physical Exam  Constitutional: He is oriented to person, place, and time. He appears well-developed and well-nourished. No distress.  HENT:  Right Ear: Tympanic membrane, external ear and ear canal normal.  Left Ear: Tympanic membrane, external ear and ear canal normal.  Nose: Mucosal edema and rhinorrhea present. No sinus tenderness. No epistaxis. Right sinus exhibits maxillary sinus tenderness. Right sinus exhibits no frontal sinus tenderness. Left sinus exhibits maxillary sinus tenderness. Left sinus exhibits no frontal sinus tenderness.  Mouth/Throat: Uvula is midline and mucous membranes are normal. Posterior oropharyngeal edema and posterior oropharyngeal erythema present. No oropharyngeal exudate or tonsillar abscesses.  Eyes: Conjunctivae are normal. Right eye exhibits no discharge. Left eye exhibits no discharge. No scleral icterus.  Neck: Neck supple. No thyromegaly present.  Cardiovascular: Normal rate, regular rhythm, normal heart sounds and intact distal pulses.   No murmur heard. Pulmonary/Chest: Effort normal and breath sounds normal. No respiratory distress. He has no wheezes. He has no rales.  Musculoskeletal: Normal range of motion. He exhibits no edema.  Lymphadenopathy:    He has no cervical adenopathy.  Neurological: He is alert and oriented to person, place, and time. Coordination normal.  Skin: Skin is warm and dry. No rash noted. He is not diaphoretic.  Psychiatric: He has a normal mood and affect. His behavior is normal.  Nursing note and vitals reviewed.  Influenza: Rapid test negative    Assessment &  Plan:   Problem List Items Addressed This Visit    None    Visit Diagnoses    Flu-like symptoms    -  Primary   Relevant Medications   oseltamivir (TAMIFLU) 75 MG capsule   Other Relevant Orders   Veritor Flu A/B Waived     Will treat symptomatically  Follow up plan: Return if symptoms worsen or fail to improve.  Counseling provided for all of the vaccine components Orders Placed This Encounter  Procedures  . Veritor Flu A/B Waived    Arville Care, MD [facility] 09/11/2016, 8:57 AM

## 2016-09-15 ENCOUNTER — Ambulatory Visit (INDEPENDENT_AMBULATORY_CARE_PROVIDER_SITE_OTHER): Payer: Medicare HMO | Admitting: Family Medicine

## 2016-09-15 ENCOUNTER — Encounter: Payer: Self-pay | Admitting: Family Medicine

## 2016-09-15 VITALS — BP 109/67 | HR 100 | Temp 98.0°F | Ht 69.0 in | Wt 187.0 lb

## 2016-09-15 DIAGNOSIS — J181 Lobar pneumonia, unspecified organism: Secondary | ICD-10-CM | POA: Diagnosis not present

## 2016-09-15 DIAGNOSIS — J189 Pneumonia, unspecified organism: Secondary | ICD-10-CM

## 2016-09-15 MED ORDER — GUAIFENESIN-CODEINE 100-10 MG/5ML PO SOLN: 5.0000 mL | Freq: Three times a day (TID) | ORAL | 0 refills | Status: DC | PRN

## 2016-09-15 MED ORDER — LEVOFLOXACIN 500 MG PO TABS: 500.0000 mg | ORAL_TABLET | Freq: Every day | ORAL | 0 refills | Status: DC

## 2016-09-15 NOTE — Patient Instructions (Addendum)
Great to see you!   Community-Acquired Pneumonia, Adult Pneumonia is an infection of the lungs. There are different types of pneumonia. One type can develop while a person is in a hospital. A different type, called community-acquired pneumonia, develops in people who are not, or have not recently been, in the hospital or other health care facility. What are the causes? Pneumonia may be caused by bacteria, viruses, or funguses. Community-acquired pneumonia is often caused by Streptococcus pneumonia bacteria. These bacteria are often passed from one person to another by breathing in droplets from the cough or sneeze of an infected person. What increases the risk? The condition is more likely to develop in:  People who havechronic diseases, such as chronic obstructive pulmonary disease (COPD), asthma, congestive heart failure, cystic fibrosis, diabetes, or kidney disease.  People who haveearly-stage or late-stage HIV.  People who havesickle cell disease.  People who havehad their spleen removed (splenectomy).  People who havepoor dental hygiene.  People who havemedical conditions that increase the risk of breathing in (aspirating) secretions their own mouth and nose.  People who havea weakened immune system (immunocompromised).  People who smoke.  People whotravel to areas where pneumonia-causing germs commonly exist.  People whoare around animal habitats or animals that have pneumonia-causing germs, including birds, bats, rabbits, cats, and farm animals.  What are the signs or symptoms? Symptoms of this condition include:  Adry cough.  A wet (productive) cough.  Fever.  Sweating.  Chest pain, especially when breathing deeply or coughing.  Rapid breathing or difficulty breathing.  Shortness of breath.  Shaking chills.  Fatigue.  Muscle aches.  How is this diagnosed? Your health care provider will take a medical history and perform a physical exam. You may  also have other tests, including:  Imaging studies of your chest, including X-rays.  Tests to check your blood oxygen level and other blood gases.  Other tests on blood, mucus (sputum), fluid around your lungs (pleural fluid), and urine.  If your pneumonia is severe, other tests may be done to identify the specific cause of your illness. How is this treated? The type of treatment that you receive depends on many factors, such as the cause of your pneumonia, the medicines you take, and other medical conditions that you have. For most adults, treatment and recovery from pneumonia may occur at home. In some cases, treatment must happen in a hospital. Treatment may include:  Antibiotic medicines, if the pneumonia was caused by bacteria.  Antiviral medicines, if the pneumonia was caused by a virus.  Medicines that are given by mouth or through an IV tube.  Oxygen.  Respiratory therapy.  Although rare, treating severe pneumonia may include:  Mechanical ventilation. This is done if you are not breathing well on your own and you cannot maintain a safe blood oxygen level.  Thoracentesis. This procedureremoves fluid around one lung or both lungs to help you breathe better.  Follow these instructions at home:  Take over-the-counter and prescription medicines only as told by your health care provider. ? Only takecough medicine if you are losing sleep. Understand that cough medicine can prevent your body's natural ability to remove mucus from your lungs. ? If you were prescribed an antibiotic medicine, take it as told by your health care provider. Do not stop taking the antibiotic even if you start to feel better.  Sleep in a semi-upright position at night. Try sleeping in a reclining chair, or place a few pillows under your head.  Do not   use tobacco products, including cigarettes, chewing tobacco, and e-cigarettes. If you need help quitting, ask your health care provider.  Drink enough  water to keep your urine clear or pale yellow. This will help to thin out mucus secretions in your lungs. How is this prevented? There are ways that you can decrease your risk of developing community-acquired pneumonia. Consider getting a pneumococcal vaccine if:  You are older than 65 years of age.  You are older than 66 years of age and are undergoing cancer treatment, have chronic lung disease, or have other medical conditions that affect your immune system. Ask your health care provider if this applies to you.  There are different types and schedules of pneumococcal vaccines. Ask your health care provider which vaccination option is best for you. You may also prevent community-acquired pneumonia if you take these actions:  Get an influenza vaccine every year. Ask your health care provider which type of influenza vaccine is best for you.  Go to the dentist on a regular basis.  Wash your hands often. Use hand sanitizer if soap and water are not available.  Contact a health care provider if:  You have a fever.  You are losing sleep because you cannot control your cough with cough medicine. Get help right away if:  You have worsening shortness of breath.  You have increased chest pain.  Your sickness becomes worse, especially if you are an older adult or have a weakened immune system.  You cough up blood. This information is not intended to replace advice given to you by your health care provider. Make sure you discuss any questions you have with your health care provider. Document Released: 07/14/2005 Document Revised: 11/22/2015 Document Reviewed: 11/08/2014 Elsevier Interactive Patient Education  2017 Elsevier Inc.  

## 2016-09-15 NOTE — Progress Notes (Signed)
   HPI  Patient presents today here with persistent cough.  Patient was seen on February 15, 4 days ago and treated. Please for influenza with Tamiflu. He has improved some, however he has a persistent cough. He does have mild dyspnea at times. He states that his cough is so severe that he his having abdominal pain from it. He does not have abdominal pain separated.  He has had some episodes of diarrhea, however no episodes of emesis or nausea. He is tolerating food and fluids normally.  He denies any ear pain, sore throat.  PMH: Smoking status noted ROS: Per HPI  Objective: BP 109/67   Pulse 100   Temp 98 F (36.7 C) (Oral)   Ht 5\' 9"  (1.753 m)   Wt 187 lb (84.8 kg)   BMI 27.62 kg/m  Gen: NAD, alert, cooperative with exam HEENT: NCAT, oropharynx moist and clear CV: RRR, good S1/S2, no murmur Resp: Nonlabored, good air movement, left upper lung field with rhonchi and persistent wheeze Ext: No edema, warm Neuro: Alert and oriented, No gross deficits  Assessment and plan:  # CAP Treat with levaquin, cautioned about hypoglycemia, pt will monitor and half glyburide or hold if needed.  Likely post influenza- CAP Cough syrup with codeine for nighttime cough, explained not to drive or go to work afterwards Note written for work, 2-3 days additional Low threshold for follow-up if symptoms do not improve     Meds ordered this encounter  Medications  . DISCONTD: levofloxacin (LEVAQUIN) 500 MG tablet    Sig: Take 1 tablet (500 mg total) by mouth daily.    Dispense:  7 tablet    Refill:  0  . guaiFENesin-codeine 100-10 MG/5ML syrup    Sig: Take 5 mLs by mouth 3 (three) times daily as needed for cough.    Dispense:  120 mL    Refill:  0  . levofloxacin (LEVAQUIN) 500 MG tablet    Sig: Take 1 tablet (500 mg total) by mouth daily.    Dispense:  7 tablet    Refill:  0    Murtis SinkSam Baine Decesare, MD [facility] 09/15/2016, 11:57 AM

## 2016-10-27 ENCOUNTER — Other Ambulatory Visit: Payer: Self-pay | Admitting: Family

## 2016-10-27 DIAGNOSIS — E1142 Type 2 diabetes mellitus with diabetic polyneuropathy: Secondary | ICD-10-CM

## 2016-10-27 DIAGNOSIS — E1165 Type 2 diabetes mellitus with hyperglycemia: Secondary | ICD-10-CM

## 2016-12-05 ENCOUNTER — Ambulatory Visit: Payer: Medicare HMO | Admitting: Family

## 2016-12-18 ENCOUNTER — Other Ambulatory Visit: Payer: Self-pay | Admitting: Family

## 2016-12-18 DIAGNOSIS — E785 Hyperlipidemia, unspecified: Secondary | ICD-10-CM

## 2016-12-19 ENCOUNTER — Other Ambulatory Visit: Payer: Self-pay | Admitting: *Deleted

## 2016-12-19 MED ORDER — BLOOD GLUCOSE METER KIT: PACK | 0 refills | Status: DC

## 2016-12-19 MED ORDER — GLUCOSE BLOOD VI STRP: ORAL_STRIP | 5 refills | Status: DC

## 2016-12-19 MED ORDER — ACCU-CHEK SOFTCLIX LANCETS MISC: 5 refills | Status: DC

## 2016-12-25 ENCOUNTER — Other Ambulatory Visit: Payer: Self-pay | Admitting: Family

## 2016-12-25 DIAGNOSIS — E1142 Type 2 diabetes mellitus with diabetic polyneuropathy: Secondary | ICD-10-CM

## 2016-12-25 NOTE — Telephone Encounter (Signed)
Last filled 11/07/16, last seen 08/07/16

## 2017-01-22 ENCOUNTER — Ambulatory Visit (INDEPENDENT_AMBULATORY_CARE_PROVIDER_SITE_OTHER): Payer: Medicare HMO | Admitting: Family

## 2017-01-22 ENCOUNTER — Encounter: Payer: Self-pay | Admitting: Family

## 2017-01-22 VITALS — BP 119/72 | HR 74 | Temp 97.0°F | Ht 69.0 in | Wt 184.2 lb

## 2017-01-22 DIAGNOSIS — E785 Hyperlipidemia, unspecified: Secondary | ICD-10-CM | POA: Diagnosis not present

## 2017-01-22 DIAGNOSIS — I10 Essential (primary) hypertension: Secondary | ICD-10-CM | POA: Diagnosis not present

## 2017-01-22 DIAGNOSIS — E1165 Type 2 diabetes mellitus with hyperglycemia: Secondary | ICD-10-CM | POA: Diagnosis not present

## 2017-01-22 DIAGNOSIS — Z1211 Encounter for screening for malignant neoplasm of colon: Secondary | ICD-10-CM

## 2017-01-22 DIAGNOSIS — E559 Vitamin D deficiency, unspecified: Secondary | ICD-10-CM | POA: Diagnosis not present

## 2017-01-22 DIAGNOSIS — N401 Enlarged prostate with lower urinary tract symptoms: Secondary | ICD-10-CM | POA: Diagnosis not present

## 2017-01-22 DIAGNOSIS — Z23 Encounter for immunization: Secondary | ICD-10-CM

## 2017-01-22 DIAGNOSIS — G4700 Insomnia, unspecified: Secondary | ICD-10-CM

## 2017-01-22 DIAGNOSIS — E1142 Type 2 diabetes mellitus with diabetic polyneuropathy: Secondary | ICD-10-CM

## 2017-01-22 LAB — CMP14+EGFR
ALBUMIN: 4.7 g/dL (ref 3.6–4.8)
ALK PHOS: 53 IU/L (ref 39–117)
ALT: 16 IU/L (ref 0–44)
AST: 19 IU/L (ref 0–40)
Albumin/Globulin Ratio: 2 (ref 1.2–2.2)
BILIRUBIN TOTAL: 0.3 mg/dL (ref 0.0–1.2)
BUN / CREAT RATIO: 11 (ref 10–24)
BUN: 10 mg/dL (ref 8–27)
CHLORIDE: 102 mmol/L (ref 96–106)
CO2: 23 mmol/L (ref 20–29)
Calcium: 9.6 mg/dL (ref 8.6–10.2)
Creatinine, Ser: 0.88 mg/dL (ref 0.76–1.27)
GFR calc Af Amer: 103 mL/min/{1.73_m2} (ref >59)
GFR calc non Af Amer: 90 mL/min/{1.73_m2} (ref >59)
GLUCOSE: 136 mg/dL — AB (ref 65–99)
Globulin, Total: 2.3 g/dL (ref 1.5–4.5)
Potassium: 4.8 mmol/L (ref 3.5–5.2)
Sodium: 141 mmol/L (ref 134–144)
Total Protein: 7 g/dL (ref 6.0–8.5)

## 2017-01-22 LAB — LIPID PANEL
CHOLESTEROL TOTAL: 140 mg/dL (ref 100–199)
Chol/HDL Ratio: 3.8 ratio (ref 0.0–5.0)
HDL: 37 mg/dL — ABNORMAL LOW (ref >39)
LDL Calculated: 82 mg/dL (ref 0–99)
Triglycerides: 103 mg/dL (ref 0–149)
VLDL CHOLESTEROL CAL: 21 mg/dL (ref 5–40)

## 2017-01-22 LAB — BAYER DCA HB A1C WAIVED: HB A1C: 7.3 % — AB (ref <7.0)

## 2017-01-22 NOTE — Addendum Note (Signed)
Addended by: Almeta MonasSTONE, Karina Lenderman M on: 01/22/2017 09:16 AM   Modules accepted: Orders

## 2017-01-22 NOTE — Progress Notes (Signed)
Subjective:    Patient ID: Barry Smith., male    DOB: [birth year], 66 y.o.   MRN: [id number]  Pt presents to the office today for chronic follow up.  Diabetes  He presents for his follow-up diabetic visit. He has type 2 diabetes mellitus. His disease course has been stable. There are no hypoglycemic associated symptoms. Associated symptoms include foot paresthesias. Pertinent negatives for diabetes include no blurred vision and no visual change. There are no hypoglycemic complications. Symptoms are stable. Diabetic complications include peripheral neuropathy. Pertinent negatives for diabetic complications include no CVA, heart disease or nephropathy. He is following a generally unhealthy diet. (PT does not check his BS at home) Eye exam is not current.  Hypertension  This is a chronic problem. The current episode started more than 1 year ago. The problem has been resolved since onset. The problem is controlled. Pertinent negatives include no blurred vision, malaise/fatigue, peripheral edema or shortness of breath. Risk factors for coronary artery disease include diabetes mellitus, dyslipidemia, obesity and sedentary lifestyle. The current treatment provides moderate improvement. There is no history of kidney disease, CAD/MI or CVA.  Hyperlipidemia  This is a chronic problem. The current episode started more than 1 year ago. The problem is controlled. He has no history of obesity. Pertinent negatives include no shortness of breath. Current antihyperlipidemic treatment includes statins. The current treatment provides moderate improvement of lipids. Risk factors for coronary artery disease include diabetes mellitus, dyslipidemia, male sex and obesity.  Insomnia  Primary symptoms: no difficulty falling asleep, no malaise/fatigue.  The current episode started more than one year. The onset quality is gradual. The problem occurs intermittently. The problem has been waxing and waning since onset.    Benign Prostatic Hypertrophy  This is a chronic problem. The current episode started more than 1 year ago. The problem has been gradually improving since onset. Irritative symptoms do not include frequency, nocturia or urgency. The treatment provided moderate relief.  Diabetic Neuropathy  Pt is taking Lyrica 100 mg TID and is helping great! Pt states he is not having any pain now.     Review of Systems  Constitutional: Negative for malaise/fatigue.  Eyes: Negative for blurred vision.  Respiratory: Negative for shortness of breath.   Genitourinary: Negative for frequency, nocturia and urgency.  Psychiatric/Behavioral: The patient has insomnia.   All other systems reviewed and are negative.      Objective:   Physical Exam  Constitutional: He is oriented to person, place, and time. He appears well-developed and well-nourished. No distress.  HENT:  Head: Normocephalic.  Right Ear: External ear normal.  Left Ear: External ear normal.  Nose: Nose normal.  Mouth/Throat: Oropharynx is clear and moist.  Eyes: Pupils are equal, round, and reactive to light. Right eye exhibits no discharge. Left eye exhibits no discharge.  Neck: Normal range of motion. Neck supple. No thyromegaly present.  Cardiovascular: Normal rate, regular rhythm, normal heart sounds and intact distal pulses.   No murmur heard. Pulmonary/Chest: Effort normal and breath sounds normal. No respiratory distress. He has no wheezes.  Abdominal: Soft. Bowel sounds are normal. He exhibits no distension. There is no tenderness.  Musculoskeletal: Normal range of motion. He exhibits no edema or tenderness.  Neurological: He is alert and oriented to person, place, and time.  Skin: Skin is warm and dry. No rash noted. No erythema.  Psychiatric: He has a normal mood and affect. His behavior is normal. Judgment and thought content normal.  Vitals reviewed.  BP 119/72   Pulse 74   Temp 97 F (36.1 C) (Oral)   Ht 5' 9"   (1.753 m)   Wt 184 lb 3.2 oz (83.6 kg)   BMI 27.20 kg/m      Assessment & Plan:  1. Essential hypertension - CMP14+EGFR  2. Type 2 diabetes mellitus with hyperglycemia, without long-term current use of insulin ([place]) - Ambulatory referral to Ophthalmology - Bayer DCA Hb A1c Waived - CMP14+EGFR - Microalbumin / creatinine urine ratio  3. Benign prostatic hyperplasia with lower urinary tract symptoms, symptom details unspecifie - CMP14+EGFR  4. Hyperlipidemia, unspecified hyperlipidemia type - CMP14+EGFR - Lipid panel  5. Insomnia, unspecified type - CMP14+EGFR  6. Morbid obesity (HCC) - CMP14+EGFR  7. Vitamin D deficiency - CMP14+EGFR  8. Diabetic polyneuropathy associated with type 2 diabetes mellitus (HCC) - CMP14+EGFR  9. Colon cancer screenin - CMP14+EGFR - Fecal occult blood, imunochemical; Future   Continue all meds Labs pending Health Maintenance reviewed Diet and exercise encouraged RTO 4 months   Evelina Dun, FNP

## 2017-01-22 NOTE — Patient Instructions (Signed)
Diabetes Mellitus and Food It is important for you to manage your blood sugar (glucose) level. Your blood glucose level can be greatly affected by what you eat. Eating healthier foods in the appropriate amounts throughout the day at about the same time each day will help you control your blood glucose level. It can also help slow or prevent worsening of your diabetes mellitus. Healthy eating may even help you improve the level of your blood pressure and reach or maintain a healthy weight. General recommendations for healthful eating and cooking habits include:  Eating meals and snacks regularly. Avoid going long periods of time without eating to lose weight.  Eating a diet that consists mainly of plant-based foods, such as fruits, vegetables, nuts, legumes, and whole grains.  Using low-heat cooking methods, such as baking, instead of high-heat cooking methods, such as deep frying.  Work with your dietitian to make sure you understand how to use the Nutrition Facts information on food labels. How can food affect me? Carbohydrates Carbohydrates affect your blood glucose level more than any other type of food. Your dietitian will help you determine how many carbohydrates to eat at each meal and teach you how to count carbohydrates. Counting carbohydrates is important to keep your blood glucose at a healthy level, especially if you are using insulin or taking certain medicines for diabetes mellitus. Alcohol Alcohol can cause sudden decreases in blood glucose (hypoglycemia), especially if you use insulin or take certain medicines for diabetes mellitus. Hypoglycemia can be a life-threatening condition. Symptoms of hypoglycemia (sleepiness, dizziness, and disorientation) are similar to symptoms of having too much alcohol. If your health care provider has given you approval to drink alcohol, do so in moderation and use the following guidelines:  Women should not have more than one drink per day, and men  should not have more than two drinks per day. One drink is equal to: ? 12 oz of beer. ? 5 oz of wine. ? 1 oz of hard liquor.  Do not drink on an empty stomach.  Keep yourself hydrated. Have water, diet soda, or unsweetened iced tea.  Regular soda, juice, and other mixers might contain a lot of carbohydrates and should be counted.  What foods are not recommended? As you make food choices, it is important to remember that all foods are not the same. Some foods have fewer nutrients per serving than other foods, even though they might have the same number of calories or carbohydrates. It is difficult to get your body what it needs when you eat foods with fewer nutrients. Examples of foods that you should avoid that are high in calories and carbohydrates but low in nutrients include:  Trans fats (most processed foods list trans fats on the Nutrition Facts label).  Regular soda.  Juice.  Candy.  Sweets, such as cake, pie, doughnuts, and cookies.  Fried foods.  What foods can I eat? Eat nutrient-rich foods, which will nourish your body and keep you healthy. The food you should eat also will depend on several factors, including:  The calories you need.  The medicines you take.  Your weight.  Your blood glucose level.  Your blood pressure level.  Your cholesterol level.  You should eat a variety of foods, including:  Protein. ? Lean cuts of meat. ? Proteins low in saturated fats, such as fish, egg whites, and beans. Avoid processed meats.  Fruits and vegetables. ? Fruits and vegetables that may help control blood glucose levels, such as apples,   mangoes, and yams.  Dairy products. ? Choose fat-free or low-fat dairy products, such as milk, yogurt, and cheese.  Grains, bread, pasta, and rice. ? Choose whole grain products, such as multigrain bread, whole oats, and brown rice. These foods may help control blood pressure.  Fats. ? Foods containing healthful fats, such as  nuts, avocado, olive oil, canola oil, and fish.  Does everyone with diabetes mellitus have the same meal plan? Because every person with diabetes mellitus is different, there is not one meal plan that works for everyone. It is very important that you meet with a dietitian who will help you create a meal plan that is just right for you. This information is not intended to replace advice given to you by your health care provider. Make sure you discuss any questions you have with your health care provider. Document Released: 04/10/2005 Document Revised: 12/20/2015 Document Reviewed: 06/10/2013 Elsevier Interactive Patient Education  2017 [employer] Inc.  

## 2017-01-23 ENCOUNTER — Other Ambulatory Visit: Payer: Self-pay | Admitting: Family

## 2017-01-23 LAB — MICROALBUMIN / CREATININE URINE RATIO
Creatinine, Urine: 140.4 mg/dL
MICROALB/CREAT RATIO: 11.2 mg/g{creat} (ref 0.0–30.0)
Microalbumin, Urine: 15.7 ug/mL

## 2017-01-23 MED ORDER — SITAGLIPTIN PHOSPHATE 100 MG PO TABS: 100.0000 mg | ORAL_TABLET | Freq: Every day | ORAL | 1 refills | Status: DC

## 2017-01-27 ENCOUNTER — Other Ambulatory Visit: Payer: Self-pay | Admitting: Family Medicine

## 2017-01-27 DIAGNOSIS — E1165 Type 2 diabetes mellitus with hyperglycemia: Secondary | ICD-10-CM

## 2017-01-27 DIAGNOSIS — E1142 Type 2 diabetes mellitus with diabetic polyneuropathy: Secondary | ICD-10-CM

## 2017-02-10 ENCOUNTER — Other Ambulatory Visit: Payer: Self-pay | Admitting: Family

## 2017-02-10 DIAGNOSIS — N401 Enlarged prostate with lower urinary tract symptoms: Secondary | ICD-10-CM

## 2017-02-19 LAB — HM DIABETES EYE EXAM

## 2017-02-27 ENCOUNTER — Other Ambulatory Visit: Payer: Self-pay | Admitting: Family Medicine

## 2017-03-23 ENCOUNTER — Other Ambulatory Visit: Payer: Self-pay | Admitting: Family Medicine

## 2017-03-23 ENCOUNTER — Other Ambulatory Visit: Payer: Self-pay | Admitting: Family

## 2017-03-23 DIAGNOSIS — E785 Hyperlipidemia, unspecified: Secondary | ICD-10-CM

## 2017-04-04 ENCOUNTER — Telehealth: Payer: Self-pay | Admitting: Family

## 2017-04-06 NOTE — Telephone Encounter (Signed)
Aware.  Samples unavailable.

## 2017-04-29 ENCOUNTER — Encounter: Payer: Self-pay | Admitting: Physician Assistant

## 2017-04-29 ENCOUNTER — Ambulatory Visit (INDEPENDENT_AMBULATORY_CARE_PROVIDER_SITE_OTHER): Payer: Medicare HMO

## 2017-04-29 ENCOUNTER — Ambulatory Visit (INDEPENDENT_AMBULATORY_CARE_PROVIDER_SITE_OTHER): Payer: Medicare HMO | Admitting: Physician Assistant

## 2017-04-29 VITALS — BP 120/78 | HR 92 | Temp 97.7°F | Ht 69.0 in | Wt 185.0 lb

## 2017-04-29 DIAGNOSIS — S62662B Nondisplaced fracture of distal phalanx of right middle finger, initial encounter for open fracture: Secondary | ICD-10-CM | POA: Diagnosis not present

## 2017-04-29 DIAGNOSIS — S6991XA Unspecified injury of right wrist, hand and finger(s), initial encounter: Secondary | ICD-10-CM

## 2017-04-29 DIAGNOSIS — S6010XA Contusion of unspecified finger with damage to nail, initial encounter: Secondary | ICD-10-CM

## 2017-04-29 DIAGNOSIS — Z23 Encounter for immunization: Secondary | ICD-10-CM | POA: Diagnosis not present

## 2017-04-29 DIAGNOSIS — S61312A Laceration without foreign body of right middle finger with damage to nail, initial encounter: Secondary | ICD-10-CM | POA: Diagnosis not present

## 2017-04-29 MED ORDER — CLINDAMYCIN HCL 300 MG PO CAPS: 300.0000 mg | ORAL_CAPSULE | Freq: Three times a day (TID) | ORAL | 0 refills | Status: DC

## 2017-04-29 NOTE — Progress Notes (Signed)
BP 120/78   Pulse 92   Temp 97.7 F (36.5 C) (Oral)   Ht _0  (1.753 m)   Wt 185 lb (83.9 kg)   BMI 27.32 kg/m    Subjective:    Patient ID: Barry Derry., male    DOB: [birth year], 66 y.o.   MRN: [id number]  HPI: Barry Batrez. is a 66 y.o. male presenting on 04/29/2017 for Laceration (right middle finger)  The patient injured his finger 2 days ago. He had a crush injury by height. He has taken care of the wound. He has had significant pain at night. There is a large amount of blood under the fingernail. He has not had any pus or drainage. He does not know about his tetanus status.  Relevant past medical, surgical, family and social history reviewed and updated as indicated. Allergies and medications reviewed and updated.  Past Medical History:  Diagnosis Date  . Anxiety   . Diabetes mellitus without complication ([place])   . Hyperlipidemia   . Hypertension     Past Surgical History:  Procedure Laterality Date  . APPENDECTOMY      Review of Systems  Constitutional: Negative.  Negative for appetite change and fatigue.  HENT: Negative.   Eyes: Negative.  Negative for pain and visual disturbance.  Respiratory: Negative.  Negative for cough, chest tightness, shortness of breath and wheezing.   Cardiovascular: Negative.  Negative for chest pain, palpitations and leg swelling.  Gastrointestinal: Negative.  Negative for abdominal pain, diarrhea, nausea and vomiting.  Endocrine: Negative.   Genitourinary: Negative.   Musculoskeletal: Positive for arthralgias, joint swelling and myalgias.  Skin: Negative.  Negative for color change and rash.  Neurological: Negative.  Negative for weakness, numbness and headaches.  Psychiatric/Behavioral: Negative.     Allergies as of 04/29/2017   No Known Allergies     Medication List       Accurate as of 04/29/17  1:04 PM. Always use your most recent med list.          ACCU-CHEK AVIVA PLUS test strip Generic drug:   glucose blood TEST BLOOD SUGAR FOUR TIMES DAILY   ACCU-CHEK SOFTCLIX LANCETS lancets Test BS QID   aspirin 81 MG tablet Take 81 mg by mouth daily.   blood glucose meter kit and supplies Use up to four times daily. DX E11.65   cholecalciferol 1000 units tablet Commonly known as:  VITAMIN D Take 1 tablet (1,000 Units total) by mouth daily.   clindamycin 300 MG capsule Commonly known as:  CLEOCIN Take 1 capsule (300 mg total) by mouth 3 (three) times daily.   Fish Oil 1200 MG Caps Take 1 capsule by mouth 1 day or 1 dose.   glimepiride 2 MG tablet Commonly known as:  AMARYL Take 1 tablet (2 mg total) by mouth every morning.   losartan 100 MG tablet Commonly known as:  COZAAR TAKE ONE (1) TABLET EACH DAY   LYRICA 100 MG capsule Generic drug:  pregabalin TAKE ONE (1) CAPSULE THREE (3) TIMES EACH DAY   metFORMIN 500 MG tablet Commonly known as:  GLUCOPHAGE TAKE 2 TABLETS EVERY MORNING AND TAKE 3 TABLETS AT BEDTIME   multivitamin capsule Take 1 capsule by mouth daily.   simvastatin 20 MG tablet Commonly known as:  ZOCOR TAKE ONE TABLET DAILY AT BEDTIME   sitaGLIPtin 100 MG tablet Commonly known as:  JANUVIA Take 1 tablet (100 mg total) by mouth daily.   tamsulosin 0.4 MG Caps  capsule Commonly known as:  FLOMAX TAKE ONE (1) CAPSULE EACH DAY   traZODone 50 MG tablet Commonly known as:  DESYREL Take 0.5-1 tablets (25-50 mg total) by mouth at bedtime as needed for sleep.          Objective:    BP 120/78   Pulse 92   Temp 97.7 F (36.5 C) (Oral)   Ht _0  (1.753 m)   Wt 185 lb (83.9 kg)   BMI 27.32 kg/m   No Known Allergies  Physical Exam  Constitutional: He appears well-developed and well-nourished. No distress.  HENT:  Head: Normocephalic and atraumatic.  Eyes: Pupils are equal, round, and reactive to light. Conjunctivae and EOM are normal.  Cardiovascular: Normal rate, regular rhythm and normal heart sounds.   Pulmonary/Chest: Effort normal and  breath sounds normal. No respiratory distress.  Musculoskeletal:       Right hand: He exhibits decreased range of motion, tenderness, bony tenderness, deformity and laceration.       Hands: Patient with open wound on palmar surface that is quite swollen. Sub-ungual hematoma very severe. It is released by hyfrecation. He had very good pain relief. X-ray shows fracture of distal tuft. Splinting of the finger performed and wounds are dressed.  Skin: Skin is warm and dry.  Psychiatric: He has a normal mood and affect. His behavior is normal.  Nursing note and vitals reviewed.   Results for orders placed or performed in visit on 01/22/17  Bayer DCA Hb A1c Waived  Result Value Ref Range   Bayer DCA Hb A1c Waived 7.3 (H) <7.0 %  CMP14+EGFR  Result Value Ref Range   Glucose 136 (H) 65 - 99 mg/dL   BUN 10 8 - 27 mg/dL   Creatinine, Ser 0.88 0.76 - 1.27 mg/dL   GFR calc non Af Amer 90 >59 mL/min/1.73   GFR calc Af Amer 103 >59 mL/min/1.73   BUN/Creatinine Ratio 11 10 - 24   Sodium 141 134 - 144 mmol/L   Potassium 4.8 3.5 - 5.2 mmol/L   Chloride 102 96 - 106 mmol/L   CO2 23 20 - 29 mmol/L   Calcium 9.6 8.6 - 10.2 mg/dL   Total Protein 7.0 6.0 - 8.5 g/dL   Albumin 4.7 3.6 - 4.8 g/dL   Globulin, Total 2.3 1.5 - 4.5 g/dL   Albumin/Globulin Ratio 2.0 1.2 - 2.2   Bilirubin Total 0.3 0.0 - 1.2 mg/dL   Alkaline Phosphatase 53 39 - 117 IU/L   AST 19 0 - 40 IU/L   ALT 16 0 - 44 IU/L  Lipid panel  Result Value Ref Range   Cholesterol, Total 140 100 - 199 mg/dL   Triglycerides 103 0 - 149 mg/dL   HDL 37 (L) >39 mg/dL   VLDL Cholesterol Cal 21 5 - 40 mg/dL   LDL Calculated 82 0 - 99 mg/dL   Chol/HDL Ratio 3.8 0.0 - 5.0 ratio  Microalbumin / creatinine urine ratio  Result Value Ref Range   Creatinine, Urine 140.4 Not Estab. mg/dL   Albumin, Urine 15.7 Not Estab. ug/mL   Microalb/Creat Ratio 11.2 0.0 - 30.0 mg/g creat      Assessment & Plan:   1. Injury of finger of right hand, initial  encounter - DG Finger Middle Right; Future - Ambulatory referral to Orthopedic Surgery  2. Open nondisplaced fracture of distal phalanx of right middle finger, initial encounter - Ambulatory referral to Orthopedic Surgery - clindamycin (CLEOCIN) 300 MG capsule; Take 1  capsule (300 mg total) by mouth 3 (three) times daily.  Dispense: 30 capsule; Refill: 0  3. Subungual hematoma of digit of hand, initial encounter - Ambulatory referral to Orthopedic Surgery  4. Laceration of right middle finger without foreign body with damage to nail, initial encounter - clindamycin (CLEOCIN) 300 MG capsule; Take 1 capsule (300 mg total) by mouth 3 (three) times daily.  Dispense: 30 capsule; Refill: 0  Referral is being made to orthopedist as soon as possible. We are making sure that the orthotic can handle hand injuries of this sort. If not referral to [facility] in [place] will be made.  Current Outpatient Prescriptions:  .  ACCU-CHEK AVIVA PLUS test strip, TEST BLOOD SUGAR FOUR TIMES DAILY, Disp: 350 each, Rfl: 1 .  ACCU-CHEK SOFTCLIX LANCETS lancets, Test BS QID, Disp: 100 each, Rfl: 5 .  aspirin 81 MG tablet, Take 81 mg by mouth daily., Disp: , Rfl:  .  blood glucose meter kit and supplies, Use up to four times daily. DX E11.65, Disp: 1 each, Rfl: 0 .  cholecalciferol (VITAMIN D) 1000 UNITS tablet, Take 1 tablet (1,000 Units total) by mouth daily., Disp: 90 tablet, Rfl: 3 .  glimepiride (AMARYL) 2 MG tablet, Take 1 tablet (2 mg total) by mouth every morning., Disp: 90 tablet, Rfl: 2 .  losartan (COZAAR) 100 MG tablet, TAKE ONE (1) TABLET EACH DAY, Disp: 90 tablet, Rfl: 2 .  LYRICA 100 MG capsule, TAKE ONE (1) CAPSULE THREE (3) TIMES EACH DAY, Disp: 90 capsule, Rfl: 3 .  metFORMIN (GLUCOPHAGE) 500 MG tablet, TAKE 2 TABLETS EVERY MORNING AND TAKE 3 TABLETS AT BEDTIME, Disp: 450 tablet, Rfl: 1 .  Multiple Vitamin (MULTIVITAMIN) capsule, Take 1 capsule by mouth daily., Disp: , Rfl:  .  Omega-3  Fatty Acids (FISH OIL) 1200 MG CAPS, Take 1 capsule by mouth 1 day or 1 dose., Disp: , Rfl:  .  simvastatin (ZOCOR) 20 MG tablet, TAKE ONE TABLET DAILY AT BEDTIME, Disp: 90 tablet, Rfl: 0 .  sitaGLIPtin (JANUVIA) 100 MG tablet, Take 1 tablet (100 mg total) by mouth daily., Disp: 90 tablet, Rfl: 1 .  tamsulosin (FLOMAX) 0.4 MG CAPS capsule, TAKE ONE (1) CAPSULE EACH DAY, Disp: 90 capsule, Rfl: 1 .  traZODone (DESYREL) 50 MG tablet, Take 0.5-1 tablets (25-50 mg total) by mouth at bedtime as needed for sleep., Disp: 60 tablet, Rfl: 3 .  clindamycin (CLEOCIN) 300 MG capsule, Take 1 capsule (300 mg total) by mouth 3 (three) times daily., Disp: 30 capsule, Rfl: 0 Continue all other maintenance medications as listed above.  Follow up plan: Return if symptoms worsen or fail to improve.  Educational handout given for finger fracture and laceration  Terald Sleeper PA-C [facility] [address]  [place], [place] [address] [phone]   04/29/2017, 1:04 PM

## 2017-04-29 NOTE — Addendum Note (Signed)
Addended by: Tamera Punt on: 04/29/2017 02:07 PM   Modules accepted: Orders

## 2017-04-29 NOTE — Progress Notes (Signed)
N

## 2017-04-29 NOTE — Patient Instructions (Addendum)
Finger Fracture A finger fracture is a break in any of the bones of the fingers. What are the causes? The main cause of finger fractures is traumatic injury, such as from:  An injury while playing sports.  A workplace injury.  A fall.  What increases the risk? Activities that can increase your risk of finger fractures include:  Sports.  Workplace activities that involve machinery.  A condition called osteoporosis, which can make your bones less dense and cause them to fracture more easily.  What are the signs or symptoms? The main symptoms of a broken finger are pain, bruising, and swelling shortly after the injury. Other symptoms include:  Bruising of your finger.  Stiffness of your finger.  Exposed bones (compound or open fracture) if the fracture is severe.  How is this diagnosed? This condition is diagnosed based on a physical exam, your medical history, and your symptoms. An X-ray will also be done. How is this treated? Treatment for this condition depends on the severity of the fracture. If the bones are still in place, the finger may be splinted to keep the finger still while it heals (immobilization). If the bones are out of place, they will need to be put back into place. This may be done without surgery, or surgery may be needed. You may also be given exercises to do to regain strength and flexibility (physical therapy). Follow these instructions at home: If you have a splint:  Wear the splint as told by your health care provider. Remove it only as told by your health care provider.  Loosen the splint if your fingers tingle, become numb, or turn cold and blue.  Keep the splint clean.  If the splint is not waterproof: ? Do not let it get wet. ? Cover it with a watertight covering when you take a bath or a shower. Managing pain, stiffness, and swelling  If directed, put ice on the injured area: ? If you have a removable splint, remove it as told by your health  care provider. ? Put ice in a plastic bag. ? Place a towel between your skin and the bag. ? Leave the ice on for 20 minutes, 2-3 times a day.  Move your fingers often to avoid stiffness and to lessen swelling.  Raise (elevate) the injured area above the level of your heart while you are sitting or lying down. Driving  Do not drive or use heavy machinery while taking prescription pain medicine.  Ask your health care provider when it is safe to drive if you have a splint. General instructions  Do not put pressure on any part of the splint until it is fully hardened. This may take several hours.  Do not use any products that contain nicotine or tobacco, such as cigarettes and e-cigarettes. These can delay bone healing. If you need help quitting, ask your health care provider.  Take over-the-counter and prescription medicines only as told by your health care provider.  Do exercises as told by your health care provider.  Keep all follow-up visits as told by your health care provider. This is important. Contact a health care provider if:  Your pain or swelling gets worse even with treatment.  You have trouble moving your finger. Get help right away if:  Your finger becomes numb or blue. Summary  A finger fracture is a break in any of the bones of the fingers.  Traumatic injury is the main cause of finger fractures.  Treatment for this condition   depends on the severity of the fracture. This information is not intended to replace advice given to you by your health care provider. Make sure you discuss any questions you have with your health care provider. Document Released: 10/26/2000 Document Revised: 06/03/2016 Document Reviewed: 06/03/2016 Elsevier Interactive Patient Education  2017 [employer] Deep Skin Avulsion A deep skin avulsion is a type of open wound. It often results from a severe injury (trauma) that tears away all layers of the skin or an entire body part. The areas  of the body that are most often affected by a deep skin avulsion include the face, lips, ears, nose, and fingers. A deep skin avulsion may make structures below the skin become visible. You may be able to see muscle, bone, nerves, and blood vessels. A deep skin avulsion can also damage important structures beneath the skin. These include tendons, ligaments, nerves, or blood vessels. What are the causes? Injuries that often cause a deep skin avulsion include:  Being crushed.  Falling against a jagged surface.  Animal bites.  Gunshot wounds.  Severe burns.  Injuries that involve being dragged, such as bicycle or motorcycle accidents.  What are the signs or symptoms? Symptoms of a deep skin avulsion include:  Pain.  Numbness.  Swelling.  A misshapen body part.  Bleeding, which may be heavy.  Fluid leaking from the wound.  How is this diagnosed? This condition may be diagnosed with a medical history and physical exam. You may also have X-rays done. How is this treated? The treatment that is chosen for a deep skin avulsion depends on how large and deep the wound is and where it is located. Treatment for all types of avulsions usually starts with:  Controlling the bleeding.  Washing out the wound with a germ-free (sterile) salt-water solution.  Removing dead tissue from the wound.  A wound may be closed or left open to heal. This depends on the size and location of the wound and whether it is likely to become infected. Wounds are usually covered or closed if they expose blood vessels, nerves, bone, or cartilage.  Wounds that are small and clean may be closed with stitches (sutures).  Wounds that cannot be closed with sutures may be covered with a piece of skin (graft) or a skin flap. Skin may be taken from on or near the wound, from another part of the body, or from a donor.  Wounds may be left open if they are hard to close or they may become infected. These wounds heal  over time from the bottom up.  You may also receive medicine. This may include:  Antibiotics.  A tetanus shot.  Rabies vaccine.  Follow these instructions at home: Medicines  Take or apply over-the-counter and prescription medicines only as told by your health care provider.  If you were prescribed an antibiotic, take or apply it as told by your health care provider. Do not stop taking the antibiotic even if your condition improves.  You may get anti-itch medicine while your wound is healing. Use it only as told by your health care provider. Wound care  There are many ways to close and cover a wound. For example, a wound can be covered with sutures, skin glue, or adhesive strips. Follow instructions from your health care provider about: ? How to take care of your wound. ? When and how you should change your bandage (dressing). ? When you should remove your dressing. ? Removing whatever was used to close  your wound.  Keep the dressing dry as told by your health care provider. Do not take baths, swim, use a hot tub, or do anything that would put your wound underwater until your health care provider approves.  Clean the wound each day or as told by your health care provider. ? Wash the wound with mild soap and water. ? Rinse the wound with water to remove all soap. ? Pat the wound dry with a clean towel. Do not rub it.  Do not scratch or pick at the wound.  Check your wound every day for signs of infection. Watch for: ? Redness, swelling, or pain. ? Fluid, blood, or pus. General instructions  Raise (elevate) the injured area above the level of your heart while you are sitting or lying down.  Keep all follow-up visits as told by your health care provider. This is important. Contact a health care provider if:  You received a tetanus shot and you have swelling, severe pain, redness, or bleeding at the injection site.  You have a fever.  Your pain is not controlled with  medicine.  You have increased redness, swelling, or pain at the site of your wound.  You have fluid, blood, or pus coming from your wound.  You notice a bad smell coming from your wound or your dressing.  A wound that was closed breaks open.  You notice something coming out of the wound, such as wood or glass.  You notice a change in the color of your skin near your wound.  You develop a new rash.  You need to change the dressing frequently due to fluid, blood, or pus draining from the wound. Get help right away if:  Your pain suddenly increases and is severe.  You develop severe swelling around the wound.  You develop numbness around the wound.  You have nausea and vomiting that does not go away after 24 hours.  You feel light-headed, weak, or faint.  You develop chest pain.  You have trouble breathing.  Your wound is on your hand or foot and you cannot properly move a finger or toe.  The wound is on your hand or foot and you notice that your fingers or toes look pale or bluish.  You have a red streak going away from your wound. This information is not intended to replace advice given to you by your health care provider. Make sure you discuss any questions you have with your health care provider. Document Released: 09/09/2006 Document Revised: 12/20/2015 Document Reviewed: 07/19/2014 Elsevier Interactive Patient Education  [employer].

## 2017-04-30 ENCOUNTER — Ambulatory Visit (INDEPENDENT_AMBULATORY_CARE_PROVIDER_SITE_OTHER): Payer: Medicare HMO | Admitting: Family

## 2017-04-30 ENCOUNTER — Ambulatory Visit: Payer: PRIVATE HEALTH INSURANCE | Admitting: Orthopaedic Surgery

## 2017-04-30 ENCOUNTER — Telehealth: Payer: Self-pay | Admitting: Orthopedic Surgery

## 2017-04-30 ENCOUNTER — Encounter: Payer: Self-pay | Admitting: Family

## 2017-04-30 ENCOUNTER — Emergency Department (HOSPITAL_COMMUNITY)
Admission: EM | Admit: 2017-04-30 | Discharge: 2017-04-30 | Discharge status: Against Medical Advice | Payer: Medicare HMO

## 2017-04-30 VITALS — BP 126/81 | HR 83 | Temp 97.3°F | Ht 69.0 in | Wt 184.4 lb

## 2017-04-30 DIAGNOSIS — E663 Overweight: Secondary | ICD-10-CM | POA: Diagnosis not present

## 2017-04-30 DIAGNOSIS — I10 Essential (primary) hypertension: Secondary | ICD-10-CM

## 2017-04-30 DIAGNOSIS — E785 Hyperlipidemia, unspecified: Secondary | ICD-10-CM | POA: Diagnosis not present

## 2017-04-30 DIAGNOSIS — E559 Vitamin D deficiency, unspecified: Secondary | ICD-10-CM

## 2017-04-30 DIAGNOSIS — G4700 Insomnia, unspecified: Secondary | ICD-10-CM

## 2017-04-30 DIAGNOSIS — E1165 Type 2 diabetes mellitus with hyperglycemia: Secondary | ICD-10-CM | POA: Diagnosis not present

## 2017-04-30 DIAGNOSIS — N401 Enlarged prostate with lower urinary tract symptoms: Secondary | ICD-10-CM

## 2017-04-30 DIAGNOSIS — E1142 Type 2 diabetes mellitus with diabetic polyneuropathy: Secondary | ICD-10-CM

## 2017-04-30 MED ORDER — GABAPENTIN 300 MG PO CAPS: 300.0000 mg | ORAL_CAPSULE | Freq: Three times a day (TID) | ORAL | 1 refills | Status: DC

## 2017-04-30 NOTE — Telephone Encounter (Signed)
Patient had been referred initially to our office by primary care provider at [facility], and requested urgent appointment for problem of Fracture with open wound of right middle finger.  I had scheduled for today with Dr Hilda Lias, however, was then immediately advised that if wound is open, we cannot see here, and to have patient go to Emergency room.  We were unable to catch patient in time as he came in today, 04/30/17, directly from their office.  He has gone on to [facility] Emergency room today, per Dr Hilda Lias, and we did not see him in office. Primary care is aware.    Please review primary care notes and imaging in [facility] chart, and Emergency room notes, and please advise if to schedule here.

## 2017-04-30 NOTE — Progress Notes (Signed)
Subjective:    Patient ID: Barry Smith., male    DOB: May 29, 1951, 66 y.o.   MRN: [id number]  Pt presents to the office today for chronic follow up.  Diabetes  He presents for his follow-up diabetic visit. He has type 2 diabetes mellitus. His disease course has been fluctuating. There are no hypoglycemic associated symptoms. Associated symptoms include foot paresthesias. Pertinent negatives for diabetes include no blurred vision and no visual change. Symptoms are stable. Diabetic complications include peripheral neuropathy. Risk factors for coronary artery disease include dyslipidemia and male sex. He is following a generally healthy diet. Eye exam is current.  Hypertension  This is a chronic problem. The current episode started more than 1 year ago. The problem has been resolved since onset. The problem is controlled. Pertinent negatives include no blurred vision, malaise/fatigue, peripheral edema or shortness of breath. Risk factors for coronary artery disease include diabetes mellitus, dyslipidemia, male gender and sedentary lifestyle. There is no history of kidney disease.  Insomnia  Primary symptoms: difficulty falling asleep, frequent awakening, no malaise/fatigue.  The current episode started more than one year. The onset quality is gradual. The problem occurs nightly. The treatment provided mild relief.  Benign Prostatic Hypertrophy  This is a chronic problem. The current episode started more than 1 year ago. The problem has been waxing and waning since onset. Irritative symptoms do not include frequency or nocturia. Associated symptoms include nausea.  Hyperlipidemia  This is a chronic problem. The current episode started more than 1 year ago. Recent lipid tests were reviewed and are normal. Pertinent negatives include no shortness of breath. Current antihyperlipidemic treatment includes statins. The current treatment provides moderate improvement of lipids. Risk factors for  coronary artery disease include diabetes mellitus, dyslipidemia, male sex and hypertension.  Diabetic Neuropathy Pt currently taking Lyrica 100 mg BID, but is in his "donut hole" and having to pay cash. Pt requesting something cheaper.     Review of Systems  Constitutional: Negative for malaise/fatigue.  Eyes: Negative for blurred vision.  Respiratory: Negative for shortness of breath.   Gastrointestinal: Positive for nausea.  Genitourinary: Negative for frequency and nocturia.  Psychiatric/Behavioral: The patient has insomnia.   All other systems reviewed and are negative.      Objective:   Physical Exam  Constitutional: He is oriented to person, place, and time. He appears well-developed and well-nourished. No distress.  HENT:  Head: Normocephalic.  Right Ear: External ear normal.  Left Ear: External ear normal.  Nose: Nose normal.  Mouth/Throat: Oropharynx is clear and moist.  Eyes: Pupils are equal, round, and reactive to light. Right eye exhibits no discharge. Left eye exhibits no discharge.  Neck: Normal range of motion. Neck supple. No thyromegaly present.  Cardiovascular: Normal rate, regular rhythm, normal heart sounds and intact distal pulses.   No murmur heard. Pulmonary/Chest: Effort normal and breath sounds normal. No respiratory distress. He has no wheezes.  Abdominal: Soft. Bowel sounds are normal. He exhibits no distension. There is no tenderness.  Musculoskeletal: He exhibits no edema or tenderness.  Left middle finger fracture, bandage in place  Neurological: He is alert and oriented to person, place, and time.  Skin: Skin is warm and dry. No rash noted. No erythema.  Psychiatric: He has a normal mood and affect. His behavior is normal. Judgment and thought content normal.  Vitals reviewed.  Diabetic Foot Exam - Simple   Simple Foot Form Diabetic Foot exam was performed with the following findings:  Yes 04/30/2017  9:22 AM  Visual Inspection No deformities,  no ulcerations, no other skin breakdown bilaterally:  Yes Sensation Testing Intact to touch and monofilament testing bilaterally:  Yes Pulse Check Posterior Tibialis and Dorsalis pulse intact bilaterally:  Yes Comments      BP 126/81   Pulse 83   Temp (!) 97.3 F (36.3 C) (Oral)   Ht 5' 9"  (1.753 m)   Wt 184 lb 6.4 oz (83.6 kg)   BMI 27.23 kg/m      Assessment & Plan:  1. Essential hypertension - CMP14+EGFR  2. Hyperlipidemia, unspecified hyperlipidemia type - CMP14+EGFR - Lipid panel  3. Type 2 diabetes mellitus with hyperglycemia, without long-term current use of insulin (HCC) - Bayer DCA Hb A1c Waived - CMP14+EGFR  4. Benign prostatic hyperplasia with lower urinary tract symptoms, symptom details unspecified - CMP14+EGFR  5. Diabetic polyneuropathy associated with type 2 diabetes mellitus ([place]) Will change pt's lyric to gabapentin today for a cheaper option  - CMP14+EGFR - gabapentin (NEURONTIN) 300 MG capsule; Take 1 capsule (300 mg total) by mouth 3 (three) times daily.  Dispense: 90 capsule; Refill: 1  6. Insomnia, unspecified type - CMP14+EGFR  7. Overweight (BMI 25.0-29.9) - CMP14+EGFR  8. Vitamin D deficiency - CMP14+EGFR   Continue all meds Labs pending Health Maintenance reviewed Diet and exercise encouraged RTO 4 months   Evelina Dun, FNP

## 2017-04-30 NOTE — Patient Instructions (Signed)
Diabetic Neuropathy Diabetic neuropathy is a nerve disease or nerve damage that is caused by diabetes mellitus. About half of all people with diabetes mellitus have some form of nerve damage. Nerve damage is more common in those who have had diabetes mellitus for many years and who generally have not had good control of their blood sugar (glucose) level. Diabetic neuropathy is a common complication of diabetes mellitus. There are three common types of diabetic neuropathy and a fourth type that is less common and less understood:  Peripheral neuropathy-This is the most common type of diabetic neuropathy. It causes damage to the nerves of the feet and legs first and then eventually the hands and arms. The damage affects the ability to sense touch.  Autonomic neuropathy-This type causes damage to the autonomic nervous system, which controls the following functions: ? Heartbeat. ? Body temperature. ? Blood pressure. ? Urination. ? Digestion. ? Sweating. ? Sexual function.  Focal neuropathy-Focal neuropathy can be painful and unpredictable and occurs most often in older adults with diabetes mellitus. It involves a specific nerve or one area and often comes on suddenly. It usually does not cause long-term problems.  Radiculoplexus neuropathy- Sometimes called lumbosacral radiculoplexus neuropathy, radiculoplexus neuropathy affects the nerves of the thighs, hips, buttocks, or legs. It is more common in people with type 2 diabetes mellitus and in older men. It is characterized by debilitating pain, weakness, and atrophy, usually in the thigh muscles.  What are the causes? The cause of peripheral, autonomic, and focal neuropathies is diabetes mellitus that is uncontrolled and high glucose levels. The cause of radiculoplexus neuropathy is unknown. However, it is thought to be caused by inflammation related to uncontrolled glucose levels. What are the signs or symptoms? Peripheral Neuropathy Peripheral  neuropathy develops slowly over time. When the nerves of the feet and legs no longer work there may be:  Burning, stabbing, or aching pain in the legs or feet.  Inability to feel pressure or pain in your feet. This can lead to: ? Thick calluses over pressure areas. ? Pressure sores. ? Ulcers.  Foot deformities.  Reduced ability to feel temperature changes.  Muscle weakness.  Autonomic Neuropathy The symptoms of autonomic neuropathy vary depending on which nerves are affected. Symptoms may include:  Problems with digestion, such as: ? Feeling sick to your stomach (nausea). ? Vomiting. ? Bloating. ? Constipation. ? Diarrhea. ? Abdominal pain.  Difficulty with urination. This occurs if you lose your ability to sense when your bladder is full. Problems include: ? Urine leakage (incontinence). ? Inability to empty your bladder completely (retention).  Rapid or irregular heartbeat (palpitations).  Blood pressure drops when you stand up (orthostatic hypotension). When you stand up you may feel: ? Dizzy. ? Weak. ? Faint.  In men, inability to attain and maintain an erection.  In women, vaginal dryness and problems with decreased sexual desire and arousal.  Problems with body temperature regulation.  Increased or decreased sweating.  Focal Neuropathy  Abnormal eye movements or abnormal alignment of both eyes.  Weakness in the wrist.  Foot drop. This results in an inability to lift the foot properly and abnormal walking or foot movement.  Paralysis on one side of your face (Bell palsy).  Chest or abdominal pain. Radiculoplexus Neuropathy  Sudden, severe pain in your hip, thigh, or buttocks.  Weakness and wasting of thigh muscles.  Difficulty rising from a seated position.  Abdominal swelling.  Unexplained weight loss (usually more than 10 lb [4.5 kg]). How is   this diagnosed? Peripheral Neuropathy Your senses may be tested. Sensory function testing can be  done with:  A light touch using a monofilament.  A vibration with tuning fork.  A sharp sensation with a pin prick.  Other tests that can help diagnose neuropathy are:  Nerve conduction velocity. This test checks the transmission of an electrical current through a nerve.  Electromyography. This shows how muscles respond to electrical signals transmitted by nearby nerves.  Quantitative sensory testing. This is used to assess how your nerves respond to vibrations and changes in temperature.  Autonomic Neuropathy Diagnosis is often based on reported symptoms. Tell your health care provider if you experience:  Dizziness.  Constipation.  Diarrhea.  Inappropriate urination or inability to urinate.  Inability to get or maintain an erection.  Tests that may be done include:  Electrocardiography or Holter monitor. These are tests that can help show problems with the heart rate or heart rhythm.  An X-ray exam may be done.  Focal Neuropathy Diagnosis is made based on your symptoms and what your health care provider finds during your exam. Other tests may be done. They may include:  Nerve conduction velocities. This checks the transmission of electrical current through a nerve.  Electromyography. This shows how muscles respond to electrical signals transmitted by nearby nerves.  Quantitative sensory testing. This test is used to assess how your nerves respond to vibration and changes in temperature.  Radiculoplexus Neuropathy  Often the first thing is to eliminate any other issue or problems that might be the cause, as there is no standard test for diagnosis.  X-ray exam of your spine and lumbar region.  Spinal tap to rule out cancer.  MRI to rule out other lesions. How is this treated? Once nerve damage occurs, it cannot be reversed. The goal of treatment is to keep the disease or nerve damage from getting worse and affecting more nerve fibers. Controlling your blood  glucose level is the key. Most people with radiculoplexus neuropathy see at least a partial improvement over time. You will need to keep your blood glucose and HbA1c levels in the target range determined by your health care provider. Things that help control blood glucose levels include:  Blood glucose monitoring.  Meal planning.  Physical activity.  Diabetes medicine.  Over time, maintaining lower blood glucose levels helps lessen symptoms. Sometimes, prescription pain medicine is needed. Follow these instructions at home:  Do not smoke.  Keep your blood glucose level in the range that you and your health care provider have determined acceptable for you.  Keep your blood pressure level in the range that you and your health care provider have determined acceptable for you.  Eat a well-balanced diet.  Be physically active every day. Include strength training and balance exercises.  Protect your feet. ? Check your feet every day for sores, cuts, blisters, or signs of infection. ? Wear padded socks and supportive shoes. Use orthotic inserts, if necessary. ? Regularly check the insides of your shoes for worn spots. Make sure there are no rocks or other items inside your shoes before you put them on. Contact a health care provider if:  You have burning, stabbing, or aching pain in the legs or feet.  You are unable to feel pressure or pain in your feet.  You develop problems with digestion such as: ? Nausea. ? Vomiting. ? Bloating. ? Constipation. ? Diarrhea. ? Abdominal pain.  You have difficulty with urination, such as: ? Incontinence. ? Retention.    You have palpitations.  You develop orthostatic hypotension. When you stand up you may feel: ? Dizzy. ? Weak. ? Faint.  You cannot attain and maintain an erection (in men).  You have vaginal dryness and problems with decreased sexual desire and arousal (in women).  You have severe pain in your thighs, legs, or  buttocks.  You have unexplained weight loss. This information is not intended to replace advice given to you by your health care provider. Make sure you discuss any questions you have with your health care provider. Document Released: 09/22/2001 Document Revised: 12/20/2015 Document Reviewed: 12/23/2012 Elsevier Interactive Patient Education  2017 Elsevier Inc.  

## 2017-05-01 NOTE — Telephone Encounter (Signed)
Why  I dont need to know about this hes in the ER

## 2017-05-01 NOTE — Telephone Encounter (Signed)
Called patient - left message offering appointment.

## 2017-05-01 NOTE — Telephone Encounter (Signed)
OK TO SEE 

## 2017-05-01 NOTE — Telephone Encounter (Signed)
Need to know if we can see patient for this type of finger fracture, per referral by primary care at [facility]?

## 2017-05-02 ENCOUNTER — Other Ambulatory Visit: Payer: Medicare HMO

## 2017-05-02 ENCOUNTER — Other Ambulatory Visit: Payer: Self-pay

## 2017-05-02 DIAGNOSIS — E785 Hyperlipidemia, unspecified: Secondary | ICD-10-CM

## 2017-05-02 DIAGNOSIS — E119 Type 2 diabetes mellitus without complications: Secondary | ICD-10-CM

## 2017-05-02 DIAGNOSIS — E1169 Type 2 diabetes mellitus with other specified complication: Secondary | ICD-10-CM

## 2017-05-03 LAB — LIPID PANEL
CHOL/HDL RATIO: 3.8 ratio (ref 0.0–5.0)
CHOLESTEROL TOTAL: 120 mg/dL (ref 100–199)
HDL: 32 mg/dL — ABNORMAL LOW (ref >39)
LDL CALC: 62 mg/dL (ref 0–99)
TRIGLYCERIDES: 132 mg/dL (ref 0–149)
VLDL CHOLESTEROL CAL: 26 mg/dL (ref 5–40)

## 2017-05-03 LAB — CMP14+EGFR
ALBUMIN: 4.2 g/dL (ref 3.6–4.8)
ALK PHOS: 57 IU/L (ref 39–117)
ALT: 12 IU/L (ref 0–44)
AST: 15 IU/L (ref 0–40)
Albumin/Globulin Ratio: 1.8 (ref 1.2–2.2)
BUN / CREAT RATIO: 8 — AB (ref 10–24)
BUN: 21 mg/dL (ref 8–27)
Bilirubin Total: 0.4 mg/dL (ref 0.0–1.2)
CALCIUM: 9.8 mg/dL (ref 8.6–10.2)
CO2: 22 mmol/L (ref 20–29)
CREATININE: 2.58 mg/dL — AB (ref 0.76–1.27)
Chloride: 101 mmol/L (ref 96–106)
GFR calc Af Amer: 29 mL/min/{1.73_m2} — ABNORMAL LOW (ref >59)
GFR, EST NON AFRICAN AMERICAN: 25 mL/min/{1.73_m2} — AB (ref >59)
GLOBULIN, TOTAL: 2.4 g/dL (ref 1.5–4.5)
Glucose: 148 mg/dL — ABNORMAL HIGH (ref 65–99)
Potassium: 4.7 mmol/L (ref 3.5–5.2)
SODIUM: 140 mmol/L (ref 134–144)
Total Protein: 6.6 g/dL (ref 6.0–8.5)

## 2017-05-04 ENCOUNTER — Other Ambulatory Visit: Payer: Self-pay | Admitting: Family

## 2017-05-04 ENCOUNTER — Other Ambulatory Visit: Payer: Medicare HMO

## 2017-05-04 ENCOUNTER — Telehealth: Payer: Self-pay | Admitting: Family

## 2017-05-04 DIAGNOSIS — R7989 Other specified abnormal findings of blood chemistry: Secondary | ICD-10-CM

## 2017-05-04 DIAGNOSIS — E1165 Type 2 diabetes mellitus with hyperglycemia: Secondary | ICD-10-CM

## 2017-05-04 DIAGNOSIS — E1142 Type 2 diabetes mellitus with diabetic polyneuropathy: Secondary | ICD-10-CM

## 2017-05-04 LAB — BAYER DCA HB A1C WAIVED: HB A1C: 7.2 % — AB (ref <7.0)

## 2017-05-04 NOTE — Telephone Encounter (Signed)
Per wife. Patient is taking 2 metformin in the am and 3 at night. Does patient need to be on metformin or [place]? Please advise

## 2017-05-05 ENCOUNTER — Other Ambulatory Visit: Payer: Self-pay | Admitting: Family

## 2017-05-05 ENCOUNTER — Telehealth: Payer: Self-pay | Admitting: Family

## 2017-05-05 LAB — BMP8+EGFR
BUN/Creatinine Ratio: 13 (ref 10–24)
BUN: 19 mg/dL (ref 8–27)
CALCIUM: 10 mg/dL (ref 8.6–10.2)
CO2: 22 mmol/L (ref 20–29)
CREATININE: 1.45 mg/dL — AB (ref 0.76–1.27)
Chloride: 103 mmol/L (ref 96–106)
GFR, EST AFRICAN AMERICAN: 58 mL/min/{1.73_m2} — AB (ref >59)
GFR, EST NON AFRICAN AMERICAN: 50 mL/min/{1.73_m2} — AB (ref >59)
Glucose: 91 mg/dL (ref 65–99)
POTASSIUM: 4.3 mmol/L (ref 3.5–5.2)
Sodium: 143 mmol/L (ref 134–144)

## 2017-05-05 MED ORDER — GLIMEPIRIDE 2 MG PO TABS: 2.0000 mg | ORAL_TABLET | Freq: Every morning | ORAL | 2 refills | Status: DC

## 2017-05-05 MED ORDER — ACCU-CHEK AVIVA DEVI: 0 refills | Status: AC

## 2017-05-05 NOTE — Telephone Encounter (Signed)
Pt to take Metformin and Glimepiride daily. New Prescription sent to pharmacy.

## 2017-05-05 NOTE — Telephone Encounter (Signed)
Pt aware.

## 2017-05-05 NOTE — Telephone Encounter (Signed)
Pt needs to continue metformin  in AM and 1500 mg in PM. Is he also still taking Glimepiride 2 mg daily?

## 2017-05-05 NOTE — Addendum Note (Signed)
Addended by: Jannifer Rodney A on: 05/05/2017 09:44 AM   Modules accepted: Orders

## 2017-05-05 NOTE — Telephone Encounter (Signed)
We will continue metformin and glimepiride. Low carb diet and exercise

## 2017-05-05 NOTE — Telephone Encounter (Signed)
Pt aware by detailed VM 

## 2017-05-05 NOTE — Telephone Encounter (Signed)
Per pt's wife pt HAS been taking Glimeperide  daily along with the Metformin Do you want to increase Glimeperide?

## 2017-05-05 NOTE — Addendum Note (Signed)
Addended by: Magdalene River on: 05/05/2017 04:36 PM   Modules accepted: Orders

## 2017-05-05 NOTE — Telephone Encounter (Signed)
No pt is only taking Metformin Please advise

## 2017-05-13 NOTE — Telephone Encounter (Signed)
See additional follow up calls in Referral Workqueue. As of 05/12/17 - Patient elects to not schedule, as states doing well. Referring primary care office notified.

## 2017-05-26 ENCOUNTER — Ambulatory Visit (INDEPENDENT_AMBULATORY_CARE_PROVIDER_SITE_OTHER): Payer: Medicare HMO

## 2017-05-26 DIAGNOSIS — Z23 Encounter for immunization: Secondary | ICD-10-CM

## 2017-06-29 ENCOUNTER — Other Ambulatory Visit: Payer: Self-pay | Admitting: Family

## 2017-06-29 DIAGNOSIS — E785 Hyperlipidemia, unspecified: Secondary | ICD-10-CM

## 2017-07-03 ENCOUNTER — Encounter: Payer: Self-pay | Admitting: *Deleted

## 2017-07-08 ENCOUNTER — Ambulatory Visit: Payer: Medicare HMO | Admitting: Pediatrics

## 2017-07-09 ENCOUNTER — Encounter: Payer: Self-pay | Admitting: Family

## 2017-07-09 ENCOUNTER — Ambulatory Visit: Payer: Medicare HMO | Admitting: Family

## 2017-07-09 VITALS — BP 137/77 | HR 82 | Temp 97.1°F | Ht 69.0 in | Wt 193.2 lb

## 2017-07-09 DIAGNOSIS — J209 Acute bronchitis, unspecified: Secondary | ICD-10-CM | POA: Diagnosis not present

## 2017-07-09 MED ORDER — BENZONATATE 200 MG PO CAPS: 200.0000 mg | ORAL_CAPSULE | Freq: Three times a day (TID) | ORAL | 1 refills | Status: DC | PRN

## 2017-07-09 MED ORDER — HYDROCODONE-HOMATROPINE 5-1.5 MG/5ML PO SYRP: 5.0000 mL | ORAL_SOLUTION | Freq: Three times a day (TID) | ORAL | 0 refills | Status: DC | PRN

## 2017-07-09 NOTE — Progress Notes (Signed)
   Subjective:    Patient ID: Barry Smith., male    DOB: [birth year], 66 y.o.   MRN: [id number]  Cough  This is a new problem. The current episode started in the past 7 days. The problem has been waxing and waning. The problem occurs every few minutes. The cough is productive of sputum. Pertinent negatives include no chills, ear congestion, ear pain, fever, myalgias, rhinorrhea, sore throat, shortness of breath, weight loss or wheezing. He has tried rest and OTC cough suppressant for the symptoms. The treatment provided mild relief.      Review of Systems  Constitutional: Negative for chills, fever and weight loss.  HENT: Negative for ear pain, rhinorrhea and sore throat.   Respiratory: Positive for cough. Negative for shortness of breath and wheezing.   Musculoskeletal: Negative for myalgias.  All other systems reviewed and are negative.      Objective:   Physical Exam  Constitutional: He is oriented to person, place, and time. He appears well-developed and well-nourished. No distress.  HENT:  Head: Normocephalic.  Right Ear: External ear normal.  Left Ear: External ear normal.  Nose: Mucosal edema and rhinorrhea present.  Mouth/Throat: Posterior oropharyngeal erythema present.  Eyes: Pupils are equal, round, and reactive to light. Right eye exhibits no discharge. Left eye exhibits no discharge.  Neck: Normal range of motion. Neck supple. No thyromegaly present.  Cardiovascular: Normal rate, regular rhythm, normal heart sounds and intact distal pulses.  No murmur heard. Pulmonary/Chest: Effort normal and breath sounds normal. No respiratory distress. He has no wheezes.  Intermittent dry cough   Abdominal: Soft. Bowel sounds are normal. He exhibits no distension. There is no tenderness.  Musculoskeletal: Normal range of motion. He exhibits no edema or tenderness.  Neurological: He is alert and oriented to person, place, and time.  Skin: Skin is warm and dry. No rash  noted. No erythema.  Psychiatric: He has a normal mood and affect. His behavior is normal. Judgment and thought content normal.  Vitals reviewed.     BP 137/77   Pulse 82   Temp (!) 97.1 F (36.2 C)   Ht 5\' 9"  (1.753 m)   Wt 193 lb 3.2 oz (87.6 kg)   BMI 28.53 kg/m      Assessment & Plan:  1. Acute bronchitis, unspecified organism - Take meds as prescribed - Use a cool mist humidifier  -Force fluids -For any cough or congestion  Use plain Mucinex- regular strength or max strength is fine -For fever or aces or pains- take tylenol or ibuprofen appropriate for age and weight. -Throat lozenges if help - HYDROcodone-homatropine (HYCODAN) 5-1.5 MG/5ML syrup; Take 5 mLs by mouth every 8 (eight) hours as needed for cough.  Dispense: 120 mL; Refill: 0 - benzonatate (TESSALON) 200 MG capsule; Take 1 capsule (200 mg total) by mouth 3 (three) times daily as needed.  Dispense: 30 capsule; Refill: 1    Jannifer Rodneyhristy Roy Tokarz, FNP

## 2017-07-09 NOTE — Patient Instructions (Signed)

## 2017-07-24 ENCOUNTER — Other Ambulatory Visit: Payer: Self-pay | Admitting: Family

## 2017-07-24 DIAGNOSIS — I10 Essential (primary) hypertension: Secondary | ICD-10-CM

## 2017-08-05 ENCOUNTER — Encounter: Payer: Self-pay | Admitting: Family

## 2017-08-05 ENCOUNTER — Ambulatory Visit (INDEPENDENT_AMBULATORY_CARE_PROVIDER_SITE_OTHER): Payer: Medicare HMO | Admitting: Family

## 2017-08-05 ENCOUNTER — Other Ambulatory Visit: Payer: Self-pay | Admitting: Family

## 2017-08-05 VITALS — BP 137/88 | HR 79 | Temp 97.2°F | Ht 69.0 in | Wt 190.8 lb

## 2017-08-05 DIAGNOSIS — E663 Overweight: Secondary | ICD-10-CM

## 2017-08-05 DIAGNOSIS — E1169 Type 2 diabetes mellitus with other specified complication: Secondary | ICD-10-CM | POA: Diagnosis not present

## 2017-08-05 DIAGNOSIS — G4700 Insomnia, unspecified: Secondary | ICD-10-CM

## 2017-08-05 DIAGNOSIS — E1165 Type 2 diabetes mellitus with hyperglycemia: Secondary | ICD-10-CM | POA: Diagnosis not present

## 2017-08-05 DIAGNOSIS — E1159 Type 2 diabetes mellitus with other circulatory complications: Secondary | ICD-10-CM | POA: Diagnosis not present

## 2017-08-05 DIAGNOSIS — E1142 Type 2 diabetes mellitus with diabetic polyneuropathy: Secondary | ICD-10-CM

## 2017-08-05 DIAGNOSIS — E559 Vitamin D deficiency, unspecified: Secondary | ICD-10-CM

## 2017-08-05 DIAGNOSIS — E785 Hyperlipidemia, unspecified: Secondary | ICD-10-CM | POA: Diagnosis not present

## 2017-08-05 DIAGNOSIS — N401 Enlarged prostate with lower urinary tract symptoms: Secondary | ICD-10-CM | POA: Diagnosis not present

## 2017-08-05 DIAGNOSIS — I152 Hypertension secondary to endocrine disorders: Secondary | ICD-10-CM

## 2017-08-05 DIAGNOSIS — I10 Essential (primary) hypertension: Secondary | ICD-10-CM

## 2017-08-05 DIAGNOSIS — J209 Acute bronchitis, unspecified: Secondary | ICD-10-CM

## 2017-08-05 LAB — BAYER DCA HB A1C WAIVED: HB A1C (BAYER DCA - WAIVED): 7.5 % — ABNORMAL HIGH (ref <7.0)

## 2017-08-05 NOTE — Patient Instructions (Signed)
Diabetes Mellitus and Nutrition When you have diabetes (diabetes mellitus), it is very important to have healthy eating habits because your blood sugar (glucose) levels are greatly affected by what you eat and drink. Eating healthy foods in the appropriate amounts, at about the same times every day, can help you:  Control your blood glucose.  Lower your risk of heart disease.  Improve your blood pressure.  Reach or maintain a healthy weight.  Every person with diabetes is different, and each person has different needs for a meal plan. Your health care provider may recommend that you work with a diet and nutrition specialist (dietitian) to make a meal plan that is best for you. Your meal plan may vary depending on factors such as:  The calories you need.  The medicines you take.  Your weight.  Your blood glucose, blood pressure, and cholesterol levels.  Your activity level.  Other health conditions you have, such as heart or kidney disease.  How do carbohydrates affect me? Carbohydrates affect your blood glucose level more than any other type of food. Eating carbohydrates naturally increases the amount of glucose in your blood. Carbohydrate counting is a method for keeping track of how many carbohydrates you eat. Counting carbohydrates is important to keep your blood glucose at a healthy level, especially if you use insulin or take certain oral diabetes medicines. It is important to know how many carbohydrates you can safely have in each meal. This is different for every person. Your dietitian can help you calculate how many carbohydrates you should have at each meal and for snack. Foods that contain carbohydrates include:  Bread, cereal, rice, pasta, and crackers.  Potatoes and corn.  Peas, beans, and lentils.  Milk and yogurt.  Fruit and juice.  Desserts, such as cakes, cookies, ice cream, and candy.  How does alcohol affect me? Alcohol can cause a sudden decrease in blood  glucose (hypoglycemia), especially if you use insulin or take certain oral diabetes medicines. Hypoglycemia can be a life-threatening condition. Symptoms of hypoglycemia (sleepiness, dizziness, and confusion) are similar to symptoms of having too much alcohol. If your health care provider says that alcohol is safe for you, follow these guidelines:  Limit alcohol intake to no more than 1 drink per day for nonpregnant women and 2 drinks per day for men. One drink equals 12 oz of beer, 5 oz of wine, or 1 oz of hard liquor.  Do not drink on an empty stomach.  Keep yourself hydrated with water, diet soda, or unsweetened iced tea.  Keep in mind that regular soda, juice, and other mixers may contain a lot of sugar and must be counted as carbohydrates.  What are tips for following this plan? Reading food labels  Start by checking the serving size on the label. The amount of calories, carbohydrates, fats, and other nutrients listed on the label are based on one serving of the food. Many foods contain more than one serving per package.  Check the total grams (g) of carbohydrates in one serving. You can calculate the number of servings of carbohydrates in one serving by dividing the total carbohydrates by 15. For example, if a food has 30 g of total carbohydrates, it would be equal to 2 servings of carbohydrates.  Check the number of grams (g) of saturated and trans fats in one serving. Choose foods that have low or no amount of these fats.  Check the number of milligrams (mg) of sodium in one serving. Most people   should limit total sodium intake to less than 2,300 mg per day.  Always check the nutrition information of foods labeled as "low-fat" or "nonfat". These foods may be higher in added sugar or refined carbohydrates and should be avoided.  Talk to your dietitian to identify your daily goals for nutrients listed on the label. Shopping  Avoid buying canned, premade, or processed foods. These  foods tend to be high in fat, sodium, and added sugar.  Shop around the outside edge of the grocery store. This includes fresh fruits and vegetables, bulk grains, fresh meats, and fresh dairy. Cooking  Use low-heat cooking methods, such as baking, instead of high-heat cooking methods like deep frying.  Cook using healthy oils, such as olive, canola, or sunflower oil.  Avoid cooking with butter, cream, or high-fat meats. Meal planning  Eat meals and snacks regularly, preferably at the same times every day. Avoid going long periods of time without eating.  Eat foods high in fiber, such as fresh fruits, vegetables, beans, and whole grains. Talk to your dietitian about how many servings of carbohydrates you can eat at each meal.  Eat 4-6 ounces of lean protein each day, such as lean meat, chicken, fish, eggs, or tofu. 1 ounce is equal to 1 ounce of meat, chicken, or fish, 1 egg, or 1/4 cup of tofu.  Eat some foods each day that contain healthy fats, such as avocado, nuts, seeds, and fish. Lifestyle   Check your blood glucose regularly.  Exercise at least 30 minutes 5 or more days each week, or as told by your health care provider.  Take medicines as told by your health care provider.  Do not use any products that contain nicotine or tobacco, such as cigarettes and e-cigarettes. If you need help quitting, ask your health care provider.  Work with a counselor or diabetes educator to identify strategies to manage stress and any emotional and social challenges. What are some questions to ask my health care provider?  Do I need to meet with a diabetes educator?  Do I need to meet with a dietitian?  What number can I call if I have questions?  When are the best times to check my blood glucose? Where to find more information:  American Diabetes Association: diabetes.org/food-and-fitness/food  Academy of Nutrition and Dietetics:  www.eatright.org/resources/health/diseases-and-conditions/diabetes  National Institute of Diabetes and Digestive and Kidney Diseases (NIH): www.niddk.nih.gov/health-information/diabetes/overview/diet-eating-physical-activity Summary  A healthy meal plan will help you control your blood glucose and maintain a healthy lifestyle.  Working with a diet and nutrition specialist (dietitian) can help you make a meal plan that is best for you.  Keep in mind that carbohydrates and alcohol have immediate effects on your blood glucose levels. It is important to count carbohydrates and to use alcohol carefully. This information is not intended to replace advice given to you by your health care provider. Make sure you discuss any questions you have with your health care provider. Document Released: 04/10/2005 Document Revised: 08/18/2016 Document Reviewed: 08/18/2016 Elsevier Interactive Patient Education  2018 Elsevier Inc.  

## 2017-08-05 NOTE — Progress Notes (Signed)
Subjective:    Patient ID: Barry Smith., male    DOB: [birth year], 67 y.o.   MRN: [id number]  PT presents to the office today for chronic follow up.  Diabetes  He presents for his follow-up diabetic visit. He has type 2 diabetes mellitus. His disease course has been stable. There are no hypoglycemic associated symptoms. Pertinent negatives for diabetes include no blurred vision, no foot paresthesias and no foot ulcerations. There are no hypoglycemic complications. Pertinent negatives for hypoglycemia complications include no blackouts and no hospitalization. Symptoms are stable. There are no diabetic complications. Pertinent negatives for diabetic complications include no CVA or heart disease. Risk factors for coronary artery disease include diabetes mellitus, dyslipidemia, male sex, hypertension and sedentary lifestyle. He is following a generally healthy diet. His breakfast blood glucose range is generally 110-130 mg/dl. Eye exam is current.  Hyperlipidemia  This is a chronic problem. The current episode started more than 1 year ago. The problem is uncontrolled. He has no history of obesity. Current antihyperlipidemic treatment includes statins. The current treatment provides moderate improvement of lipids.  Insomnia  Primary symptoms: difficulty falling asleep.  The current episode started more than one year. The onset quality is gradual.  Benign Prostatic Hypertrophy  This is a chronic problem. The current episode started more than 1 year ago. The problem has been resolved since onset. Irritative symptoms do not include frequency or nocturia.  Diabetic Neuropathy Pt currently taking Lyric 100 mg daily. PT states his pain in his feet is a 0 which is improved of  9 out 10.    Review of Systems  Eyes: Negative for blurred vision.  Genitourinary: Negative for frequency and nocturia.  Psychiatric/Behavioral: The patient has insomnia.   All other systems reviewed and are  negative.      Objective:   Physical Exam  Constitutional: He is oriented to person, place, and time. He appears well-developed and well-nourished. No distress.  HENT:  Head: Normocephalic.  Right Ear: External ear normal.  Left Ear: External ear normal.  Nose: Nose normal.  Mouth/Throat: Oropharynx is clear and moist.  Eyes: Pupils are equal, round, and reactive to light. Right eye exhibits no discharge. Left eye exhibits no discharge.  Neck: Normal range of motion. Neck supple. No thyromegaly present.  Cardiovascular: Normal rate, regular rhythm, normal heart sounds and intact distal pulses.  No murmur heard. Pulmonary/Chest: Effort normal and breath sounds normal. No respiratory distress. He has no wheezes.  Abdominal: Soft. Bowel sounds are normal. He exhibits no distension. There is no tenderness.  Musculoskeletal: Normal range of motion. He exhibits no edema or tenderness.  Neurological: He is alert and oriented to person, place, and time.  Skin: Skin is warm and dry. No rash noted. No erythema.  Psychiatric: He has a normal mood and affect. His behavior is normal. Judgment and thought content normal.  Vitals reviewed.    BP 137/88   Pulse 79   Temp (!) 97.2 F (36.2 C) (Oral)   Ht _0  (1.753 m)   Wt 190 lb 12.8 oz (86.5 kg)   BMI 28.18 kg/m      Assessment & Plan:  1. Type 2 diabetes mellitus with hyperglycemia, without long-term current use of insulin (HCC) - Bayer DCA Hb A1c Waived - CMP14+EGFR  2. Hypertension associated with diabetes ([place]) - CMP14+EGFR  3. Diabetic polyneuropathy associated with type 2 diabetes mellitus ([place]) - CMP14+EGFR  4. Hyperlipidemia associated with type 2 diabetes mellitus (HCC) - Lipid  panel - CMP14+EGFR  5. Overweight (BMI 25.0-29.9) - CMP14+EGFR  6. Insomnia, unspecified type - CMP14+EGFR  7. Vitamin D deficiency - CMP14+EGFR - VITAMIN D 25 Hydroxy (Vit-D Deficiency, Fractures)  8. Benign prostatic hyperplasia with  lower urinary tract symptoms, symptom details unspecified - CMP14+EGFR   Continue all meds Labs pending Health Maintenance reviewed Diet and exercise encouraged RTO 6 months   Evelina Dun, FNP

## 2017-08-06 LAB — LIPID PANEL
CHOLESTEROL TOTAL: 132 mg/dL (ref 100–199)
Chol/HDL Ratio: 3.5 ratio (ref 0.0–5.0)
HDL: 38 mg/dL — AB (ref >39)
LDL Calculated: 74 mg/dL (ref 0–99)
TRIGLYCERIDES: 98 mg/dL (ref 0–149)
VLDL Cholesterol Cal: 20 mg/dL (ref 5–40)

## 2017-08-06 LAB — CMP14+EGFR
A/G RATIO: 2 (ref 1.2–2.2)
ALBUMIN: 4.7 g/dL (ref 3.6–4.8)
ALT: 24 IU/L (ref 0–44)
AST: 17 IU/L (ref 0–40)
Alkaline Phosphatase: 56 IU/L (ref 39–117)
BILIRUBIN TOTAL: 0.4 mg/dL (ref 0.0–1.2)
BUN / CREAT RATIO: 14 (ref 10–24)
BUN: 11 mg/dL (ref 8–27)
CALCIUM: 9.8 mg/dL (ref 8.6–10.2)
CO2: 24 mmol/L (ref 20–29)
Chloride: 104 mmol/L (ref 96–106)
Creatinine, Ser: 0.77 mg/dL (ref 0.76–1.27)
GFR, EST AFRICAN AMERICAN: 109 mL/min/{1.73_m2} (ref >59)
GFR, EST NON AFRICAN AMERICAN: 95 mL/min/{1.73_m2} (ref >59)
GLOBULIN, TOTAL: 2.4 g/dL (ref 1.5–4.5)
Glucose: 159 mg/dL — ABNORMAL HIGH (ref 65–99)
POTASSIUM: 4.6 mmol/L (ref 3.5–5.2)
Sodium: 144 mmol/L (ref 134–144)
Total Protein: 7.1 g/dL (ref 6.0–8.5)

## 2017-08-06 LAB — VITAMIN D 25 HYDROXY (VIT D DEFICIENCY, FRACTURES): Vit D, 25-Hydroxy: 56.8 ng/mL (ref 30.0–100.0)

## 2017-08-07 ENCOUNTER — Ambulatory Visit: Payer: Medicare HMO | Admitting: Family

## 2017-08-19 ENCOUNTER — Other Ambulatory Visit: Payer: Self-pay | Admitting: Family

## 2017-08-19 DIAGNOSIS — E1165 Type 2 diabetes mellitus with hyperglycemia: Secondary | ICD-10-CM

## 2017-08-19 DIAGNOSIS — E1142 Type 2 diabetes mellitus with diabetic polyneuropathy: Secondary | ICD-10-CM

## 2017-08-24 ENCOUNTER — Other Ambulatory Visit: Payer: Self-pay | Admitting: Family

## 2017-08-24 DIAGNOSIS — J209 Acute bronchitis, unspecified: Secondary | ICD-10-CM

## 2017-08-24 DIAGNOSIS — N401 Enlarged prostate with lower urinary tract symptoms: Secondary | ICD-10-CM

## 2017-08-24 NOTE — Telephone Encounter (Signed)
Last seen 08/05/17  Christy 

## 2017-08-27 ENCOUNTER — Other Ambulatory Visit: Payer: Self-pay | Admitting: Family

## 2017-08-27 ENCOUNTER — Other Ambulatory Visit: Payer: Self-pay | Admitting: Family Medicine

## 2017-09-03 ENCOUNTER — Telehealth: Payer: Self-pay | Admitting: *Deleted

## 2017-09-03 MED ORDER — FREESTYLE LIBRE READER DEVI: 1.0000 | Freq: Three times a day (TID) | 0 refills | Status: DC

## 2017-09-03 MED ORDER — FREESTYLE LIBRE SENSOR SYSTEM MISC: 1.0000 | Freq: Three times a day (TID) | 0 refills | Status: DC

## 2017-09-03 NOTE — Telephone Encounter (Signed)
Pt requesting RX for CGM Freestyle Libre Please review and advise

## 2017-09-03 NOTE — Telephone Encounter (Signed)
Prescription sent to pharmacy.

## 2017-09-23 ENCOUNTER — Ambulatory Visit (INDEPENDENT_AMBULATORY_CARE_PROVIDER_SITE_OTHER): Payer: Medicare HMO | Admitting: Family Medicine

## 2017-09-23 ENCOUNTER — Encounter: Payer: Self-pay | Admitting: Family Medicine

## 2017-09-23 ENCOUNTER — Ambulatory Visit (INDEPENDENT_AMBULATORY_CARE_PROVIDER_SITE_OTHER): Payer: Medicare HMO

## 2017-09-23 VITALS — BP 101/52 | HR 91 | Temp 97.8°F | Ht 69.0 in | Wt 186.4 lb

## 2017-09-23 DIAGNOSIS — R059 Cough, unspecified: Secondary | ICD-10-CM

## 2017-09-23 DIAGNOSIS — R05 Cough: Secondary | ICD-10-CM

## 2017-09-23 DIAGNOSIS — J329 Chronic sinusitis, unspecified: Secondary | ICD-10-CM

## 2017-09-23 DIAGNOSIS — J40 Bronchitis, not specified as acute or chronic: Secondary | ICD-10-CM

## 2017-09-23 LAB — VERITOR FLU A/B WAIVED
Influenza A: NEGATIVE
Influenza B: NEGATIVE

## 2017-09-23 MED ORDER — AMOXICILLIN-POT CLAVULANATE 875-125 MG PO TABS: 1.0000 | ORAL_TABLET | Freq: Two times a day (BID) | ORAL | 0 refills | Status: DC

## 2017-09-23 MED ORDER — HYDROCODONE-HOMATROPINE 5-1.5 MG/5ML PO SYRP: 5.0000 mL | ORAL_SOLUTION | Freq: Four times a day (QID) | ORAL | 0 refills | Status: DC | PRN

## 2017-09-23 NOTE — Progress Notes (Signed)
Chief Complaint  Patient presents with  . Cough    pt here today c/o cough and headache since Thursday of last week    HPI  Patient presents today for patient presents with dry cough runny stuffy nose. Diffuse headache of moderate intensity. Patient also has chills and subjective fever. Body aches worst in the back but present in the legs, shoulders, and torso as well. Has sapped the energy to the point that of being unable to perform usual activities other than ADLs. Onset 5 days ago.   PMH: Smoking status noted ROS: Per HPI  Objective: BP (!) 101/52   Pulse 91   Temp 97.8 F (36.6 C) (Oral)   Ht 5\' 9"  (1.753 m)   Wt 186 lb 6 oz (84.5 kg)   BMI 27.52 kg/m  Gen: NAD, alert, cooperative with exam HEENT: NCAT, EOMI, PERRL CV: RRR, good S1/S2, no murmur Resp: CTABL, no wheezes, non-labored Ext: No edema, warm Neuro: Alert and oriented, No gross deficits  Assessment and plan:  1. Sinobronchitis   2. Cough     Meds ordered this encounter  Medications  . amoxicillin-clavulanate (AUGMENTIN) 875-125 MG tablet    Sig: Take 1 tablet by mouth 2 (two) times daily. Take all of this medication    Dispense:  20 tablet    Refill:  0  . HYDROcodone-homatropine (HYCODAN) 5-1.5 MG/5ML syrup    Sig: Take 5 mLs by mouth every 6 (six) hours as needed for cough.    Dispense:  120 mL    Refill:  0    Orders Placed This Encounter  Procedures  . DG Chest 2 View    Standing Status:   Future    Number of Occurrences:   1    Standing Expiration Date:   11/23/2018    Order Specific Question:   Reason for Exam (SYMPTOM  OR DIAGNOSIS REQUIRED)    Answer:   cough    Order Specific Question:   Preferred imaging location?    Answer:   Internal  . Veritor Flu A/B Waived    Order Specific Question:   Source    Answer:   nasal    Follow up as needed.  Mechele ClaudeWarren Roshan Roback, MD

## 2017-09-24 ENCOUNTER — Other Ambulatory Visit: Payer: Self-pay

## 2017-09-24 ENCOUNTER — Telehealth: Payer: Self-pay | Admitting: Family

## 2017-09-24 ENCOUNTER — Encounter (HOSPITAL_COMMUNITY): Payer: Self-pay | Admitting: *Deleted

## 2017-09-24 ENCOUNTER — Emergency Department (HOSPITAL_COMMUNITY)
Admission: EM | Admit: 2017-09-24 | Discharge: 2017-09-25 | Disposition: A | Discharge status: Home / Self Care | Payer: Medicare HMO | Attending: Emergency Medicine | Admitting: Emergency Medicine

## 2017-09-24 DIAGNOSIS — Z7984 Long term (current) use of oral hypoglycemic drugs: Secondary | ICD-10-CM | POA: Diagnosis not present

## 2017-09-24 DIAGNOSIS — Z79899 Other long term (current) drug therapy: Secondary | ICD-10-CM | POA: Insufficient documentation

## 2017-09-24 DIAGNOSIS — J209 Acute bronchitis, unspecified: Secondary | ICD-10-CM

## 2017-09-24 DIAGNOSIS — I10 Essential (primary) hypertension: Secondary | ICD-10-CM | POA: Diagnosis not present

## 2017-09-24 DIAGNOSIS — R05 Cough: Secondary | ICD-10-CM | POA: Diagnosis present

## 2017-09-24 DIAGNOSIS — E1140 Type 2 diabetes mellitus with diabetic neuropathy, unspecified: Secondary | ICD-10-CM | POA: Insufficient documentation

## 2017-09-24 DIAGNOSIS — Z7982 Long term (current) use of aspirin: Secondary | ICD-10-CM | POA: Diagnosis not present

## 2017-09-24 LAB — CBC WITH DIFFERENTIAL/PLATELET
BASOS PCT: 0 %
Basophils Absolute: 0 10*3/uL (ref 0.0–0.1)
EOS ABS: 0.1 10*3/uL (ref 0.0–0.7)
Eosinophils Relative: 1 %
HEMATOCRIT: 38.7 % — AB (ref 39.0–52.0)
HEMOGLOBIN: 12.7 g/dL — AB (ref 13.0–17.0)
Lymphocytes Relative: 23 %
Lymphs Abs: 1.4 10*3/uL (ref 0.7–4.0)
MCH: 27.8 pg (ref 26.0–34.0)
MCHC: 32.8 g/dL (ref 30.0–36.0)
MCV: 84.7 fL (ref 78.0–100.0)
MONO ABS: 0.7 10*3/uL (ref 0.1–1.0)
MONOS PCT: 11 %
NEUTROS ABS: 3.9 10*3/uL (ref 1.7–7.7)
NEUTROS PCT: 65 %
Platelets: 257 10*3/uL (ref 150–400)
RBC: 4.57 MIL/uL (ref 4.22–5.81)
RDW: 13.6 % (ref 11.5–15.5)
WBC: 6.1 10*3/uL (ref 4.0–10.5)

## 2017-09-24 LAB — COMPREHENSIVE METABOLIC PANEL
ALBUMIN: 4 g/dL (ref 3.5–5.0)
ALK PHOS: 52 U/L (ref 38–126)
ALT: 28 U/L (ref 17–63)
ANION GAP: 14 (ref 5–15)
AST: 44 U/L — ABNORMAL HIGH (ref 15–41)
BILIRUBIN TOTAL: 0.5 mg/dL (ref 0.3–1.2)
BUN: 11 mg/dL (ref 6–20)
CALCIUM: 9.1 mg/dL (ref 8.9–10.3)
CO2: 23 mmol/L (ref 22–32)
Chloride: 99 mmol/L — ABNORMAL LOW (ref 101–111)
Creatinine, Ser: 1.02 mg/dL (ref 0.61–1.24)
GFR calc non Af Amer: >60 mL/min (ref >60)
Glucose, Bld: 121 mg/dL — ABNORMAL HIGH (ref 65–99)
POTASSIUM: 3.8 mmol/L (ref 3.5–5.1)
Sodium: 136 mmol/L (ref 135–145)
TOTAL PROTEIN: 7.2 g/dL (ref 6.5–8.1)

## 2017-09-24 MED ORDER — PREDNISONE 10 MG PO TABS
60.0000 mg | ORAL_TABLET | Freq: Once | ORAL | Status: AC
  Administered 2017-09-24: 22:00:00 60 mg via ORAL
  Filled 2017-09-24: qty 1

## 2017-09-24 MED ORDER — IPRATROPIUM-ALBUTEROL 0.5-2.5 (3) MG/3ML IN SOLN
3.0000 mL | Freq: Once | RESPIRATORY_TRACT | Status: AC
  Administered 2017-09-24: 3 mL via RESPIRATORY_TRACT
  Filled 2017-09-24: qty 3

## 2017-09-24 NOTE — Telephone Encounter (Signed)
Aware. He will not take any more of the  antibiotic medication.   Advised to go to ED if any symptoms begon again.

## 2017-09-24 NOTE — ED Provider Notes (Signed)
[facility] EMERGENCY DEPARTMENT Provider Note   CSN: [id number] Arrival date & time: 09/24/17  1918     History   Chief Complaint Chief Complaint  Patient presents with  . Cough    HPI Barry Eddinger Sr. is a 67 y.o. male.  Patient's been sick since Monday.  Seen by Tommi Rumps rocking him family practice yesterday.  A chest x-ray done that was negative for pneumonia.  Also had a flu test done that was negative for influenza.  Patient's had cough and congestion.  Sometimes productive.  No nausea vomiting or diarrhea.  Patient has history is significant for diabetes.  Patient was prescribed Augmentin and and a codeine-based cough medicine.  Patient does not use albuterol or inhalers at home.      Past Medical History:  Diagnosis Date  . Anxiety   . Diabetes mellitus without complication ([place])   . Hyperlipidemia   . Hypertension     Patient Active Problem List   Diagnosis Date Noted  . Insomnia 08/07/2016  . Diabetic neuropathy ([place]) 08/07/2016  . Overweight (BMI 25.0-29.9) 03/14/2016  . BPH (benign prostatic hyperplasia) 11/20/2015  . Vitamin D deficiency 02/02/2014  . DM (diabetes mellitus) ([place]) 02/11/2013  . Hypertension associated with diabetes ([place]) 02/11/2013  . Hyperlipidemia associated with type 2 diabetes mellitus ([place]) 02/11/2013    Past Surgical History:  Procedure Laterality Date  . APPENDECTOMY         Home Medications    Prior to Admission medications   Medication Sig Start Date End Date Taking? Authorizing Provider  amoxicillin-clavulanate (AUGMENTIN) 875-125 MG tablet Take 1 tablet by mouth 2 (two) times daily. Take all of this medication 09/23/17  Yes Stacks, Cletus Gash, MD  aspirin 81 MG tablet Take 81 mg by mouth daily.   Yes [provider]  cholecalciferol (VITAMIN D) 1000 UNITS tablet Take 1 tablet (1,000 Units total) by mouth daily. 08/10/14  Yes Hawks, Alyse Low A, FNP  glimepiride (AMARYL) 2 MG tablet TAKE ONE TABLET EVERY MORNING  08/20/17  Yes Hawks, Christy A, FNP  HYDROcodone-homatropine (HYCODAN) 5-1.5 MG/5ML syrup Take 5 mLs by mouth every 6 (six) hours as needed for cough. 09/23/17  Yes Stacks, Cletus Gash, MD  losartan (COZAAR) 100 MG tablet TAKE ONE (1) TABLET EACH DAY 07/24/17  Yes Hawks, Christy A, FNP  LYRICA 100 MG capsule Take 100 mg by mouth 3 (three) times daily.  04/06/17  Yes [provider]  metFORMIN (GLUCOPHAGE) 500 MG tablet TAKE 2 TABLETS EVERY MORNING AND TAKE 3 TABLETS AT BEDTIME 08/05/17  Yes Hawks, Christy A, FNP  Multiple Vitamin (MULTIVITAMIN) capsule Take 1 capsule by mouth daily.   Yes [provider]  Omega-3 Fatty Acids (FISH OIL) 1200 MG CAPS Take 2 capsules by mouth daily.    Yes [provider]  simvastatin (ZOCOR) 20 MG tablet TAKE ONE TABLET DAILY AT BEDTIME 06/29/17  Yes Hawks, Alyse Low A, FNP  tamsulosin (FLOMAX) 0.4 MG CAPS capsule TAKE ONE (1) CAPSULE EACH DAY 08/24/17  Yes Hawks, [place] A, FNP  ACCU-CHEK AVIVA PLUS test strip TEST BLOOD SUGAR FOUR TIMES DAILY FOR DIABETES 08/27/17   Sharion Balloon, FNP  ACCU-CHEK SOFTCLIX LANCETS lancets TEST BLOOD SUGAR FOUR TIMES DAILY 08/27/17   Evelina Dun A, FNP  blood glucose meter kit and supplies Use up to four times daily. DX E11.65 12/19/16   Timmothy Euler, MD  Blood Glucose Monitoring Suppl (ACCU-CHEK AVIVA) device Use as instructed 05/05/17 05/05/18  Timmothy Euler, MD  Continuous Blood Gluc Receiver (FREESTYLE LIBRE READER) DEVI 1 Device by Does not apply route 4 (four) times daily -  before meals and at bedtime. 09/03/17   Sharion Balloon, FNP  Continuous Blood Gluc Sensor (FREESTYLE LIBRE SENSOR SYSTEM) MISC 1 Device by Does not apply route 4 (four) times daily - after meals and at bedtime. 09/03/17   Evelina Dun A, FNP  dextromethorphan-guaiFENesin (MUCINEX DM) 30-600 MG 12hr tablet Take 1 tablet by mouth 2 (two) times daily. 09/25/17   Fredia Sorrow, MD  predniSONE (DELTASONE) 10 MG tablet Take 4 tablets (40 mg  total) by mouth daily. 09/25/17   Fredia Sorrow, MD    Family History Family History  Problem Relation Age of Onset  . Diabetes Father   . Diabetes Sister   . Pneumonia Brother   . Diabetes Sister   . Diabetes Sister   . Diabetes Sister   . Diabetes Sister   . Diabetes Sister   . Cancer Sister   . Diabetes Brother   . Diabetes Brother   . Diabetes Brother   . Diabetes Brother   . Diabetes Brother     Social History Social History   Tobacco Use  . Smoking status: Never Smoker  . Smokeless tobacco: Never Used  Substance Use Topics  . Alcohol use: No  . Drug use: No     Allergies   Patient has no known allergies.   Review of Systems Review of Systems  Constitutional: Positive for fatigue. Negative for fever.  HENT: Positive for congestion.   Eyes: Negative for redness.  Respiratory: Positive for cough and shortness of breath.   Cardiovascular: Negative for chest pain.  Gastrointestinal: Negative for abdominal pain, diarrhea, nausea and vomiting.  Genitourinary: Negative for hematuria.  Musculoskeletal: Negative for myalgias.  Skin: Negative for rash.  Neurological: Negative for headaches.  Hematological: Does not bruise/bleed easily.  Psychiatric/Behavioral: Negative for confusion.     Physical Exam Updated Vital Signs BP 140/65   Pulse (!) 106   Temp 99.7 F (37.6 C) (Oral)   Resp 20   Ht 1.676 m (5' 6")   Wt 89.8 kg (198 lb)   SpO2 96%   BMI 31.96 kg/m   Physical Exam  Constitutional: He is oriented to person, place, and time. He appears well-developed and well-nourished. No distress.  HENT:  Head: Normocephalic and atraumatic.  Mouth/Throat: Oropharynx is clear and moist.  Eyes: Conjunctivae and EOM are normal. Pupils are equal, round, and reactive to light.  Neck: Normal range of motion. Neck supple.  Cardiovascular: Normal rate, regular rhythm and normal heart sounds.  Pulmonary/Chest: Effort normal and breath sounds normal. He has no  wheezes.  Abdominal: Soft. Bowel sounds are normal. There is no tenderness.  Musculoskeletal: Normal range of motion. He exhibits no edema.  Neurological: He is alert and oriented to person, place, and time. No cranial nerve deficit. He exhibits normal muscle tone. Coordination normal.  Skin: Skin is warm.  Nursing note and vitals reviewed.    ED Treatments / Results  Labs (all labs ordered are listed, but only abnormal results are displayed) Labs Reviewed  CBC WITH DIFFERENTIAL/PLATELET - Abnormal; Notable for the following components:      Result Value   Hemoglobin 12.7 (*)    HCT 38.7 (*)    All other components within normal limits  COMPREHENSIVE METABOLIC PANEL - Abnormal; Notable for the following components:   Chloride 99 (*)    Glucose, Bld 121 (*)  AST 44 (*)    All other components within normal limits    EKG  EKG Interpretation None       Radiology Dg Chest 2 View  Result Date: 09/23/2017 CLINICAL DATA:  Cough EXAM: CHEST  2 VIEW COMPARISON:  None. FINDINGS: The heart size and mediastinal contours are within normal limits. Both lungs are clear. The visualized skeletal structures are unremarkable. IMPRESSION: No active cardiopulmonary disease. Electronically Signed   By: Donavan Foil M.D.   On: 09/23/2017 19:21    Procedures Procedures (including critical care time)  Medications Ordered in ED Medications  albuterol (PROVENTIL HFA;VENTOLIN HFA) 108 (90 Base) MCG/ACT inhaler 2 puff (not administered)  ipratropium-albuterol (DUONEB) 0.5-2.5 (3) MG/3ML nebulizer solution 3 mL (3 mLs Nebulization Given 09/24/17 2203)  predniSONE (DELTASONE) tablet 60 mg (60 mg Oral Given 09/24/17 2154)     Initial Impression / Assessment and Plan / ED Course  I have reviewed the triage vital signs and the nursing notes.  Pertinent labs & imaging results that were available during my care of the patient were reviewed by me and considered in my medical decision making (see  chart for details).     Patient symptoms predominantly respiratory in nature.  Chest x-ray from yesterday reviewed negative for pneumonia.  Influenza test was also negative.  Today's labs without significant abnormalities.  Lungs without wheezing but patient felt sniffily better after our albuterol Atrovent nebulizer.  Patient also given 60 mg of prednisone here.  Patient does have diabetes he was precautioned about the effects of the prednisone and on raising his blood sugar.  Patient will be continued on prednisone for the next 5 days.  He will be treated with an albuterol inhaler every 6 hours for the next 7 days.  And patient also be treated Mucinex DM.  Patient does not want to take Augmentin based on chest x-ray being negative for pneumonia he can probably forego the Augmentin.  Also told him to hold on the codeine-based cough medicine and try the things that we have prescribed.  Patient's oxygen saturation in the mid 90s.  Final Clinical Impressions(s) / ED Diagnoses   Final diagnoses:  Acute bronchitis, unspecified organism    ED Discharge Orders        Ordered    predniSONE (DELTASONE) 10 MG tablet  Daily     09/25/17 0003    dextromethorphan-guaiFENesin (MUCINEX DM) 30-600 MG 12hr tablet  2 times daily     09/25/17 0003       Fredia Sorrow, MD 09/25/17 0009

## 2017-09-24 NOTE — Telephone Encounter (Signed)
Pt can not describe how he felt, except he was disoriented for a few minutes. Could this be the med, please advise, asap. He does think med is working

## 2017-09-24 NOTE — ED Triage Notes (Signed)
Pt c/o cough and difficulty breathing; pt states he went to his PCP yesterday and was given cough medicine and antibiotic with no relief

## 2017-09-24 NOTE — Telephone Encounter (Signed)
If he is having any SOB, chest tightness, oral swelling, or  rash he needs to stop medications. His sinusitis could be causing him dizziness and feeling "bad". If he thinks he is having a reaction he may need to be seen.

## 2017-09-24 NOTE — ED Notes (Signed)
Pt reports to this nurse that he started amoxicillin yesterday and feels he should already be feeling better.  Informed pt that it takes a few days for the medication to start helping.  Pt states his doctor told him he had the flu and upper resp infection.  Pt had a chest x-ray yesterday

## 2017-09-25 MED ORDER — DM-GUAIFENESIN ER 30-600 MG PO TB12: 1.0000 | ORAL_TABLET | Freq: Two times a day (BID) | ORAL | 1 refills | Status: DC

## 2017-09-25 MED ORDER — PREDNISONE 10 MG PO TABS: 40.0000 mg | ORAL_TABLET | Freq: Every day | ORAL | 0 refills | Status: DC

## 2017-09-25 MED ORDER — ALBUTEROL SULFATE HFA 108 (90 BASE) MCG/ACT IN AERS
2.0000 | INHALATION_SPRAY | Freq: Four times a day (QID) | RESPIRATORY_TRACT | Status: DC
  Administered 2017-09-25: 2 via RESPIRATORY_TRACT
  Filled 2017-09-25: qty 6.7

## 2017-09-25 NOTE — Discharge Instructions (Signed)
Schedule appointment to follow-up with your primary care doctor.  Chest x-ray from yesterday was negative for pneumonia.  Your flu test was also negative.  Today's labs without significant abnormalities.  Use the albuterol inhaler every 6 hours for the next 7 days.  Prednisone daily for the next 5 days.  Return for any new or worse symptoms.  Using continue her other medications.  Would hold on the cough medicine that they prescribed from her primary care office yesterday.  So okay to take the Augmentin but if you feel that that made you feel worse you can hold that because there is no signs of pneumonia.

## 2017-09-25 NOTE — Progress Notes (Signed)
Your chest x-ray looked normal. Thanks, WS.

## 2017-09-28 ENCOUNTER — Other Ambulatory Visit: Payer: Self-pay | Admitting: Family Medicine

## 2017-09-28 ENCOUNTER — Other Ambulatory Visit: Payer: Self-pay | Admitting: Family

## 2017-09-28 DIAGNOSIS — E785 Hyperlipidemia, unspecified: Secondary | ICD-10-CM

## 2017-09-30 ENCOUNTER — Telehealth: Payer: Self-pay | Admitting: *Deleted

## 2017-09-30 MED ORDER — PREDNISONE 10 MG (21) PO TBPK: ORAL_TABLET | ORAL | 0 refills | Status: DC

## 2017-09-30 MED ORDER — ICOSAPENT ETHYL 1 G PO CAPS: 2.0000 g | ORAL_CAPSULE | Freq: Two times a day (BID) | ORAL | 3 refills | Status: DC

## 2017-09-30 NOTE — Telephone Encounter (Signed)
Pt requesting refill on Prednisone Pt has continued congestion Prednisone has helped BS have remained stable per pt Please review and advise

## 2017-09-30 NOTE — Telephone Encounter (Signed)
RX changed to Vascepa. Prescription sent to pharmacy

## 2017-09-30 NOTE — Telephone Encounter (Signed)
Prednisone Prescription sent to pharmacy   

## 2017-09-30 NOTE — Telephone Encounter (Signed)
Patient aware.

## 2017-09-30 NOTE — Telephone Encounter (Signed)
Pt seen by triage nurse today brought letter from [employer] Omega-3-Acid cap 1 gm is not on formulary Please advise on alternative

## 2017-10-15 ENCOUNTER — Other Ambulatory Visit: Payer: Self-pay | Admitting: Family

## 2017-10-15 MED ORDER — HYDROCODONE-HOMATROPINE 5-1.5 MG/5ML PO SYRP: 5.0000 mL | ORAL_SOLUTION | Freq: Four times a day (QID) | ORAL | 0 refills | Status: DC | PRN

## 2017-10-19 ENCOUNTER — Other Ambulatory Visit: Payer: Self-pay | Admitting: Family

## 2017-11-18 ENCOUNTER — Other Ambulatory Visit: Payer: Self-pay | Admitting: *Deleted

## 2017-11-18 DIAGNOSIS — E785 Hyperlipidemia, unspecified: Secondary | ICD-10-CM

## 2017-11-18 DIAGNOSIS — E1165 Type 2 diabetes mellitus with hyperglycemia: Secondary | ICD-10-CM

## 2017-11-18 DIAGNOSIS — I10 Essential (primary) hypertension: Secondary | ICD-10-CM

## 2017-11-18 DIAGNOSIS — E1142 Type 2 diabetes mellitus with diabetic polyneuropathy: Secondary | ICD-10-CM

## 2017-11-18 DIAGNOSIS — N401 Enlarged prostate with lower urinary tract symptoms: Secondary | ICD-10-CM

## 2017-11-18 MED ORDER — SIMVASTATIN 20 MG PO TABS: 20.0000 mg | ORAL_TABLET | Freq: Every day | ORAL | 0 refills | Status: DC

## 2017-11-18 MED ORDER — GABAPENTIN 300 MG PO CAPS: ORAL_CAPSULE | ORAL | 0 refills | Status: DC

## 2017-11-18 MED ORDER — TAMSULOSIN HCL 0.4 MG PO CAPS: ORAL_CAPSULE | ORAL | 0 refills | Status: DC

## 2017-11-18 MED ORDER — GLIMEPIRIDE 2 MG PO TABS: 2.0000 mg | ORAL_TABLET | Freq: Every morning | ORAL | 0 refills | Status: DC

## 2017-11-18 MED ORDER — LOSARTAN POTASSIUM 100 MG PO TABS: ORAL_TABLET | ORAL | 0 refills | Status: DC

## 2017-11-18 MED ORDER — METFORMIN HCL 500 MG PO TABS: ORAL_TABLET | ORAL | 0 refills | Status: DC

## 2017-12-02 ENCOUNTER — Ambulatory Visit (INDEPENDENT_AMBULATORY_CARE_PROVIDER_SITE_OTHER): Payer: Medicare HMO | Admitting: Family

## 2017-12-02 ENCOUNTER — Encounter: Payer: Self-pay | Admitting: Family

## 2017-12-02 VITALS — BP 126/77 | HR 98 | Temp 97.1°F | Ht 66.0 in | Wt 184.4 lb

## 2017-12-02 DIAGNOSIS — E559 Vitamin D deficiency, unspecified: Secondary | ICD-10-CM | POA: Diagnosis not present

## 2017-12-02 DIAGNOSIS — E663 Overweight: Secondary | ICD-10-CM | POA: Diagnosis not present

## 2017-12-02 DIAGNOSIS — E1165 Type 2 diabetes mellitus with hyperglycemia: Secondary | ICD-10-CM | POA: Diagnosis not present

## 2017-12-02 DIAGNOSIS — G4700 Insomnia, unspecified: Secondary | ICD-10-CM

## 2017-12-02 DIAGNOSIS — I10 Essential (primary) hypertension: Secondary | ICD-10-CM

## 2017-12-02 DIAGNOSIS — E785 Hyperlipidemia, unspecified: Secondary | ICD-10-CM | POA: Diagnosis not present

## 2017-12-02 DIAGNOSIS — E1169 Type 2 diabetes mellitus with other specified complication: Secondary | ICD-10-CM

## 2017-12-02 DIAGNOSIS — N401 Enlarged prostate with lower urinary tract symptoms: Secondary | ICD-10-CM

## 2017-12-02 DIAGNOSIS — E1142 Type 2 diabetes mellitus with diabetic polyneuropathy: Secondary | ICD-10-CM | POA: Diagnosis not present

## 2017-12-02 DIAGNOSIS — E1159 Type 2 diabetes mellitus with other circulatory complications: Secondary | ICD-10-CM | POA: Diagnosis not present

## 2017-12-02 LAB — BAYER DCA HB A1C WAIVED: HB A1C: 7.7 % — AB (ref <7.0)

## 2017-12-02 NOTE — Patient Instructions (Signed)
Benign Prostatic Hyperplasia  Benign prostatic hyperplasia (BPH) is an enlarged prostate gland that is caused by the normal aging process and not by cancer. The prostate is a walnut-sized gland that is involved in the production of semen. It is located in front of the rectum and below the bladder. The bladder stores urine and the urethra is the tube that carries the urine out of the body. The prostate may get bigger as a man gets older.  An enlarged prostate can press on the urethra. This can make it harder to pass urine. The build-up of urine in the bladder can cause infection. Back pressure and infection may progress to bladder damage and kidney (renal) failure.  What are the causes?  This condition is part of a normal aging process. However, not all men develop problems from this condition. If the prostate enlarges away from the urethra, urine flow will not be blocked. If it enlarges toward the urethra and compresses it, there will be problems passing urine.  What increases the risk?  This condition is more likely to develop in men over the age of 50 years.  What are the signs or symptoms?  Symptoms of this condition include:  · Getting up often during the night to urinate.  · Needing to urinate frequently during the day.  · Difficulty starting urine flow.  · Decrease in size and strength of your urine stream.  · Leaking (dribbling) after urinating.  · Inability to pass urine. This needs immediate treatment.  · Inability to completely empty your bladder.  · Pain when you pass urine. This is more common if there is also an infection.  · Urinary tract infection (UTI).    How is this diagnosed?  This condition is diagnosed based on your medical history, a physical exam, and your symptoms. Tests will also be done, such as:  · A post-void bladder scan. This measures any amount of urine that may remain in your bladder after you finish urinating.  · A digital rectal exam. In a rectal exam, your health care provider  checks your prostate by putting a lubricated, gloved finger into your rectum to feel the back of your prostate gland. This exam detects the size of your gland and any abnormal lumps or growths.  · An exam of your urine (urinalysis).  · A prostate specific antigen (PSA) screening. This is a blood test used to screen for prostate cancer.  · An ultrasound. This test uses sound waves to electronically produce a picture of your prostate gland.    Your health care provider may refer you to a specialist in kidney and prostate diseases (urologist).  How is this treated?  Once symptoms begin, your health care provider will monitor your condition (active surveillance or watchful waiting). Treatment for this condition will depend on the severity of your condition. Treatment may include:  · Observation and yearly exams. This may be the only treatment needed if your condition and symptoms are mild.  · Medicines to relieve your symptoms, including:  ? Medicines to shrink the prostate.  ? Medicines to relax the muscle of the prostate.  · Surgery in severe cases. Surgery may include:  ? Prostatectomy. In this procedure, the prostate tissue is removed completely through an open incision or with a laparascope or robotics.  ? Transurethral resection of the prostate (TURP). In this procedure, a tool is inserted through the opening at the tip of the penis (urethra). It is used to cut away tissue of   the inner core of the prostate. The pieces are removed through the same opening of the penis. This removes the blockage.  ? Transurethral incision (TUIP). In this procedure, small cuts are made in the prostate. This lessens the prostate's pressure on the urethra.  ? Transurethral microwave thermotherapy (TUMT). This procedure uses microwaves to create heat. The heat destroys and removes a small amount of prostate tissue.  ? Transurethral needle ablation (TUNA). This procedure uses radio frequencies to destroy and remove a small amount of  prostate tissue.  ? Interstitial laser coagulation (ILC). This procedure uses a laser to destroy and remove a small amount of prostate tissue.  ? Transurethral electrovaporization (TUVP). This procedure uses electrodes to destroy and remove a small amount of prostate tissue.  ? Prostatic urethral lift. This procedure inserts an implant to push the lobes of the prostate away from the urethra.    Follow these instructions at home:  · Take over-the-counter and prescription medicines only as told by your health care provider.  · Monitor your symptoms for any changes. Contact your health care provider with any changes.  · Avoid drinking large amounts of liquid before going to bed or out in public.  · Avoid or reduce how much caffeine or alcohol you drink.  · Give yourself time when you urinate.  · Keep all follow-up visits as told by your health care provider. This is important.  Contact a health care provider if:  · You have unexplained back pain.  · Your symptoms do not get better with treatment.  · You develop side effects from the medicine you are taking.  · Your urine becomes very dark or has a bad smell.  · Your lower abdomen becomes distended and you have trouble passing your urine.  Get help right away if:  · You have a fever or chills.  · You suddenly cannot urinate.  · You feel lightheaded, or very dizzy, or you faint.  · There are large amounts of blood or clots in the urine.  · Your urinary problems become hard to manage.  · You develop moderate to severe low back or flank pain. The flank is the side of your body between the ribs and the hip.  These symptoms may represent a serious problem that is an emergency. Do not wait to see if the symptoms will go away. Get medical help right away. Call your local emergency services (911 in the [place] Do not drive yourself to the hospital.  Summary  · Benign prostatic hyperplasia (BPH) is an enlarged prostate that is caused by the normal aging process and not by  cancer.  · An enlarged prostate can press on the urethra. This can make it hard to pass urine.  · This condition is part of a normal aging process and is more likely to develop in men over the age of 50 years.  · Get help right away if you suddenly cannot urinate.  This information is not intended to replace advice given to you by your health care provider. Make sure you discuss any questions you have with your health care provider.  Document Released: 07/14/2005 Document Revised: 08/18/2016 Document Reviewed: 08/18/2016  [employer] Interactive Patient Education © 2018 [employer]

## 2017-12-02 NOTE — Progress Notes (Signed)
Subjective:    Patient ID: Barry Derry., male    DOB: January 02, 1951, 67 y.o.   MRN: [id number]   Chief Complaint  Patient presents with  . Diabetes    three month recheck    Diabetes  He presents for his follow-up diabetic visit. He has type 2 diabetes mellitus. His disease course has been stable. There are no hypoglycemic associated symptoms. Associated symptoms include foot paresthesias. Pertinent negatives for diabetes include no blurred vision and no visual change. There are no hypoglycemic complications. Symptoms are stable. Diabetic complications include peripheral neuropathy. Pertinent negatives for diabetic complications include no CVA, heart disease, nephropathy or PVD. Risk factors for coronary artery disease include dyslipidemia, diabetes mellitus, hypertension, male sex and sedentary lifestyle. He is following a generally unhealthy diet. His overall blood glucose range is 110-130 mg/dl. Eye exam is not current.  Hypertension  This is a chronic problem. The current episode started more than 1 year ago. The problem has been resolved since onset. The problem is controlled. Associated symptoms include malaise/fatigue. Pertinent negatives include no blurred vision, peripheral edema or shortness of breath. Risk factors for coronary artery disease include dyslipidemia, diabetes mellitus, obesity and male gender. The current treatment provides moderate improvement. There is no history of kidney disease, CAD/MI, CVA or PVD.  Hyperlipidemia  This is a chronic problem. The current episode started more than 1 year ago. The problem is controlled. Recent lipid tests were reviewed and are normal. Pertinent negatives include no shortness of breath. Current antihyperlipidemic treatment includes statins. The current treatment provides mild improvement of lipids. Risk factors for coronary artery disease include diabetes mellitus, dyslipidemia, hypertension and male sex.  Insomnia  Primary  symptoms: no difficulty falling asleep, no frequent awakening, malaise/fatigue.  The current episode started more than one year. The onset quality is sudden. The problem has been waxing and waning since onset.  Benign Prostatic Hypertrophy  This is a chronic problem. The current episode started more than 1 year ago. Irritative symptoms do not include nocturia or urgency. Obstructive symptoms do not include a slower stream or a weak stream.  Diabetic Neuropathy PT states since starting the Lyrica he does not have any pain.    Review of Systems  Constitutional: Positive for malaise/fatigue.  Eyes: Negative for blurred vision.  Respiratory: Negative for shortness of breath.   Genitourinary: Negative for nocturia and urgency.  Psychiatric/Behavioral: The patient has insomnia.   All other systems reviewed and are negative.      Objective:   Physical Exam  Constitutional: He is oriented to person, place, and time. He appears well-developed and well-nourished. No distress.  HENT:  Head: Normocephalic.  Right Ear: External ear normal.  Left Ear: External ear normal.  Mouth/Throat: Oropharynx is clear and moist.  Eyes: Pupils are equal, round, and reactive to light. Right eye exhibits no discharge. Left eye exhibits no discharge.  Neck: Normal range of motion. Neck supple. No thyromegaly present.  Cardiovascular: Normal rate, regular rhythm, normal heart sounds and intact distal pulses.  No murmur heard. Pulmonary/Chest: Effort normal and breath sounds normal. No respiratory distress. He has no wheezes.  Abdominal: Soft. Bowel sounds are normal. He exhibits no distension. There is no tenderness.  Musculoskeletal: Normal range of motion. He exhibits no edema or tenderness.  Neurological: He is alert and oriented to person, place, and time. He has normal reflexes.  Skin: Skin is warm and dry. No rash noted. No erythema.  Psychiatric: He has a normal mood  and affect. His behavior is normal.  Judgment and thought content normal.  Vitals reviewed.     BP 126/77   Pulse 98   Temp (!) 97.1 F (36.2 C) (Oral)   Ht _0  (1.676 m)   Wt 184 lb 6.4 oz (83.6 kg)   BMI 29.76 kg/m      Assessment & Plan:  Barry Doe Sr. comes in today with chief complaint of Diabetes (three month recheck) and Medical Management of Chronic Issues   Diagnosis and orders addressed:  1. Type 2 diabetes mellitus with hyperglycemia, without long-term current use of insulin (HCC) - Bayer DCA Hb A1c Waived - CMP14+EGFR  2. Hyperlipidemia associated with type 2 diabetes mellitus ([place]) - CMP14+EGFR  3. Hypertension associated with diabetes ([place]) - CMP14+EGFR  4. Diabetic polyneuropathy associated with type 2 diabetes mellitus ([place]) - CMP14+EGFR  5. Benign prostatic hyperplasia with lower urinary tract symptoms, symptom details unspecified PT states his symptoms have greatly improved since increasing his water, he will do a trial without flomax to see how he does - CMP14+EGFR  6. Overweight (BMI 25.0-29.9) - CMP14+EGFR  7. Vitamin D deficiency - CMP14+EGFR  8. Insomnia, unspecified type - CMP14+EGFR   Labs pending Health Maintenance reviewed Diet and exercise encouraged  Follow up plan: 4 months   Evelina Dun, FNP

## 2017-12-03 LAB — CMP14+EGFR
A/G RATIO: 2.1 (ref 1.2–2.2)
ALT: 26 IU/L (ref 0–44)
AST: 22 IU/L (ref 0–40)
Albumin: 4.8 g/dL (ref 3.6–4.8)
Alkaline Phosphatase: 55 IU/L (ref 39–117)
BILIRUBIN TOTAL: 0.3 mg/dL (ref 0.0–1.2)
BUN / CREAT RATIO: 8 — AB (ref 10–24)
BUN: 7 mg/dL — ABNORMAL LOW (ref 8–27)
CO2: 20 mmol/L (ref 20–29)
CREATININE: 0.83 mg/dL (ref 0.76–1.27)
Calcium: 9.6 mg/dL (ref 8.6–10.2)
Chloride: 102 mmol/L (ref 96–106)
GFR calc Af Amer: 105 mL/min/{1.73_m2} (ref >59)
GFR calc non Af Amer: 91 mL/min/{1.73_m2} (ref >59)
GLUCOSE: 159 mg/dL — AB (ref 65–99)
Globulin, Total: 2.3 g/dL (ref 1.5–4.5)
Potassium: 4.6 mmol/L (ref 3.5–5.2)
SODIUM: 141 mmol/L (ref 134–144)
Total Protein: 7.1 g/dL (ref 6.0–8.5)

## 2017-12-17 ENCOUNTER — Ambulatory Visit (INDEPENDENT_AMBULATORY_CARE_PROVIDER_SITE_OTHER): Payer: Medicare HMO | Admitting: Family

## 2017-12-17 ENCOUNTER — Encounter: Payer: Self-pay | Admitting: Family

## 2017-12-17 VITALS — BP 115/84 | HR 95 | Temp 97.3°F | Ht 66.0 in | Wt 184.0 lb

## 2017-12-17 DIAGNOSIS — J189 Pneumonia, unspecified organism: Secondary | ICD-10-CM

## 2017-12-17 MED ORDER — LEVOFLOXACIN 500 MG PO TABS: 500.0000 mg | ORAL_TABLET | Freq: Every day | ORAL | 0 refills | Status: DC

## 2017-12-17 MED ORDER — PREDNISONE 10 MG (21) PO TBPK: ORAL_TABLET | ORAL | 0 refills | Status: DC

## 2017-12-17 MED ORDER — HYDROCODONE-HOMATROPINE 5-1.5 MG/5ML PO SYRP: 5.0000 mL | ORAL_SOLUTION | Freq: Three times a day (TID) | ORAL | 0 refills | Status: DC | PRN

## 2017-12-17 NOTE — Progress Notes (Signed)
   Subjective:    Patient ID: Barry Smith., male    DOB: 1950-08-24, 67 y.o.   MRN: [id number]  Chief Complaint  Patient presents with  . cough with clear sputum  . Fatigue    Cough  This is a recurrent problem. The current episode started more than 1 month ago. The problem has been waxing and waning. The problem occurs every few minutes. The cough is productive of sputum. Associated symptoms include a fever, myalgias, postnasal drip and rhinorrhea. Pertinent negatives include no chills, ear congestion, ear pain, shortness of breath or wheezing. Associated symptoms comments: Fatigue . The symptoms are aggravated by pollens and lying down. He has tried rest, OTC cough suppressant and oral steroids for the symptoms. The treatment provided mild relief. There is no history of COPD.      Review of Systems  Constitutional: Positive for fever. Negative for chills.  HENT: Positive for postnasal drip and rhinorrhea. Negative for ear pain.   Respiratory: Positive for cough. Negative for shortness of breath and wheezing.   Musculoskeletal: Positive for myalgias.  All other systems reviewed and are negative.      Objective:   Physical Exam  Constitutional: He is oriented to person, place, and time. He appears well-developed and well-nourished. No distress.  HENT:  Head: Normocephalic.  Right Ear: External ear normal.  Left Ear: External ear normal.  Nose: Nose normal.  Mouth/Throat: Oropharynx is clear and moist.  Eyes: Pupils are equal, round, and reactive to light. Right eye exhibits no discharge. Left eye exhibits no discharge.  Neck: Normal range of motion. Neck supple. No thyromegaly present.  Cardiovascular: Normal rate, regular rhythm, normal heart sounds and intact distal pulses.  No murmur heard. Pulmonary/Chest: Effort normal. No respiratory distress. He has no wheezes. He has rales in the right lower field.  Constant nonproductive cough  Abdominal: Soft. Bowel sounds  are normal. He exhibits no distension. There is no tenderness.  Musculoskeletal: Normal range of motion. He exhibits no edema or tenderness.  Neurological: He is alert and oriented to person, place, and time. He has normal reflexes. No cranial nerve deficit.  Skin: Skin is warm and dry. No rash noted. No erythema.  Psychiatric: He has a normal mood and affect. His behavior is normal. Judgment and thought content normal.  Vitals reviewed.     BP 115/84   Pulse 95   Temp (!) 97.3 F (36.3 C) (Oral)   Ht  (1.676 m)   Wt 184 lb (83.5 kg)   BMI 29.70 kg/m      Assessment & Plan:  [name] was seen today for cough with clear sputum and fatigue.  Diagnoses and all orders for this visit:  Community acquired pneumonia, unspecified laterality -     predniSONE (STERAPRED UNI-PAK 21 TAB) 10 MG (21) TBPK tablet; Use as directed -     levofloxacin (LEVAQUIN) 500 MG tablet; Take 1 tablet (500 mg total) by mouth daily.  Other orders -     HYDROcodone-homatropine (HYCODAN) 5-1.5 MG/5ML syrup; Take 5 mLs by mouth every 8 (eight) hours as needed for cough.    - Take meds as prescribed - Use a cool mist humidifier  -Use saline nose sprays frequently -Force fluids -For any cough or congestion  Use plain Mucinex- regular strength or max strength is fine -For fever or aces or pains- take tylenol or ibuprofen. -Throat lozenges if help   Jannifer Rodney, FNP

## 2017-12-17 NOTE — Patient Instructions (Signed)

## 2017-12-30 ENCOUNTER — Encounter: Payer: Self-pay | Admitting: Family

## 2017-12-30 ENCOUNTER — Ambulatory Visit (INDEPENDENT_AMBULATORY_CARE_PROVIDER_SITE_OTHER): Payer: Medicare HMO | Admitting: Family

## 2017-12-30 VITALS — BP 135/81 | HR 107 | Temp 97.1°F | Ht 66.0 in | Wt 183.0 lb

## 2017-12-30 DIAGNOSIS — E1165 Type 2 diabetes mellitus with hyperglycemia: Secondary | ICD-10-CM

## 2017-12-30 DIAGNOSIS — R3989 Other symptoms and signs involving the genitourinary system: Secondary | ICD-10-CM | POA: Diagnosis not present

## 2017-12-30 DIAGNOSIS — R739 Hyperglycemia, unspecified: Secondary | ICD-10-CM | POA: Diagnosis not present

## 2017-12-30 DIAGNOSIS — R350 Frequency of micturition: Secondary | ICD-10-CM | POA: Diagnosis not present

## 2017-12-30 LAB — URINALYSIS, COMPLETE
BILIRUBIN UA: NEGATIVE
KETONES UA: NEGATIVE
LEUKOCYTES UA: NEGATIVE
Nitrite, UA: NEGATIVE
PH UA: 5 (ref 5.0–7.5)
PROTEIN UA: NEGATIVE
RBC UA: NEGATIVE
SPEC GRAV UA: 1.02 (ref 1.005–1.030)
UUROB: 0.2 mg/dL (ref 0.2–1.0)

## 2017-12-30 LAB — MICROSCOPIC EXAMINATION
Bacteria, UA: NONE SEEN
Epithelial Cells (non renal): NONE SEEN /hpf (ref 0–10)
RBC, UA: NONE SEEN /hpf (ref 0–2)
Renal Epithel, UA: NONE SEEN /hpf
WBC, UA: NONE SEEN /hpf (ref 0–5)

## 2017-12-30 LAB — GLUCOSE HEMOCUE WAIVED: GLU HEMOCUE WAIVED: 414 mg/dL — AB (ref 65–99)

## 2017-12-30 MED ORDER — INSULIN DEGLUDEC 100 UNIT/ML ~~LOC~~ SOLN: 8.0000 [IU] | Freq: Every day | SUBCUTANEOUS | 3 refills | Status: DC

## 2017-12-30 NOTE — Patient Instructions (Addendum)
Hyperglycemia Hyperglycemia occurs when the level of sugar (glucose) in the blood is too high. Glucose is a type of sugar that provides the body's main source of energy. Certain hormones (insulin and glucagon) control the level of glucose in the blood. Insulin lowers blood glucose, and glucagon increases blood glucose. Hyperglycemia can result from having too little insulin in the bloodstream, or from the body not responding normally to insulin. Hyperglycemia occurs most often in people who have diabetes (diabetes mellitus), but it can happen in people who do not have diabetes. It can develop quickly, and it can be life-threatening if it causes you to become severely dehydrated (diabetic ketoacidosis or hyperglycemic hyperosmolar state). Severe hyperglycemia is a medical emergency. What are the causes? If you have diabetes, hyperglycemia may be caused by:  Diabetes medicine.  Medicines that increase blood glucose or affect your diabetes control.  Not eating enough, or not eating often enough.  Changes in physical activity level.  Being sick or having an infection.  If you have prediabetes or undiagnosed diabetes:  Hyperglycemia may be caused by those conditions.  If you do not have diabetes, hyperglycemia may be caused by:  Certain medicines, including steroid medicines, beta-blockers, epinephrine, and thiazide diuretics.  Stress.  Serious illness.  Surgery.  Diseases of the pancreas.  Infection.  What increases the risk? Hyperglycemia is more likely to develop in people who have risk factors for diabetes, such as:  Having a family member with diabetes.  Having a gene for type 1 diabetes that is passed from parent to child (inherited).  Living in an area with cold weather conditions.  Exposure to certain viruses.  Certain conditions in which the body's disease-fighting (immune) system attacks itself (autoimmune disorders).  Being overweight or obese.  Having an  inactive (sedentary) lifestyle.  Having been diagnosed with insulin resistance.  Having a history of prediabetes, gestational diabetes, or polycystic ovarian syndrome (PCOS).  Being of American-Indian, African-American, Hispanic/Latino, or Asian/Pacific Islander descent.  What are the signs or symptoms? Hyperglycemia may not cause any symptoms. If you do have symptoms, they may include early warning signs, such as:  Increased thirst.  Hunger.  Feeling very tired.  Needing to urinate more often than usual.  Blurry vision.  Other symptoms may develop if hyperglycemia gets worse, such as:  Dry mouth.  Loss of appetite.  Fruity-smelling breath.  Weakness.  Unexpected or rapid weight gain or weight loss.  Tingling or numbness in the hands or feet.  Headache.  Skin that does not quickly return to normal after being lightly pinched and released (poor skin turgor).  Abdominal pain.  Cuts or bruises that are slow to heal.  How is this diagnosed? Hyperglycemia is diagnosed with a blood test to measure your blood glucose level. This blood test is usually done while you are having symptoms. Your health care provider may also do a physical exam and review your medical history. You may have more tests to determine the cause of your hyperglycemia, such as:  A fasting blood glucose (FBG) test. You will not be allowed to eat (you will fast) for at least 8 hours before a blood sample is taken.  An A1c (hemoglobin A1c) blood test. This provides information about blood glucose control over the previous 2-3 months.  An oral glucose tolerance test (OGTT). This measures your blood glucose at two times: ? After fasting. This is your baseline blood glucose level. ? Two hours after drinking a beverage that contains glucose.  How is   this treated? Treatment depends on the cause of your hyperglycemia. Treatment may include:  Taking medicine to regulate your blood glucose levels. If you  take insulin or other diabetes medicines, your medicine or dosage may be adjusted.  Lifestyle changes, such as exercising more, eating healthier foods, or losing weight.  Treating an illness or infection, if this caused your hyperglycemia.  Checking your blood glucose more often.  Stopping or reducing steroid medicines, if these caused your hyperglycemia.  If your hyperglycemia becomes severe and it results in hyperglycemic hyperosmolar state, you must be hospitalized and given IV fluids. Follow these instructions at home: General instructions  Take over-the-counter and prescription medicines only as told by your health care provider.  Do not use any products that contain nicotine or tobacco, such as cigarettes and e-cigarettes. If you need help quitting, ask your health care provider.  Limit alcohol intake to no more than 1 drink per day for nonpregnant women and 2 drinks per day for men. One drink equals 12 oz of beer, 5 oz of wine, or 1 oz of hard liquor.  Learn to manage stress. If you need help with this, ask your health care provider.  Keep all follow-up visits as told by your health care provider. This is important. Eating and drinking  Maintain a healthy weight.  Exercise regularly, as directed by your health care provider.  Stay hydrated, especially when you exercise, get sick, or spend time in hot temperatures.  Eat healthy foods, such as: ? Lean proteins. ? Complex carbohydrates. ? Fresh fruits and vegetables. ? Low-fat dairy products. ? Healthy fats.  Drink enough fluid to keep your urine clear or pale yellow. If you have diabetes:   Make sure you know the symptoms of hyperglycemia.  Follow your diabetes management plan, as told by your health care provider. Make sure you: ? Take your insulin and medicines as directed. ? Follow your exercise plan. ? Follow your meal plan. Eat on time, and do not skip meals. ? Check your blood glucose as often as directed.  Make sure to check your blood glucose before and after exercise. If you exercise longer or in a different way than usual, check your blood glucose more often. ? Follow your sick day plan whenever you cannot eat or drink normally. Make this plan in advance with your health care provider.  Share your diabetes management plan with people in your workplace, school, and household.  Check your urine for ketones when you are ill and as told by your health care provider.  Carry a medical alert card or wear medical alert jewelry. Contact a health care provider if:  Your blood glucose is at or above 240 mg/dL (13.3 mmol/L) for 2 days in a row.  You have problems keeping your blood glucose in your target range.  You have frequent episodes of hyperglycemia. Get help right away if:  You have difficulty breathing.  You have a change in how you think, feel, or act (mental status).  You have nausea or vomiting that does not go away. These symptoms may represent a serious problem that is an emergency. Do not wait to see if the symptoms will go away. Get medical help right away. Call your local emergency services (911 in the [place] Do not drive yourself to the hospital. Summary  Hyperglycemia occurs when the level of sugar (glucose) in the blood is too high.  Hyperglycemia is diagnosed with a blood test to measure your blood glucose level. This blood   test is usually done while you are having symptoms. Your health care provider may also do a physical exam and review your medical history.  If you have diabetes, follow your diabetes management plan as told by your health care provider.  Contact your health care provider if you have problems keeping your blood glucose in your target range. This information is not intended to replace advice given to you by your health care provider. Make sure you discuss any questions you have with your health care provider. Document Released: 01/07/2001 Document Revised:  03/31/2016 Document Reviewed: 03/31/2016 Elsevier Interactive Patient Education  2018 [employer]  

## 2017-12-30 NOTE — Progress Notes (Signed)
   Subjective:    Patient ID: Barry JubileeBill Glenn Putman Sr., male    DOB: 1950-11-04, 67 y.o.   MRN: [id number]  Chief Complaint  Patient presents with  . urine troubles    pressure to void but small amounts of urine    Urinary Frequency   This is a new problem. The current episode started in the past 7 days. The problem occurs every urination. The problem has been gradually worsening. The quality of the pain is described as burning. The pain is at a severity of 1/10. The pain is mild. There has been no fever. Associated symptoms include frequency, hesitancy and urgency. Pertinent negatives include no discharge, flank pain, hematuria, nausea or vomiting. He has tried increased fluids for the symptoms. The treatment provided mild relief.      Review of Systems  Gastrointestinal: Negative for nausea and vomiting.  Genitourinary: Positive for frequency, hesitancy and urgency. Negative for flank pain and hematuria.  All other systems reviewed and are negative.      Objective:   Physical Exam  Constitutional: He is oriented to person, place, and time. He appears well-developed and well-nourished. No distress.  HENT:  Head: Normocephalic.  Eyes: Pupils are equal, round, and reactive to light. Right eye exhibits no discharge. Left eye exhibits no discharge.  Neck: Normal range of motion. Neck supple. No thyromegaly present.  Cardiovascular: Normal rate, regular rhythm, normal heart sounds and intact distal pulses.  No murmur heard. Pulmonary/Chest: Effort normal and breath sounds normal. No respiratory distress. He has no wheezes.  Abdominal: Soft. Bowel sounds are normal. He exhibits no distension. There is no tenderness.  Musculoskeletal: Normal range of motion. He exhibits no edema or tenderness.  Neurological: He is alert and oriented to person, place, and time. He has normal reflexes. No cranial nerve deficit.  Skin: Skin is warm and dry. No rash noted. No erythema.  Psychiatric: He has a  normal mood and affect. His behavior is normal. Judgment and thought content normal.  Vitals reviewed.   BP 135/81   Pulse (!) 107   Temp (!) 97.1 F (36.2 C) (Oral)   Ht 5\' 6"  (1.676 m)   Wt 183 lb (83 kg)   BMI 29.54 kg/m      Assessment & Plan:  Barry MaineBill Glenn Lanni Sr. comes in today with chief complaint of urine troubles (pressure to void but small amounts of urine)   Diagnosis and orders addressed:  1. Urine troubles - Urinalysis, Complete  2. Urinary frequency - Glucose Hemocue Waived  3. Hyperglycemia - Insulin Degludec (TRESIBA) 100 UNIT/ML SOLN; Inject 8 Units into the skin at bedtime.  Dispense: 10 mL; Refill: 3  4. Type 2 diabetes mellitus with hyperglycemia, without long-term current use of insulin (HCC) - Insulin Degludec (TRESIBA) 100 UNIT/ML SOLN; Inject 8 Units into the skin at bedtime.  Dispense: 10 mL; Refill: 3   Glucose is 414 today. Given Tresiba 8 units today in office and instructed how to give at home. Long discussion about stopping his [place]!! Strict low carb diet.  Continue his metformin and glimepiride     Follow up plan: 10 days     Jannifer Rodneyhristy Adiyah Lame, FNP

## 2018-01-08 ENCOUNTER — Ambulatory Visit (INDEPENDENT_AMBULATORY_CARE_PROVIDER_SITE_OTHER): Payer: Medicare HMO | Admitting: Family

## 2018-01-08 ENCOUNTER — Encounter: Payer: Self-pay | Admitting: Family

## 2018-01-08 ENCOUNTER — Telehealth: Payer: Self-pay | Admitting: Family

## 2018-01-08 VITALS — BP 117/69 | HR 79 | Temp 97.0°F | Ht 66.0 in | Wt 182.6 lb

## 2018-01-08 DIAGNOSIS — E1165 Type 2 diabetes mellitus with hyperglycemia: Secondary | ICD-10-CM | POA: Diagnosis not present

## 2018-01-08 LAB — GLUCOSE HEMOCUE WAIVED: Glu Hemocue Waived: 120 mg/dL — ABNORMAL HIGH (ref 65–99)

## 2018-01-08 MED ORDER — DAPAGLIFLOZIN PROPANEDIOL 5 MG PO TABS: 5.0000 mg | ORAL_TABLET | Freq: Every day | ORAL | 1 refills | Status: DC

## 2018-01-08 NOTE — Progress Notes (Addendum)
   Subjective:    Patient ID: Barry JubileeBill Glenn Heaps Sr., male    DOB: 02/11/1951, 67 y.o.   MRN: [id number]  Chief Complaint  Patient presents with  . Hyperglycemia    one week recheck   Pt presents to the office today to recheck gluocose after being seen in the clinic on 12/30/17 and found to have a glucose of 414. We started him on Tresbia and states his BS was 130 last night so he held his insulin. States he has stopped drinking soft drinks and tried to be more conscience of his diet.  Diabetes  He presents for his follow-up diabetic visit. He has type 2 diabetes mellitus. His disease course has been improving. There are no hypoglycemic associated symptoms. Associated symptoms include blurred vision. Pertinent negatives for diabetes include no foot paresthesias and no visual change. There are no hypoglycemic complications. Symptoms are stable. Pertinent negatives for diabetic complications include no CVA or heart disease. Risk factors for coronary artery disease include diabetes mellitus, dyslipidemia, male sex and hypertension. He is following a generally unhealthy diet. His overall blood glucose range is 110-130 mg/dl.      Review of Systems  Eyes: Positive for blurred vision.  All other systems reviewed and are negative.      Objective:   Physical Exam  Constitutional: He is oriented to person, place, and time. He appears well-developed and well-nourished. No distress.  HENT:  Head: Normocephalic.  Right Ear: External ear normal.  Left Ear: External ear normal.  Mouth/Throat: Oropharynx is clear and moist.  Eyes: Pupils are equal, round, and reactive to light. Right eye exhibits no discharge. Left eye exhibits no discharge.  Neck: Normal range of motion. Neck supple. No thyromegaly present.  Cardiovascular: Normal rate, regular rhythm, normal heart sounds and intact distal pulses.  No murmur heard. Pulmonary/Chest: Effort normal and breath sounds normal. No respiratory distress. He  has no wheezes.  Abdominal: Soft. Bowel sounds are normal. He exhibits no distension. There is no tenderness.  Musculoskeletal: Normal range of motion. He exhibits no edema or tenderness.  Neurological: He is alert and oriented to person, place, and time. He has normal reflexes. No cranial nerve deficit.  Skin: Skin is warm and dry. No rash noted. No erythema.  Psychiatric: He has a normal mood and affect. His behavior is normal. Judgment and thought content normal.  Vitals reviewed.     BP 117/69   Pulse 79   Temp (!) 97 F (36.1 C) (Oral)   Ht 5\' 6"  (1.676 m)   Wt 182 lb 9.6 oz (82.8 kg)   BMI 29.47 kg/m      Assessment & Plan:  Barry MaineBill Glenn Verrette Sr. comes in today with chief complaint of Hyperglycemia (one week recheck)   Diagnosis and orders addressed:  1. Type 2 diabetes mellitus with hyperglycemia, without long-term current use of insulin (HCC) Will add [place] today and goal of stopping the [place]. PT will continue to work on being more active and low carb diet. He will decrease [place] to 5 units a night.  Low carb diet  - Glucose Hemocue Waived - dapagliflozin propanediol (FARXIGA) 5 MG TABS tablet; Take 5 mg by mouth daily.  Dispense: 30 tablet; Refill: 1   Labs pending Health Maintenance reviewed Diet and exercise encouraged  Follow up plan: 1 months   Jannifer Rodneyhristy Demara Lover, FNP

## 2018-01-08 NOTE — Patient Instructions (Signed)
Diabetes Mellitus and Nutrition When you have diabetes (diabetes mellitus), it is very important to have healthy eating habits because your blood sugar (glucose) levels are greatly affected by what you eat and drink. Eating healthy foods in the appropriate amounts, at about the same times every day, can help you:  Control your blood glucose.  Lower your risk of heart disease.  Improve your blood pressure.  Reach or maintain a healthy weight.  Every person with diabetes is different, and each person has different needs for a meal plan. Your health care provider may recommend that you work with a diet and nutrition specialist (dietitian) to make a meal plan that is best for you. Your meal plan may vary depending on factors such as:  The calories you need.  The medicines you take.  Your weight.  Your blood glucose, blood pressure, and cholesterol levels.  Your activity level.  Other health conditions you have, such as heart or kidney disease.  How do carbohydrates affect me? Carbohydrates affect your blood glucose level more than any other type of food. Eating carbohydrates naturally increases the amount of glucose in your blood. Carbohydrate counting is a method for keeping track of how many carbohydrates you eat. Counting carbohydrates is important to keep your blood glucose at a healthy level, especially if you use insulin or take certain oral diabetes medicines. It is important to know how many carbohydrates you can safely have in each meal. This is different for every person. Your dietitian can help you calculate how many carbohydrates you should have at each meal and for snack. Foods that contain carbohydrates include:  Bread, cereal, rice, pasta, and crackers.  Potatoes and corn.  Peas, beans, and lentils.  Milk and yogurt.  Fruit and juice.  Desserts, such as cakes, cookies, ice cream, and candy.  How does alcohol affect me? Alcohol can cause a sudden decrease in blood  glucose (hypoglycemia), especially if you use insulin or take certain oral diabetes medicines. Hypoglycemia can be a life-threatening condition. Symptoms of hypoglycemia (sleepiness, dizziness, and confusion) are similar to symptoms of having too much alcohol. If your health care provider says that alcohol is safe for you, follow these guidelines:  Limit alcohol intake to no more than 1 drink per day for nonpregnant women and 2 drinks per day for men. One drink equals 12 oz of beer, 5 oz of wine, or 1 oz of hard liquor.  Do not drink on an empty stomach.  Keep yourself hydrated with water, diet soda, or unsweetened iced tea.  Keep in mind that regular soda, juice, and other mixers may contain a lot of sugar and must be counted as carbohydrates.  What are tips for following this plan? Reading food labels  Start by checking the serving size on the label. The amount of calories, carbohydrates, fats, and other nutrients listed on the label are based on one serving of the food. Many foods contain more than one serving per package.  Check the total grams (g) of carbohydrates in one serving. You can calculate the number of servings of carbohydrates in one serving by dividing the total carbohydrates by 15. For example, if a food has 30 g of total carbohydrates, it would be equal to 2 servings of carbohydrates.  Check the number of grams (g) of saturated and trans fats in one serving. Choose foods that have low or no amount of these fats.  Check the number of milligrams (mg) of sodium in one serving. Most people   should limit total sodium intake to less than 2,300 mg per day.  Always check the nutrition information of foods labeled as "low-fat" or "nonfat". These foods may be higher in added sugar or refined carbohydrates and should be avoided.  Talk to your dietitian to identify your daily goals for nutrients listed on the label. Shopping  Avoid buying canned, premade, or processed foods. These  foods tend to be high in fat, sodium, and added sugar.  Shop around the outside edge of the grocery store. This includes fresh fruits and vegetables, bulk grains, fresh meats, and fresh dairy. Cooking  Use low-heat cooking methods, such as baking, instead of high-heat cooking methods like deep frying.  Cook using healthy oils, such as olive, canola, or sunflower oil.  Avoid cooking with butter, cream, or high-fat meats. Meal planning  Eat meals and snacks regularly, preferably at the same times every day. Avoid going long periods of time without eating.  Eat foods high in fiber, such as fresh fruits, vegetables, beans, and whole grains. Talk to your dietitian about how many servings of carbohydrates you can eat at each meal.  Eat 4-6 ounces of lean protein each day, such as lean meat, chicken, fish, eggs, or tofu. 1 ounce is equal to 1 ounce of meat, chicken, or fish, 1 egg, or 1/4 cup of tofu.  Eat some foods each day that contain healthy fats, such as avocado, nuts, seeds, and fish. Lifestyle   Check your blood glucose regularly.  Exercise at least 30 minutes 5 or more days each week, or as told by your health care provider.  Take medicines as told by your health care provider.  Do not use any products that contain nicotine or tobacco, such as cigarettes and e-cigarettes. If you need help quitting, ask your health care provider.  Work with a counselor or diabetes educator to identify strategies to manage stress and any emotional and social challenges. What are some questions to ask my health care provider?  Do I need to meet with a diabetes educator?  Do I need to meet with a dietitian?  What number can I call if I have questions?  When are the best times to check my blood glucose? Where to find more information:  American Diabetes Association: diabetes.org/food-and-fitness/food  Academy of Nutrition and Dietetics:  www.eatright.org/resources/health/diseases-and-conditions/diabetes  National Institute of Diabetes and Digestive and Kidney Diseases (NIH): www.niddk.nih.gov/health-information/diabetes/overview/diet-eating-physical-activity Summary  A healthy meal plan will help you control your blood glucose and maintain a healthy lifestyle.  Working with a diet and nutrition specialist (dietitian) can help you make a meal plan that is best for you.  Keep in mind that carbohydrates and alcohol have immediate effects on your blood glucose levels. It is important to count carbohydrates and to use alcohol carefully. This information is not intended to replace advice given to you by your health care provider. Make sure you discuss any questions you have with your health care provider. Document Released: 04/10/2005 Document Revised: 08/18/2016 Document Reviewed: 08/18/2016 Elsevier Interactive Patient Education  2018 Elsevier Inc.  

## 2018-01-11 MED ORDER — EMPAGLIFLOZIN 10 MG PO TABS: 10.0000 mg | ORAL_TABLET | Freq: Every day | ORAL | 1 refills | Status: DC

## 2018-01-11 NOTE — Telephone Encounter (Signed)
Patient wife aware

## 2018-01-11 NOTE — Telephone Encounter (Signed)
Will try Jardiance and see if covered better.

## 2018-01-13 ENCOUNTER — Other Ambulatory Visit: Payer: Self-pay | Admitting: Family

## 2018-01-18 ENCOUNTER — Other Ambulatory Visit: Payer: Self-pay | Admitting: Family

## 2018-01-19 ENCOUNTER — Encounter: Payer: Self-pay | Admitting: *Deleted

## 2018-01-20 ENCOUNTER — Other Ambulatory Visit: Payer: Self-pay | Admitting: Family

## 2018-01-20 DIAGNOSIS — N401 Enlarged prostate with lower urinary tract symptoms: Secondary | ICD-10-CM

## 2018-01-20 DIAGNOSIS — E1142 Type 2 diabetes mellitus with diabetic polyneuropathy: Secondary | ICD-10-CM

## 2018-01-20 DIAGNOSIS — E1165 Type 2 diabetes mellitus with hyperglycemia: Secondary | ICD-10-CM

## 2018-01-25 ENCOUNTER — Telehealth: Payer: Self-pay | Admitting: Family

## 2018-01-25 MED ORDER — SITAGLIPTIN PHOSPHATE 50 MG PO TABS: 50.0000 mg | ORAL_TABLET | Freq: Every day | ORAL | 1 refills | Status: DC

## 2018-01-25 NOTE — Telephone Encounter (Signed)
PT is scared to take the empagliflozin (JARDIANCE) 10 MG TABS tablet after seeing commercials on TV. The pt is not taking it anymore due that, due to possible flesh eating disease from the medication. Please call pt wife

## 2018-01-25 NOTE — Telephone Encounter (Signed)
Pleaase review and advise.

## 2018-01-25 NOTE — Telephone Encounter (Signed)
Stop Jardiance and start Januvia. Prescription sent to pharmacy. Low carb diet.

## 2018-01-25 NOTE — Telephone Encounter (Signed)
Begin another medication?

## 2018-01-27 ENCOUNTER — Other Ambulatory Visit: Payer: Self-pay | Admitting: Family

## 2018-01-27 ENCOUNTER — Telehealth: Payer: Self-pay | Admitting: Family

## 2018-01-27 DIAGNOSIS — E785 Hyperlipidemia, unspecified: Secondary | ICD-10-CM

## 2018-01-27 NOTE — Telephone Encounter (Signed)
Aware, per voice mail message, stop jardiance and take [place].

## 2018-01-29 NOTE — Telephone Encounter (Signed)
Ov 02/11/18

## 2018-02-02 NOTE — Telephone Encounter (Signed)
  Aware in different encounter.  This encounter will now be closed 

## 2018-02-08 ENCOUNTER — Ambulatory Visit: Payer: Medicare HMO | Admitting: Family

## 2018-02-11 ENCOUNTER — Encounter (INDEPENDENT_AMBULATORY_CARE_PROVIDER_SITE_OTHER): Payer: Self-pay | Admitting: *Deleted

## 2018-02-11 ENCOUNTER — Ambulatory Visit (INDEPENDENT_AMBULATORY_CARE_PROVIDER_SITE_OTHER): Payer: Medicare HMO | Admitting: Family

## 2018-02-11 ENCOUNTER — Encounter: Payer: Self-pay | Admitting: Family

## 2018-02-11 VITALS — BP 108/68 | HR 76 | Temp 97.0°F | Ht 66.0 in | Wt 186.6 lb

## 2018-02-11 DIAGNOSIS — Z1211 Encounter for screening for malignant neoplasm of colon: Secondary | ICD-10-CM

## 2018-02-11 DIAGNOSIS — I10 Essential (primary) hypertension: Secondary | ICD-10-CM

## 2018-02-11 DIAGNOSIS — E785 Hyperlipidemia, unspecified: Secondary | ICD-10-CM

## 2018-02-11 DIAGNOSIS — G4700 Insomnia, unspecified: Secondary | ICD-10-CM

## 2018-02-11 DIAGNOSIS — E1142 Type 2 diabetes mellitus with diabetic polyneuropathy: Secondary | ICD-10-CM | POA: Diagnosis not present

## 2018-02-11 DIAGNOSIS — E663 Overweight: Secondary | ICD-10-CM | POA: Diagnosis not present

## 2018-02-11 DIAGNOSIS — N401 Enlarged prostate with lower urinary tract symptoms: Secondary | ICD-10-CM | POA: Diagnosis not present

## 2018-02-11 DIAGNOSIS — E559 Vitamin D deficiency, unspecified: Secondary | ICD-10-CM

## 2018-02-11 DIAGNOSIS — E1165 Type 2 diabetes mellitus with hyperglycemia: Secondary | ICD-10-CM

## 2018-02-11 DIAGNOSIS — Z0000 Encounter for general adult medical examination without abnormal findings: Secondary | ICD-10-CM

## 2018-02-11 DIAGNOSIS — E1169 Type 2 diabetes mellitus with other specified complication: Secondary | ICD-10-CM

## 2018-02-11 DIAGNOSIS — E1159 Type 2 diabetes mellitus with other circulatory complications: Secondary | ICD-10-CM

## 2018-02-11 LAB — BAYER DCA HB A1C WAIVED: HB A1C: 7.7 % — AB (ref <7.0)

## 2018-02-11 MED ORDER — DICLOFENAC SODIUM 75 MG PO TBEC: 75.0000 mg | DELAYED_RELEASE_TABLET | Freq: Two times a day (BID) | ORAL | 1 refills | Status: DC

## 2018-02-11 NOTE — Progress Notes (Signed)
Subjective:    Patient ID: Barry Smith., male    DOB: [birth year], 67 y.o.   MRN: [id number]  Chief Complaint  Patient presents with  . recheck diabetes   Pt presents to the office for CPE.  Diabetes  He presents for his follow-up diabetic visit. He has type 2 diabetes mellitus. His disease course has been stable. There are no hypoglycemic associated symptoms. Associated symptoms include foot paresthesias. Pertinent negatives for diabetes include no blurred vision and no visual change. There are no hypoglycemic complications. Symptoms are stable. Diabetic complications include peripheral neuropathy. Pertinent negatives for diabetic complications include no CVA, heart disease or nephropathy. Risk factors for coronary artery disease include diabetes mellitus, dyslipidemia, male sex and hypertension. He is following a generally healthy diet. His overall blood glucose range is 110-130 mg/dl. Eye exam is current.  Hyperlipidemia  This is a chronic problem. The current episode started more than 1 year ago. The problem is controlled. Recent lipid tests were reviewed and are normal. Pertinent negatives include no shortness of breath. Current antihyperlipidemic treatment includes statins. The current treatment provides moderate improvement of lipids.  Hypertension  This is a chronic problem. The current episode started more than 1 year ago. The problem has been resolved since onset. The problem is controlled. Pertinent negatives include no blurred vision, peripheral edema or shortness of breath. Risk factors for coronary artery disease include dyslipidemia, diabetes mellitus and male gender. The current treatment provides moderate improvement. There is no history of CVA.  Benign Prostatic Hypertrophy  This is a chronic problem. The current episode started more than 1 year ago. The problem has been resolved since onset. Irritative symptoms do not include nocturia or urgency.  Insomnia  Primary  symptoms: difficulty falling asleep, frequent awakening.  The current episode started more than one year. The onset quality is gradual.      Review of Systems  Eyes: Negative for blurred vision.  Respiratory: Negative for shortness of breath.   Genitourinary: Negative for nocturia and urgency.  Psychiatric/Behavioral: The patient has insomnia.   All other systems reviewed and are negative.      Objective:   Physical Exam  Constitutional: He is oriented to person, place, and time. He appears well-developed and well-nourished. No distress.  HENT:  Head: Normocephalic.  Right Ear: External ear normal.  Left Ear: External ear normal.  Mouth/Throat: Oropharynx is clear and moist.  Eyes: Pupils are equal, round, and reactive to light. Right eye exhibits no discharge. Left eye exhibits no discharge.  Neck: Normal range of motion. Neck supple. No thyromegaly present.  Cardiovascular: Normal rate, regular rhythm, normal heart sounds and intact distal pulses.  No murmur heard. Pulmonary/Chest: Effort normal and breath sounds normal. No respiratory distress. He has no wheezes.  Abdominal: Soft. Bowel sounds are normal. He exhibits no distension. There is no tenderness.  Musculoskeletal: Normal range of motion. He exhibits no edema or tenderness.  Neurological: He is alert and oriented to person, place, and time. He has normal reflexes. No cranial nerve deficit.  Skin: Skin is warm and dry. No rash noted. No erythema.  Psychiatric: He has a normal mood and affect. His behavior is normal. Judgment and thought content normal.  Vitals reviewed.     BP 108/68   Pulse 76   Temp (!) 97 F (36.1 C) (Oral)   Ht 5' 6"  (1.676 m)   Wt 186 lb 9.6 oz (84.6 kg)   BMI 30.12 kg/m  Assessment & Plan:  [name] comes in today with chief complaint of recheck diabetes   Diagnosis and orders addressed:  1. Benign prostatic hyperplasia with lower urinary tract symptoms, symptom  details unspecified - CMP14+EGFR - PSA, total and free  2. Annual physical exam - CMP14+EGFR - Lipid panel - Bayer DCA Hb A1c Waived - Microalbumin / creatinine urine ratio - VITAMIN D 25 Hydroxy (Vit-D Deficiency, Fractures) - PSA, total and free  3. Diabetic polyneuropathy associated with type 2 diabetes mellitus (HCC) - CMP14+EGFR  4. Vitamin D deficiency - CMP14+EGFR - VITAMIN D 25 Hydroxy (Vit-D Deficiency, Fractures)  5. Overweight (BMI 25.0-29.9) - CMP14+EGFR  6. Insomnia, unspecified type - CMP14+EGFR  7. Hypertension associated with diabetes ([place]) - CMP14+EGFR  8. Hyperlipidemia associated with type 2 diabetes mellitus (HCC) - CMP14+EGFR  9. Type 2 diabetes mellitus with hyperglycemia, without long-term current use of insulin (HCC) - CMP14+EGFR - Microalbumin / creatinine urine ratio  10. Colon cancer screening - Ambulatory referral to Gastroenterology   Labs pending Health Maintenance reviewed Diet and exercise encouraged  Follow up plan: 3 months    Evelina Dun, FNP

## 2018-02-11 NOTE — Patient Instructions (Signed)
Diabetes Mellitus and Nutrition When you have diabetes (diabetes mellitus), it is very important to have healthy eating habits because your blood sugar (glucose) levels are greatly affected by what you eat and drink. Eating healthy foods in the appropriate amounts, at about the same times every day, can help you:  Control your blood glucose.  Lower your risk of heart disease.  Improve your blood pressure.  Reach or maintain a healthy weight.  Every person with diabetes is different, and each person has different needs for a meal plan. Your health care provider may recommend that you work with a diet and nutrition specialist (dietitian) to make a meal plan that is best for you. Your meal plan may vary depending on factors such as:  The calories you need.  The medicines you take.  Your weight.  Your blood glucose, blood pressure, and cholesterol levels.  Your activity level.  Other health conditions you have, such as heart or kidney disease.  How do carbohydrates affect me? Carbohydrates affect your blood glucose level more than any other type of food. Eating carbohydrates naturally increases the amount of glucose in your blood. Carbohydrate counting is a method for keeping track of how many carbohydrates you eat. Counting carbohydrates is important to keep your blood glucose at a healthy level, especially if you use insulin or take certain oral diabetes medicines. It is important to know how many carbohydrates you can safely have in each meal. This is different for every person. Your dietitian can help you calculate how many carbohydrates you should have at each meal and for snack. Foods that contain carbohydrates include:  Bread, cereal, rice, pasta, and crackers.  Potatoes and corn.  Peas, beans, and lentils.  Milk and yogurt.  Fruit and juice.  Desserts, such as cakes, cookies, ice cream, and candy.  How does alcohol affect me? Alcohol can cause a sudden decrease in blood  glucose (hypoglycemia), especially if you use insulin or take certain oral diabetes medicines. Hypoglycemia can be a life-threatening condition. Symptoms of hypoglycemia (sleepiness, dizziness, and confusion) are similar to symptoms of having too much alcohol. If your health care provider says that alcohol is safe for you, follow these guidelines:  Limit alcohol intake to no more than 1 drink per day for nonpregnant women and 2 drinks per day for men. One drink equals 12 oz of beer, 5 oz of wine, or 1 oz of hard liquor.  Do not drink on an empty stomach.  Keep yourself hydrated with water, diet soda, or unsweetened iced tea.  Keep in mind that regular soda, juice, and other mixers may contain a lot of sugar and must be counted as carbohydrates.  What are tips for following this plan? Reading food labels  Start by checking the serving size on the label. The amount of calories, carbohydrates, fats, and other nutrients listed on the label are based on one serving of the food. Many foods contain more than one serving per package.  Check the total grams (g) of carbohydrates in one serving. You can calculate the number of servings of carbohydrates in one serving by dividing the total carbohydrates by 15. For example, if a food has 30 g of total carbohydrates, it would be equal to 2 servings of carbohydrates.  Check the number of grams (g) of saturated and trans fats in one serving. Choose foods that have low or no amount of these fats.  Check the number of milligrams (mg) of sodium in one serving. Most people   should limit total sodium intake to less than 2,300 mg per day.  Always check the nutrition information of foods labeled as "low-fat" or "nonfat". These foods may be higher in added sugar or refined carbohydrates and should be avoided.  Talk to your dietitian to identify your daily goals for nutrients listed on the label. Shopping  Avoid buying canned, premade, or processed foods. These  foods tend to be high in fat, sodium, and added sugar.  Shop around the outside edge of the grocery store. This includes fresh fruits and vegetables, bulk grains, fresh meats, and fresh dairy. Cooking  Use low-heat cooking methods, such as baking, instead of high-heat cooking methods like deep frying.  Cook using healthy oils, such as olive, canola, or sunflower oil.  Avoid cooking with butter, cream, or high-fat meats. Meal planning  Eat meals and snacks regularly, preferably at the same times every day. Avoid going long periods of time without eating.  Eat foods high in fiber, such as fresh fruits, vegetables, beans, and whole grains. Talk to your dietitian about how many servings of carbohydrates you can eat at each meal.  Eat 4-6 ounces of lean protein each day, such as lean meat, chicken, fish, eggs, or tofu. 1 ounce is equal to 1 ounce of meat, chicken, or fish, 1 egg, or 1/4 cup of tofu.  Eat some foods each day that contain healthy fats, such as avocado, nuts, seeds, and fish. Lifestyle   Check your blood glucose regularly.  Exercise at least 30 minutes 5 or more days each week, or as told by your health care provider.  Take medicines as told by your health care provider.  Do not use any products that contain nicotine or tobacco, such as cigarettes and e-cigarettes. If you need help quitting, ask your health care provider.  Work with a counselor or diabetes educator to identify strategies to manage stress and any emotional and social challenges. What are some questions to ask my health care provider?  Do I need to meet with a diabetes educator?  Do I need to meet with a dietitian?  What number can I call if I have questions?  When are the best times to check my blood glucose? Where to find more information:  American Diabetes Association: diabetes.org/food-and-fitness/food  Academy of Nutrition and Dietetics:  www.eatright.org/resources/health/diseases-and-conditions/diabetes  National Institute of Diabetes and Digestive and Kidney Diseases (NIH): www.niddk.nih.gov/health-information/diabetes/overview/diet-eating-physical-activity Summary  A healthy meal plan will help you control your blood glucose and maintain a healthy lifestyle.  Working with a diet and nutrition specialist (dietitian) can help you make a meal plan that is best for you.  Keep in mind that carbohydrates and alcohol have immediate effects on your blood glucose levels. It is important to count carbohydrates and to use alcohol carefully. This information is not intended to replace advice given to you by your health care provider. Make sure you discuss any questions you have with your health care provider. Document Released: 04/10/2005 Document Revised: 08/18/2016 Document Reviewed: 08/18/2016 Elsevier Interactive Patient Education  2018 Elsevier Inc.  

## 2018-02-12 LAB — LIPID PANEL
CHOL/HDL RATIO: 3.6 ratio (ref 0.0–5.0)
Cholesterol, Total: 131 mg/dL (ref 100–199)
HDL: 36 mg/dL — AB (ref >39)
LDL CALC: 69 mg/dL (ref 0–99)
Triglycerides: 129 mg/dL (ref 0–149)
VLDL CHOLESTEROL CAL: 26 mg/dL (ref 5–40)

## 2018-02-12 LAB — CMP14+EGFR
ALBUMIN: 4.6 g/dL (ref 3.6–4.8)
ALT: 22 IU/L (ref 0–44)
AST: 16 IU/L (ref 0–40)
Albumin/Globulin Ratio: 2.1 (ref 1.2–2.2)
Alkaline Phosphatase: 48 IU/L (ref 39–117)
BILIRUBIN TOTAL: 0.3 mg/dL (ref 0.0–1.2)
BUN / CREAT RATIO: 10 (ref 10–24)
BUN: 9 mg/dL (ref 8–27)
CALCIUM: 9.5 mg/dL (ref 8.6–10.2)
CHLORIDE: 104 mmol/L (ref 96–106)
CO2: 23 mmol/L (ref 20–29)
CREATININE: 0.86 mg/dL (ref 0.76–1.27)
GFR calc non Af Amer: 90 mL/min/{1.73_m2} (ref >59)
GFR, EST AFRICAN AMERICAN: 104 mL/min/{1.73_m2} (ref >59)
GLUCOSE: 121 mg/dL — AB (ref 65–99)
Globulin, Total: 2.2 g/dL (ref 1.5–4.5)
Potassium: 4.5 mmol/L (ref 3.5–5.2)
Sodium: 144 mmol/L (ref 134–144)
TOTAL PROTEIN: 6.8 g/dL (ref 6.0–8.5)

## 2018-02-12 LAB — MICROALBUMIN / CREATININE URINE RATIO
Creatinine, Urine: 252 mg/dL
MICROALBUM., U, RANDOM: 21.2 ug/mL
Microalb/Creat Ratio: 8.4 mg/g creat (ref 0.0–30.0)

## 2018-02-12 LAB — VITAMIN D 25 HYDROXY (VIT D DEFICIENCY, FRACTURES): Vit D, 25-Hydroxy: 54.1 ng/mL (ref 30.0–100.0)

## 2018-02-13 LAB — PSA, TOTAL AND FREE
PSA FREE PCT: 9.6 %
PSA, Free: 0.23 ng/mL
Prostate Specific Ag, Serum: 2.4 ng/mL (ref 0.0–4.0)

## 2018-02-13 LAB — SPECIMEN STATUS REPORT

## 2018-02-26 ENCOUNTER — Other Ambulatory Visit: Payer: Self-pay | Admitting: Family

## 2018-02-26 DIAGNOSIS — I10 Essential (primary) hypertension: Secondary | ICD-10-CM

## 2018-03-02 ENCOUNTER — Ambulatory Visit (INDEPENDENT_AMBULATORY_CARE_PROVIDER_SITE_OTHER): Payer: Medicare HMO

## 2018-03-02 VITALS — BP 137/84 | HR 82 | Temp 97.0°F | Ht 66.0 in | Wt 188.0 lb

## 2018-03-02 DIAGNOSIS — Z0000 Encounter for general adult medical examination without abnormal findings: Secondary | ICD-10-CM

## 2018-03-02 NOTE — Patient Instructions (Signed)
  Mr. Joelene MillinOliver , Thank you for taking time to come for your Medicare Wellness Visit. I appreciate your ongoing commitment to your health goals. Please review the following plan we discussed and let me know if I can assist you in the future.   These are the goals we discussed: Goals    . DIET - DECREASE SODA OR JUICE INTAKE    . Exercise 150 min/wk Moderate Activity       This is a list of the screening recommended for you and due dates:  Health Maintenance  Topic Date Due  . Eye exam for diabetics  02/19/2018  . Flu Shot  02/25/2018  . Complete foot exam   04/30/2018  . Hemoglobin A1C  08/14/2018  . Colon Cancer Screening  01/31/2021  . Tetanus Vaccine  04/30/2027  .  Hepatitis C: One time screening is recommended by Center for Disease Control  (CDC) for  adults born from [birth year] through 1965.   Completed  . Pneumonia vaccines  Completed    Things for you to do:  Fill out paperwork for Dr. Patty Sermonsehman's office and send back to them.  Once they receive your paperwork they will call you to schedule an appointment for your colonoscopy.  You have an appointment on 03/16/18 at 9:20 am [facility] [place].

## 2018-03-02 NOTE — Progress Notes (Signed)
Subjective:   Barry Smith Sr. is a 67 y.o. male who presents for an Initial Medicare Annual Wellness Visit.  Barry Smith retired at age 13 after working many years at [employer].  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  He is married with two grown children and three grandchildren.    Review of Systems   Cardiac Risk Factors include: advanced age (>34men, >77 women);diabetes mellitus;dyslipidemia;hypertension;male gender;sedentary lifestyle    Objective:    Today's Vitals   03/02/18 0839  BP: 137/84  Pulse: 82  Temp: (!) 97 F (36.1 C)  TempSrc: Oral  Weight: 188 lb (85.3 kg)  Height: 5\' 6"  (1.676 m)   Body mass index is 30.34 kg/m.  Advanced Directives 03/02/2018 09/24/2017  Does Patient Have a Medical Advance Directive? No No  Would patient like information on creating a medical advance directive? No - Patient declined -    Current Medications (verified) Outpatient Encounter Medications as of 03/02/2018  Medication Sig  . ACCU-CHEK AVIVA PLUS test strip TEST BLOOD SUGAR FOUR TIMES DAILY FOR DIABETES  . ACCU-CHEK SOFTCLIX LANCETS lancets TEST BLOOD SUGAR FOUR TIMES DAILY  . aspirin 81 MG tablet Take 81 mg by mouth daily.  . Blood Glucose Monitoring Suppl (ACCU-CHEK AVIVA) device Use as instructed  . cholecalciferol (VITAMIN D) 1000 UNITS tablet Take 1 tablet (1,000 Units total) by mouth daily.  . diclofenac (VOLTAREN) 75 MG EC tablet Take 1 tablet (75 mg total) by mouth 2 (two) times daily.  [name] gabapentin (NEURONTIN) 300 MG capsule   . glimepiride (AMARYL) 2 MG tablet TAKE 1 TABLET EVERY MORNING  . losartan (COZAAR) 100 MG tablet TAKE 1 TABLET EVERY DAY  . metFORMIN (GLUCOPHAGE) 500 MG tablet TAKE 2 TABLETS EVERY MORNING AND TAKE 3 TABLETS AT BEDTIME  . Multiple Vitamin (MULTIVITAMIN) capsule Take 1 capsule by mouth daily.  . Omega-3 Fatty Acids (FISH OIL) 1200 MG CAPS Take 2 capsules by mouth daily.   . simvastatin (ZOCOR) 20 MG tablet TAKE 1  TABLET AT BEDTIME  . sitaGLIPtin (JANUVIA) 50 MG tablet Take 1 tablet (50 mg total) by mouth daily.  . tamsulosin (FLOMAX) 0.4 MG CAPS capsule TAKE 1 CAPSULE EVERY DAY  . Vitamin D, Ergocalciferol, (DRISDOL) 50000 units CAPS capsule TAKE 1 CAPSULE EVERY WEEK   No facility-administered encounter medications on file as of 03/02/2018.     Allergies (verified) Patient has no known allergies.   History: Past Medical History:  Diagnosis Date  . Anxiety   . Diabetes mellitus without complication (HCC)   . Hyperlipidemia   . Hypertension    Past Surgical History:  Procedure Laterality Date  . APPENDECTOMY     Family History  Problem Relation Age of Onset  . Diabetes Father   . Diabetes Sister   . Pneumonia Brother   . Diabetes Sister   . Diabetes Sister   . Diabetes Sister   . Diabetes Sister   . Diabetes Sister   . Cancer Sister   . Diabetes Brother   . Diabetes Brother   . Diabetes Brother   . Diabetes Brother   . Diabetes Brother    Social History   Socioeconomic History  . Marital status: Single    Spouse name: Not on file  . Number of children: Not on file  . Years of education: Not on file  . Highest education level: Not on file  Occupational History  . Not on file  Social  Needs  . Financial resource strain: Not hard at all  . Food insecurity:    Worry: Never true    Inability: Never true  . Transportation needs:    Medical: No    Non-medical: No  Tobacco Use  . Smoking status: Never Smoker  . Smokeless tobacco: Never Used  Substance and Sexual Activity  . Alcohol use: No  . Drug use: No  . Sexual activity: Yes  Lifestyle  . Physical activity:    Days per week: 0 days    Minutes per session: 0 min  . Stress: Not at all  Relationships  . Social connections:    Talks on phone: More than three times a week    Gets together: Once a week    Attends religious service: Never    Active member of club or organization: No    Attends meetings of clubs or  organizations: Never    Relationship status: Married  Other Topics Concern  . Not on file  Social History Narrative  . Not on file   Tobacco Counseling Patient is a non-smoker.   Activities of Daily Living In your present state of health, do you have any difficulty performing the following activities: 03/02/2018  Hearing? N  Vision? N  Difficulty concentrating or making decisions? N  Walking or climbing stairs? N  Dressing or bathing? N  Doing errands, shopping? N  Preparing Food and eating ? N  Using the Toilet? N  In the past six months, have you accidently leaked urine? N  Do you have problems with loss of bowel control? N  Managing your Medications? N  Managing your Finances? N  Housekeeping or managing your Housekeeping? N  Some recent data might be hidden     Immunizations and Health Maintenance Immunization History  Administered Date(s) Administered  . Influenza, High Dose Seasonal PF 05/20/2016, 05/26/2017  . Influenza,inj,Quad PF,6+ Mos 05/27/2013, 08/10/2014, 05/17/2015  . Pneumococcal Conjugate-13 08/10/2014  . Pneumococcal Polysaccharide-23 01/22/2017  . Td 04/29/2017   Health Maintenance Due  Topic Date Due  . OPHTHALMOLOGY EXAM  02/19/2018  . INFLUENZA VACCINE  02/25/2018   While patient was here today, we scheduled him an appointment at [facility] [place] for March 16, 2018, at 9:20 am.  He also has received paperwork from his gastroenterologist office, Dr. Karilyn Cota, to fill out and send back to them so they may schedule him for a colonoscopy.    Patient Care Team: Junie Spencer, FNP as PCP - General (Family Medicine)   Indicate any recent Medical Services you may have received from other than [facility] providers in the past year (date may be approximate).    Assessment:   This is a routine wellness examination for Barry Smith.  Dietary issues and exercise activities discussed: Current Exercise Habits: The patient does not participate in regular exercise at  present   Patient was encouraged to include 30 minutes of walking three times per week in his normal weekly routine.  Goals    . DIET - DECREASE SODA OR JUICE INTAKE    . Exercise 150 min/wk Moderate Activity      Depression Screen PHQ 2/9 Scores 03/02/2018 02/11/2018 01/08/2018 12/30/2017  PHQ - 2 Score 0 0 0 0    Fall Risk Fall Risk  03/02/2018 02/11/2018 01/08/2018 12/30/2017 12/17/2017  Falls in the past year? No No No No No    Is the patient's home free of loose throw rugs in walkways, pet beds, electrical cords,  etc?   Yes      Grab bars in the bathroom? Yes      Handrails on the stairs?   Yes      Adequate lighting?   Yes   Cognitive Function: MMSE - Mini Mental State Exam 03/02/2018  Orientation to time 5  Orientation to Place 5  Registration 3  Attention/ Calculation 5  Recall 3  Language- name 2 objects 2  Language- repeat 1  Language- follow 3 step command 3  Language- read & follow direction 1  Write a sentence 1  Copy design 1  Total score 30      Patient performed well on his MMSE, scoring 30 out of 30 available points.  Screening Tests Health Maintenance  Topic Date Due  . OPHTHALMOLOGY EXAM  02/19/2018  . INFLUENZA VACCINE  02/25/2018  . FOOT EXAM  04/30/2018  . HEMOGLOBIN A1C  08/14/2018  . COLONOSCOPY  01/31/2021  . TETANUS/TDAP  04/30/2027  . Hepatitis C Screening  Completed  . PNA vac Low Risk Adult  Completed   Patient has received paperwork from his gastroenterologist office, Dr. Karilyn Cotaehman, to fill out and send back to them so they may schedule him for a colonoscopy.  Encouraged him today to go ahead and complete this.   Cancer Screenings: Lung: Low Dose CT Chest recommended if Age 55-80 years, 30 pack-year currently smoking OR have quit w/in 15years. Patient does not qualify. Colorectal: patient to complete paperwork to have scheduled      Plan:   Complete paperwork to schedule colonoscopy.  Eye exam on 03/16/18  I have personally reviewed and  noted the following in the patient's chart:   . Medical and social history . Use of alcohol, tobacco or illicit drugs  . Current medications and supplements . Functional ability and status . Nutritional status . Physical activity . Advanced directives . List of other physicians . Hospitalizations, surgeries, and ER visits in previous 12 months . Vitals . Screenings to include cognitive, depression, and falls . Referrals and appointments  In addition, I have reviewed and discussed with patient certain preventive protocols, quality metrics, and best practice recommendations. A written personalized care plan for preventive services as well as general preventive health recommendations were provided to patient.     Barry Smith, Barry Kersey, LPN   1/6/10968/12/2017    I have reviewed and agree with the above AWV documentation.   Jannifer Rodneyhristy Hawks, FNP

## 2018-03-16 LAB — HM DIABETES EYE EXAM

## 2018-03-22 ENCOUNTER — Other Ambulatory Visit: Payer: Self-pay | Admitting: Family

## 2018-03-22 NOTE — Telephone Encounter (Signed)
Last Vit D 54.1  02/11/18

## 2018-03-24 ENCOUNTER — Other Ambulatory Visit (INDEPENDENT_AMBULATORY_CARE_PROVIDER_SITE_OTHER): Payer: Self-pay | Admitting: *Deleted

## 2018-03-24 DIAGNOSIS — Z1211 Encounter for screening for malignant neoplasm of colon: Secondary | ICD-10-CM

## 2018-04-29 ENCOUNTER — Other Ambulatory Visit: Payer: Self-pay | Admitting: Family

## 2018-04-29 DIAGNOSIS — E785 Hyperlipidemia, unspecified: Secondary | ICD-10-CM

## 2018-04-29 DIAGNOSIS — E1165 Type 2 diabetes mellitus with hyperglycemia: Secondary | ICD-10-CM

## 2018-04-29 DIAGNOSIS — E1142 Type 2 diabetes mellitus with diabetic polyneuropathy: Secondary | ICD-10-CM

## 2018-05-19 ENCOUNTER — Ambulatory Visit (INDEPENDENT_AMBULATORY_CARE_PROVIDER_SITE_OTHER): Payer: Medicare HMO | Admitting: Family

## 2018-05-19 ENCOUNTER — Encounter: Payer: Self-pay | Admitting: Family

## 2018-05-19 VITALS — BP 122/71 | HR 74 | Temp 96.9°F | Ht 66.0 in | Wt 186.2 lb

## 2018-05-19 DIAGNOSIS — I10 Essential (primary) hypertension: Secondary | ICD-10-CM

## 2018-05-19 DIAGNOSIS — Z23 Encounter for immunization: Secondary | ICD-10-CM | POA: Diagnosis not present

## 2018-05-19 DIAGNOSIS — E1159 Type 2 diabetes mellitus with other circulatory complications: Secondary | ICD-10-CM | POA: Diagnosis not present

## 2018-05-19 DIAGNOSIS — E559 Vitamin D deficiency, unspecified: Secondary | ICD-10-CM

## 2018-05-19 DIAGNOSIS — E1165 Type 2 diabetes mellitus with hyperglycemia: Secondary | ICD-10-CM

## 2018-05-19 DIAGNOSIS — N401 Enlarged prostate with lower urinary tract symptoms: Secondary | ICD-10-CM

## 2018-05-19 DIAGNOSIS — E785 Hyperlipidemia, unspecified: Secondary | ICD-10-CM

## 2018-05-19 DIAGNOSIS — G4700 Insomnia, unspecified: Secondary | ICD-10-CM

## 2018-05-19 DIAGNOSIS — E1142 Type 2 diabetes mellitus with diabetic polyneuropathy: Secondary | ICD-10-CM

## 2018-05-19 DIAGNOSIS — E663 Overweight: Secondary | ICD-10-CM

## 2018-05-19 DIAGNOSIS — E1169 Type 2 diabetes mellitus with other specified complication: Secondary | ICD-10-CM

## 2018-05-19 LAB — LIPID PANEL
Chol/HDL Ratio: 3.4 ratio (ref 0.0–5.0)
Cholesterol, Total: 125 mg/dL (ref 100–199)
HDL: 37 mg/dL — AB (ref >39)
LDL Calculated: 72 mg/dL (ref 0–99)
TRIGLYCERIDES: 80 mg/dL (ref 0–149)
VLDL Cholesterol Cal: 16 mg/dL (ref 5–40)

## 2018-05-19 LAB — CMP14+EGFR
A/G RATIO: 2.1 (ref 1.2–2.2)
ALK PHOS: 40 IU/L (ref 39–117)
ALT: 27 IU/L (ref 0–44)
AST: 20 IU/L (ref 0–40)
Albumin: 4.5 g/dL (ref 3.6–4.8)
BILIRUBIN TOTAL: 0.2 mg/dL (ref 0.0–1.2)
BUN/Creatinine Ratio: 16 (ref 10–24)
BUN: 14 mg/dL (ref 8–27)
CHLORIDE: 106 mmol/L (ref 96–106)
CO2: 22 mmol/L (ref 20–29)
Calcium: 9.4 mg/dL (ref 8.6–10.2)
Creatinine, Ser: 0.85 mg/dL (ref 0.76–1.27)
GFR calc non Af Amer: 90 mL/min/{1.73_m2} (ref >59)
GFR, EST AFRICAN AMERICAN: 104 mL/min/{1.73_m2} (ref >59)
GLUCOSE: 104 mg/dL — AB (ref 65–99)
Globulin, Total: 2.1 g/dL (ref 1.5–4.5)
Potassium: 4.6 mmol/L (ref 3.5–5.2)
Sodium: 144 mmol/L (ref 134–144)
TOTAL PROTEIN: 6.6 g/dL (ref 6.0–8.5)

## 2018-05-19 LAB — CBC WITH DIFFERENTIAL/PLATELET
Basophils Absolute: 0 10*3/uL (ref 0.0–0.2)
Basos: 1 %
EOS (ABSOLUTE): 0.1 10*3/uL (ref 0.0–0.4)
EOS: 3 %
Hematocrit: 40.6 % (ref 37.5–51.0)
Hemoglobin: 13.2 g/dL (ref 13.0–17.7)
IMMATURE GRANULOCYTES: 0 %
Immature Grans (Abs): 0 10*3/uL (ref 0.0–0.1)
Lymphocytes Absolute: 1.4 10*3/uL (ref 0.7–3.1)
Lymphs: 38 %
MCH: 28.1 pg (ref 26.6–33.0)
MCHC: 32.5 g/dL (ref 31.5–35.7)
MCV: 86 fL (ref 79–97)
MONOS ABS: 0.3 10*3/uL (ref 0.1–0.9)
Monocytes: 9 %
NEUTROS PCT: 49 %
Neutrophils Absolute: 1.9 10*3/uL (ref 1.4–7.0)
PLATELETS: 321 10*3/uL (ref 150–450)
RBC: 4.7 x10E6/uL (ref 4.14–5.80)
RDW: 13.1 % (ref 12.3–15.4)
WBC: 3.8 10*3/uL (ref 3.4–10.8)

## 2018-05-19 LAB — BAYER DCA HB A1C WAIVED: HB A1C: 6.7 % (ref <7.0)

## 2018-05-19 NOTE — Progress Notes (Signed)
Subjective:    Patient ID: Barry Derry., male    DOB: [birth year], 67 y.o.   MRN: [id number]  Chief Complaint  Patient presents with  . Medical Management of Chronic Issues    three month recheck   Pt presents to the office today for chronic follow up.  Diabetes  He presents for his follow-up diabetic visit. He has type 2 diabetes mellitus. His disease course has been stable. There are no hypoglycemic associated symptoms. Pertinent negatives for diabetes include no blurred vision, no foot ulcerations, no visual change and no weight loss. There are no hypoglycemic complications. Symptoms are stable. Pertinent negatives for diabetic complications include no CVA, heart disease, nephropathy or peripheral neuropathy. Risk factors for coronary artery disease include family history, dyslipidemia, diabetes mellitus, male sex, hypertension and sedentary lifestyle. He is following a generally unhealthy diet. His overall blood glucose range is 140-180 mg/dl. Eye exam is current.  Hypertension  This is a chronic problem. The current episode started more than 1 year ago. The problem has been waxing and waning since onset. The problem is controlled. Pertinent negatives include no blurred vision, peripheral edema or shortness of breath. Risk factors for coronary artery disease include dyslipidemia, diabetes mellitus, obesity and male gender. There is no history of CAD/MI, CVA or heart failure.  Hyperlipidemia  This is a chronic problem. The current episode started more than 1 year ago. The problem is controlled. Recent lipid tests were reviewed and are normal. Exacerbating diseases include obesity. Pertinent negatives include no shortness of breath. Current antihyperlipidemic treatment includes statins. The current treatment provides moderate improvement of lipids. Risk factors for coronary artery disease include dyslipidemia, diabetes mellitus, male sex, hypertension and a sedentary lifestyle.  Benign  Prostatic Hypertrophy  This is a chronic problem. The current episode started more than 1 year ago. The problem has been waxing and waning since onset. Irritative symptoms include nocturia and urgency.  Insomnia  Primary symptoms: no difficulty falling asleep, no frequent awakening.  The current episode started more than one year. The onset quality is gradual. The problem occurs intermittently.  Diabetic Neuropathy Pt currently taking Gabapentin. States his pain is now a 0 out 10.     Review of Systems  Constitutional: Negative for weight loss.  Eyes: Negative for blurred vision.  Respiratory: Negative for shortness of breath.   Genitourinary: Positive for nocturia and urgency.  Psychiatric/Behavioral: The patient has insomnia.   All other systems reviewed and are negative.      Objective:   Physical Exam  Constitutional: He is oriented to person, place, and time. He appears well-developed and well-nourished. No distress.  HENT:  Head: Normocephalic.  Right Ear: External ear normal.  Left Ear: External ear normal.  Mouth/Throat: Oropharynx is clear and moist.  Eyes: Pupils are equal, round, and reactive to light. Right eye exhibits no discharge. Left eye exhibits no discharge.  Neck: Normal range of motion. Neck supple. No thyromegaly present.  Cardiovascular: Normal rate, regular rhythm, normal heart sounds and intact distal pulses.  No murmur heard. Pulmonary/Chest: Effort normal and breath sounds normal. No respiratory distress. He has no wheezes.  Abdominal: Soft. Bowel sounds are normal. He exhibits no distension. There is no tenderness.  Musculoskeletal: Normal range of motion. He exhibits no edema or tenderness.  Neurological: He is alert and oriented to person, place, and time. He has normal reflexes. No cranial nerve deficit.  Skin: Skin is warm and dry. No rash noted. No erythema.  Psychiatric:  He has a normal mood and affect. His behavior is normal. Judgment and  thought content normal.  Vitals reviewed.     BP 122/71   Pulse 74   Temp (!) 96.9 F (36.1 C) (Oral)   Ht 5' 6"  (1.676 m)   Wt 186 lb 3.2 oz (84.5 kg)   BMI 30.05 kg/m      Assessment & Plan:  Barry Doe Sr. comes in today with chief complaint of Medical Management of Chronic Issues (three month recheck)   Diagnosis and orders addressed:  1. Encounter for immunization - Flu vaccine HIGH DOSE PF  2. Hypertension associated with diabetes ([place]) - CMP14+EGFR - CBC with Differential/Platelet  3. Type 2 diabetes mellitus with hyperglycemia, without long-term current use of insulin (HCC) - Bayer DCA Hb A1c Waived - CMP14+EGFR - CBC with Differential/Platelet  4. Insomnia, unspecified type - CMP14+EGFR - CBC with Differential/Platelet  5. Overweight (BMI 25.0-29.9) - CMP14+EGFR - CBC with Differential/Platelet  6. Vitamin D deficiency  - CMP14+EGFR - CBC with Differential/Platelet  7. Benign prostatic hyperplasia with lower urinary tract symptoms, symptom details unspecified  - CMP14+EGFR - CBC with Differential/Platelet  8. Diabetic polyneuropathy associated with type 2 diabetes mellitus (HCC) - CMP14+EGFR - CBC with Differential/Platelet  9. Hyperlipidemia associated with type 2 diabetes mellitus ([place]) - CMP14+EGFR - CBC with Differential/Platelet - Lipid panel   Labs pending Health Maintenance reviewed Diet and exercise encouraged  Follow up plan: 4 months  Evelina Dun, FNP

## 2018-05-19 NOTE — Patient Instructions (Signed)
Diabetes Mellitus and Nutrition When you have diabetes (diabetes mellitus), it is very important to have healthy eating habits because your blood sugar (glucose) levels are greatly affected by what you eat and drink. Eating healthy foods in the appropriate amounts, at about the same times every day, can help you:  Control your blood glucose.  Lower your risk of heart disease.  Improve your blood pressure.  Reach or maintain a healthy weight.  Every person with diabetes is different, and each person has different needs for a meal plan. Your health care provider may recommend that you work with a diet and nutrition specialist (dietitian) to make a meal plan that is best for you. Your meal plan may vary depending on factors such as:  The calories you need.  The medicines you take.  Your weight.  Your blood glucose, blood pressure, and cholesterol levels.  Your activity level.  Other health conditions you have, such as heart or kidney disease.  How do carbohydrates affect me? Carbohydrates affect your blood glucose level more than any other type of food. Eating carbohydrates naturally increases the amount of glucose in your blood. Carbohydrate counting is a method for keeping track of how many carbohydrates you eat. Counting carbohydrates is important to keep your blood glucose at a healthy level, especially if you use insulin or take certain oral diabetes medicines. It is important to know how many carbohydrates you can safely have in each meal. This is different for every person. Your dietitian can help you calculate how many carbohydrates you should have at each meal and for snack. Foods that contain carbohydrates include:  Bread, cereal, rice, pasta, and crackers.  Potatoes and corn.  Peas, beans, and lentils.  Milk and yogurt.  Fruit and juice.  Desserts, such as cakes, cookies, ice cream, and candy.  How does alcohol affect me? Alcohol can cause a sudden decrease in blood  glucose (hypoglycemia), especially if you use insulin or take certain oral diabetes medicines. Hypoglycemia can be a life-threatening condition. Symptoms of hypoglycemia (sleepiness, dizziness, and confusion) are similar to symptoms of having too much alcohol. If your health care provider says that alcohol is safe for you, follow these guidelines:  Limit alcohol intake to no more than 1 drink per day for nonpregnant women and 2 drinks per day for men. One drink equals 12 oz of beer, 5 oz of wine, or 1 oz of hard liquor.  Do not drink on an empty stomach.  Keep yourself hydrated with water, diet soda, or unsweetened iced tea.  Keep in mind that regular soda, juice, and other mixers may contain a lot of sugar and must be counted as carbohydrates.  What are tips for following this plan? Reading food labels  Start by checking the serving size on the label. The amount of calories, carbohydrates, fats, and other nutrients listed on the label are based on one serving of the food. Many foods contain more than one serving per package.  Check the total grams (g) of carbohydrates in one serving. You can calculate the number of servings of carbohydrates in one serving by dividing the total carbohydrates by 15. For example, if a food has 30 g of total carbohydrates, it would be equal to 2 servings of carbohydrates.  Check the number of grams (g) of saturated and trans fats in one serving. Choose foods that have low or no amount of these fats.  Check the number of milligrams (mg) of sodium in one serving. Most people   should limit total sodium intake to less than 2,300 mg per day.  Always check the nutrition information of foods labeled as "low-fat" or "nonfat". These foods may be higher in added sugar or refined carbohydrates and should be avoided.  Talk to your dietitian to identify your daily goals for nutrients listed on the label. Shopping  Avoid buying canned, premade, or processed foods. These  foods tend to be high in fat, sodium, and added sugar.  Shop around the outside edge of the grocery store. This includes fresh fruits and vegetables, bulk grains, fresh meats, and fresh dairy. Cooking  Use low-heat cooking methods, such as baking, instead of high-heat cooking methods like deep frying.  Cook using healthy oils, such as olive, canola, or sunflower oil.  Avoid cooking with butter, cream, or high-fat meats. Meal planning  Eat meals and snacks regularly, preferably at the same times every day. Avoid going long periods of time without eating.  Eat foods high in fiber, such as fresh fruits, vegetables, beans, and whole grains. Talk to your dietitian about how many servings of carbohydrates you can eat at each meal.  Eat 4-6 ounces of lean protein each day, such as lean meat, chicken, fish, eggs, or tofu. 1 ounce is equal to 1 ounce of meat, chicken, or fish, 1 egg, or 1/4 cup of tofu.  Eat some foods each day that contain healthy fats, such as avocado, nuts, seeds, and fish. Lifestyle   Check your blood glucose regularly.  Exercise at least 30 minutes 5 or more days each week, or as told by your health care provider.  Take medicines as told by your health care provider.  Do not use any products that contain nicotine or tobacco, such as cigarettes and e-cigarettes. If you need help quitting, ask your health care provider.  Work with a counselor or diabetes educator to identify strategies to manage stress and any emotional and social challenges. What are some questions to ask my health care provider?  Do I need to meet with a diabetes educator?  Do I need to meet with a dietitian?  What number can I call if I have questions?  When are the best times to check my blood glucose? Where to find more information:  American Diabetes Association: diabetes.org/food-and-fitness/food  Academy of Nutrition and Dietetics:  www.eatright.org/resources/health/diseases-and-conditions/diabetes  National Institute of Diabetes and Digestive and Kidney Diseases (NIH): www.niddk.nih.gov/health-information/diabetes/overview/diet-eating-physical-activity Summary  A healthy meal plan will help you control your blood glucose and maintain a healthy lifestyle.  Working with a diet and nutrition specialist (dietitian) can help you make a meal plan that is best for you.  Keep in mind that carbohydrates and alcohol have immediate effects on your blood glucose levels. It is important to count carbohydrates and to use alcohol carefully. This information is not intended to replace advice given to you by your health care provider. Make sure you discuss any questions you have with your health care provider. Document Released: 04/10/2005 Document Revised: 08/18/2016 Document Reviewed: 08/18/2016 Elsevier Interactive Patient Education  2018 Elsevier Inc.  

## 2018-05-27 ENCOUNTER — Telehealth: Payer: Self-pay | Admitting: Family

## 2018-05-27 ENCOUNTER — Other Ambulatory Visit: Payer: Self-pay | Admitting: *Deleted

## 2018-05-27 DIAGNOSIS — N401 Enlarged prostate with lower urinary tract symptoms: Secondary | ICD-10-CM

## 2018-05-27 MED ORDER — TAMSULOSIN HCL 0.4 MG PO CAPS: ORAL_CAPSULE | ORAL | 1 refills | Status: DC

## 2018-05-27 NOTE — Telephone Encounter (Signed)
Aware.  Script refilled.

## 2018-05-27 NOTE — Telephone Encounter (Signed)
What is the name of the medication? tamsulosin (FLOMAX) 0.4 MG CAPS capsule  Have you contacted your pharmacy to request a refill? no  Which pharmacy would you like this sent to? [facility] mail order   Patient notified that their request is being sent to the clinical staff for review and that they should receive a call once it is complete. If they do not receive a call within 24 hours they can check with their pharmacy or our office.

## 2018-05-31 ENCOUNTER — Telehealth (INDEPENDENT_AMBULATORY_CARE_PROVIDER_SITE_OTHER): Payer: Self-pay | Admitting: *Deleted

## 2018-05-31 ENCOUNTER — Encounter (INDEPENDENT_AMBULATORY_CARE_PROVIDER_SITE_OTHER): Payer: Self-pay | Admitting: *Deleted

## 2018-05-31 MED ORDER — SUPREP BOWEL PREP KIT 17.5-3.13-1.6 GM/177ML PO SOLN: 1.0000 | Freq: Once | ORAL | 0 refills | Status: AC

## 2018-05-31 NOTE — Telephone Encounter (Signed)
Patient needs suprep 

## 2018-05-31 NOTE — Telephone Encounter (Signed)
agree

## 2018-05-31 NOTE — Telephone Encounter (Signed)
Referring MD/PCP: [employer] , np -- wrfm   Procedure: tcs  Reason/Indication:  screening  Has patient had this procedure before?  no  If so, when, by whom and where?    Is there a family history of colon cancer?  no  Who?  What age when diagnosed?    Is patient diabetic?   yes      Does patient have prosthetic heart valve or mechanical valve?  no  Do you have a pacemaker?  no  Has patient ever had endocarditis? no  Has patient had joint replacement within last 12 months?  no  Is patient constipated or do they take laxatives? no  Does patient have a history of alcohol/drug use?  no  Is patient on blood thinner such as Coumadin, Plavix and/or Aspirin? yes  Medications: asa 81 mg daily, fish oil 1200 mg daily, gabapentin 300 mg bid, metformin 500 mg 5 tab 2 times a day, simvastatin 20 mg daily, tamsulosin 0.4 mg daily, januvia 50 mg daily, glimepiride 2 mg daily, losartan 100 mg daily, vit d 5000 units 1 a week, diclofenac 75 mg prn, melatonin 5 mg prn  Allergies: nkda  Medication Adjustment per Dr Keane Police, NP: asa 2 days, hold [place] & glimepiride days before, don't take metformin evening before and morning of   Procedure date & time:  06/30/18 at 830

## 2018-06-21 ENCOUNTER — Other Ambulatory Visit: Payer: Self-pay | Admitting: Family

## 2018-06-30 ENCOUNTER — Ambulatory Visit (HOSPITAL_COMMUNITY): Admit: 2018-06-30 | Payer: Medicare HMO | Admitting: Internal Medicine

## 2018-06-30 ENCOUNTER — Encounter (HOSPITAL_COMMUNITY): Payer: Self-pay

## 2018-06-30 SURGERY — COLONOSCOPY
Anesthesia: Moderate Sedation

## 2018-07-03 ENCOUNTER — Other Ambulatory Visit: Payer: Self-pay | Admitting: Family

## 2018-07-03 DIAGNOSIS — E1142 Type 2 diabetes mellitus with diabetic polyneuropathy: Secondary | ICD-10-CM

## 2018-07-03 DIAGNOSIS — E785 Hyperlipidemia, unspecified: Secondary | ICD-10-CM

## 2018-07-03 DIAGNOSIS — I10 Essential (primary) hypertension: Secondary | ICD-10-CM

## 2018-07-03 DIAGNOSIS — E1165 Type 2 diabetes mellitus with hyperglycemia: Secondary | ICD-10-CM

## 2018-07-05 NOTE — Telephone Encounter (Signed)
OV 07/04/19 

## 2018-07-12 ENCOUNTER — Other Ambulatory Visit: Payer: Self-pay | Admitting: Family

## 2018-08-23 ENCOUNTER — Ambulatory Visit (INDEPENDENT_AMBULATORY_CARE_PROVIDER_SITE_OTHER): Payer: Medicare HMO | Admitting: Family

## 2018-08-23 ENCOUNTER — Encounter: Payer: Self-pay | Admitting: Family

## 2018-08-23 VITALS — BP 139/76 | HR 78 | Temp 96.7°F | Ht 66.0 in | Wt 187.2 lb

## 2018-08-23 DIAGNOSIS — N401 Enlarged prostate with lower urinary tract symptoms: Secondary | ICD-10-CM

## 2018-08-23 DIAGNOSIS — E559 Vitamin D deficiency, unspecified: Secondary | ICD-10-CM

## 2018-08-23 DIAGNOSIS — G4700 Insomnia, unspecified: Secondary | ICD-10-CM

## 2018-08-23 DIAGNOSIS — E1159 Type 2 diabetes mellitus with other circulatory complications: Secondary | ICD-10-CM

## 2018-08-23 DIAGNOSIS — I10 Essential (primary) hypertension: Secondary | ICD-10-CM

## 2018-08-23 DIAGNOSIS — E663 Overweight: Secondary | ICD-10-CM

## 2018-08-23 DIAGNOSIS — E1169 Type 2 diabetes mellitus with other specified complication: Secondary | ICD-10-CM | POA: Diagnosis not present

## 2018-08-23 DIAGNOSIS — E1142 Type 2 diabetes mellitus with diabetic polyneuropathy: Secondary | ICD-10-CM

## 2018-08-23 DIAGNOSIS — E1165 Type 2 diabetes mellitus with hyperglycemia: Secondary | ICD-10-CM | POA: Diagnosis not present

## 2018-08-23 DIAGNOSIS — E785 Hyperlipidemia, unspecified: Secondary | ICD-10-CM

## 2018-08-23 LAB — BAYER DCA HB A1C WAIVED: HB A1C (BAYER DCA - WAIVED): 6.6 % (ref <7.0)

## 2018-08-23 NOTE — Progress Notes (Signed)
Subjective:    Patient ID: Barry Doe Sr., adult    DOB: 29-Dec-1950, 68 y.o.   MRN: [id number]  Chief Complaint  Patient presents with  . Medical Management of Chronic Issues    Hypertension  This is a chronic problem. The current episode started more than 1 year ago. The problem has been resolved since onset. The problem is controlled. Pertinent negatives include no blurred vision, headaches, peripheral edema or shortness of breath. Risk factors for coronary artery disease include dyslipidemia, diabetes mellitus and obesity. The current treatment provides moderate improvement. There is no history of kidney disease or CVA.  Hyperlipidemia  This is a chronic problem. The current episode started more than 1 year ago. The problem is controlled. Recent lipid tests were reviewed and are normal. Pertinent negatives include no shortness of breath. Current antihyperlipidemic treatment includes statins. The current treatment provides moderate improvement of lipids. Risk factors for coronary artery disease include diabetes mellitus, hypertension and a sedentary lifestyle.  Diabetes  She presents for her follow-up diabetic visit. She has type 2 diabetes mellitus. Her disease course has been stable. There are no hypoglycemic associated symptoms. Pertinent negatives for hypoglycemia include no headaches. Associated symptoms include foot paresthesias. Pertinent negatives for diabetes include no blurred vision. There are no hypoglycemic complications. Symptoms are stable. Diabetic complications include nephropathy. Pertinent negatives for diabetic complications include no CVA or heart disease. Risk factors for coronary artery disease include dyslipidemia, diabetes mellitus, male sex and hypertension. (Does not check blood sugars at home)  Insomnia  Primary symptoms: difficulty falling asleep.  The current episode started more than one year. The onset quality is gradual.  Diabetic Neuropathy  PT  currently taking gabapentin 300 mg BID. States this helps greatly and reports his pain is 0 out 10.     Review of Systems  Eyes: Negative for blurred vision.  Respiratory: Negative for shortness of breath.   Neurological: Negative for headaches.  Psychiatric/Behavioral: The patient has insomnia.   All other systems reviewed and are negative.      Objective:   Physical Exam Vitals signs reviewed.  Constitutional:      General: She is not in acute distress.    Appearance: She is well-developed.  HENT:     Head: Normocephalic and atraumatic.     Right Ear: Tympanic membrane normal.     Left Ear: Tympanic membrane normal.  Eyes:     Pupils: Pupils are equal, round, and reactive to light.  Neck:     Musculoskeletal: Normal range of motion and neck supple.     Thyroid: No thyromegaly.  Cardiovascular:     Rate and Rhythm: Normal rate and regular rhythm.     Heart sounds: Normal heart sounds. No murmur.  Pulmonary:     Effort: Pulmonary effort is normal. No respiratory distress.     Breath sounds: Normal breath sounds. No wheezing.  Abdominal:     General: Bowel sounds are normal. There is no distension.     Palpations: Abdomen is soft.     Tenderness: There is no abdominal tenderness.  Musculoskeletal: Normal range of motion.        General: No tenderness.  Skin:    General: Skin is warm and dry.  Neurological:     Mental Status: She is alert and oriented to person, place, and time.     Cranial Nerves: No cranial nerve deficit.     Deep Tendon Reflexes: Reflexes are normal and symmetric.  Psychiatric:  Behavior: Behavior normal.        Thought Content: Thought content normal.        Judgment: Judgment normal.     BP 139/76   Pulse 78   Temp (!) 96.7 F (35.9 C) (Oral)   Ht 5' 6"  (1.676 m)   Wt 187 lb 3.2 oz (84.9 kg)   BMI 30.21 kg/m      Assessment & Plan:  Barry Doe Sr. comes in today with chief complaint of Medical Management of Chronic  Issues   Diagnosis and orders addressed:  1. Hypertension associated with diabetes ([place]) - CMP14+EGFR - CBC with Differential/Platelet  2. Type 2 diabetes mellitus with hyperglycemia, without long-term current use of insulin (HCC) - Bayer DCA Hb A1c Waived - CMP14+EGFR - CBC with Differential/Platelet  3. Hyperlipidemia associated with type 2 diabetes mellitus (HCC) - CMP14+EGFR - CBC with Differential/Platelet  4. Insomnia, unspecified type - CMP14+EGFR - CBC with Differential/Platelet  5. Overweight (BMI 25.0-29.9) - CMP14+EGFR - CBC with Differential/Platelet  6. Vitamin D deficiency - CMP14+EGFR - CBC with Differential/Platelet  7. Benign prostatic hyperplasia with lower urinary tract symptoms, symptom details unspecified - CMP14+EGFR - CBC with Differential/Platelet  8. Diabetic polyneuropathy associated with type 2 diabetes mellitus ([place]) - CMP14+EGFR - CBC with Differential/Platelet   Labs pending Health Maintenance reviewed Diet and exercise encouraged  Follow up plan: 4 months    Evelina Dun, FNP

## 2018-08-23 NOTE — Patient Instructions (Signed)
Diabetes Mellitus and Nutrition, Adult  When you have diabetes (diabetes mellitus), it is very important to have healthy eating habits because your blood sugar (glucose) levels are greatly affected by what you eat and drink. Eating healthy foods in the appropriate amounts, at about the same times every day, can help you:  · Control your blood glucose.  · Lower your risk of heart disease.  · Improve your blood pressure.  · Reach or maintain a healthy weight.  Every person with diabetes is different, and each person has different needs for a meal plan. Your health care provider may recommend that you work with a diet and nutrition specialist (dietitian) to make a meal plan that is best for you. Your meal plan may vary depending on factors such as:  · The calories you need.  · The medicines you take.  · Your weight.  · Your blood glucose, blood pressure, and cholesterol levels.  · Your activity level.  · Other health conditions you have, such as heart or kidney disease.  How do carbohydrates affect me?  Carbohydrates, also called carbs, affect your blood glucose level more than any other type of food. Eating carbs naturally raises the amount of glucose in your blood. Carb counting is a method for keeping track of how many carbs you eat. Counting carbs is important to keep your blood glucose at a healthy level, especially if you use insulin or take certain oral diabetes medicines.  It is important to know how many carbs you can safely have in each meal. This is different for every person. Your dietitian can help you calculate how many carbs you should have at each meal and for each snack.  Foods that contain carbs include:  · Bread, cereal, rice, pasta, and crackers.  · Potatoes and corn.  · Peas, beans, and lentils.  · Milk and yogurt.  · Fruit and juice.  · Desserts, such as cakes, cookies, ice cream, and candy.  How does alcohol affect me?  Alcohol can cause a sudden decrease in blood glucose (hypoglycemia),  especially if you use insulin or take certain oral diabetes medicines. Hypoglycemia can be a life-threatening condition. Symptoms of hypoglycemia (sleepiness, dizziness, and confusion) are similar to symptoms of having too much alcohol.  If your health care provider says that alcohol is safe for you, follow these guidelines:  · Limit alcohol intake to no more than 1 drink per day for nonpregnant women and 2 drinks per day for men. One drink equals 12 oz of beer, 5 oz of wine, or 1½ oz of hard liquor.  · Do not drink on an empty stomach.  · Keep yourself hydrated with water, diet soda, or unsweetened iced tea.  · Keep in mind that regular soda, juice, and other mixers may contain a lot of sugar and must be counted as carbs.  What are tips for following this plan?    Reading food labels  · Start by checking the serving size on the "Nutrition Facts" label of packaged foods and drinks. The amount of calories, carbs, fats, and other nutrients listed on the label is based on one serving of the item. Many items contain more than one serving per package.  · Check the total grams (g) of carbs in one serving. You can calculate the number of servings of carbs in one serving by dividing the total carbs by 15. For example, if a food has 30 g of total carbs, it would be equal to 2   servings of carbs.  · Check the number of grams (g) of saturated and trans fats in one serving. Choose foods that have low or no amount of these fats.  · Check the number of milligrams (mg) of salt (sodium) in one serving. Most people should limit total sodium intake to less than 2,300 mg per day.  · Always check the nutrition information of foods labeled as "low-fat" or "nonfat". These foods may be higher in added sugar or refined carbs and should be avoided.  · Talk to your dietitian to identify your daily goals for nutrients listed on the label.  Shopping  · Avoid buying canned, premade, or processed foods. These foods tend to be high in fat, sodium,  and added sugar.  · Shop around the outside edge of the grocery store. This includes fresh fruits and vegetables, bulk grains, fresh meats, and fresh dairy.  Cooking  · Use low-heat cooking methods, such as baking, instead of high-heat cooking methods like deep frying.  · Cook using healthy oils, such as olive, canola, or sunflower oil.  · Avoid cooking with butter, cream, or high-fat meats.  Meal planning  · Eat meals and snacks regularly, preferably at the same times every day. Avoid going long periods of time without eating.  · Eat foods high in fiber, such as fresh fruits, vegetables, beans, and whole grains. Talk to your dietitian about how many servings of carbs you can eat at each meal.  · Eat 4-6 ounces (oz) of lean protein each day, such as lean meat, chicken, fish, eggs, or tofu. One oz of lean protein is equal to:  ? 1 oz of meat, chicken, or fish.  ? 1 egg.  ? ¼ cup of tofu.  · Eat some foods each day that contain healthy fats, such as avocado, nuts, seeds, and fish.  Lifestyle  · Check your blood glucose regularly.  · Exercise regularly as told by your health care provider. This may include:  ? 150 minutes of moderate-intensity or vigorous-intensity exercise each week. This could be brisk walking, biking, or water aerobics.  ? Stretching and doing strength exercises, such as yoga or weightlifting, at least 2 times a week.  · Take medicines as told by your health care provider.  · Do not use any products that contain nicotine or tobacco, such as cigarettes and e-cigarettes. If you need help quitting, ask your health care provider.  · Work with a counselor or diabetes educator to identify strategies to manage stress and any emotional and social challenges.  Questions to ask a health care provider  · Do I need to meet with a diabetes educator?  · Do I need to meet with a dietitian?  · What number can I call if I have questions?  · When are the best times to check my blood glucose?  Where to find more  information:  · American Diabetes Association: diabetes.org  · Academy of Nutrition and Dietetics: www.eatright.org  · National Institute of Diabetes and Digestive and Kidney Diseases (NIH): www.niddk.nih.gov  Summary  · A healthy meal plan will help you control your blood glucose and maintain a healthy lifestyle.  · Working with a diet and nutrition specialist (dietitian) can help you make a meal plan that is best for you.  · Keep in mind that carbohydrates (carbs) and alcohol have immediate effects on your blood glucose levels. It is important to count carbs and to use alcohol carefully.  This information is not intended to   replace advice given to you by your health care provider. Make sure you discuss any questions you have with your health care provider.  Document Released: 04/10/2005 Document Revised: 02/11/2017 Document Reviewed: 08/18/2016  Elsevier Interactive Patient Education © 2019 [employer]

## 2018-08-24 LAB — CMP14+EGFR
ALK PHOS: 54 IU/L (ref 39–117)
ALT: 21 IU/L (ref 0–44)
AST: 17 IU/L (ref 0–40)
Albumin/Globulin Ratio: 2 (ref 1.2–2.2)
Albumin: 4.6 g/dL (ref 3.8–4.8)
BUN / CREAT RATIO: 16 (ref 10–24)
BUN: 14 mg/dL (ref 8–27)
Bilirubin Total: 0.4 mg/dL (ref 0.0–1.2)
CALCIUM: 9.9 mg/dL (ref 8.6–10.2)
CHLORIDE: 103 mmol/L (ref 96–106)
CO2: 24 mmol/L (ref 20–29)
Creatinine, Ser: 0.88 mg/dL (ref 0.76–1.27)
GFR calc Af Amer: 103 mL/min/{1.73_m2} (ref >59)
GFR calc non Af Amer: 89 mL/min/{1.73_m2} (ref >59)
GLOBULIN, TOTAL: 2.3 g/dL (ref 1.5–4.5)
Glucose: 126 mg/dL — ABNORMAL HIGH (ref 65–99)
POTASSIUM: 4.5 mmol/L (ref 3.5–5.2)
SODIUM: 144 mmol/L (ref 134–144)
Total Protein: 6.9 g/dL (ref 6.0–8.5)

## 2018-08-24 LAB — CBC WITH DIFFERENTIAL/PLATELET
BASOS ABS: 0 10*3/uL (ref 0.0–0.2)
Basos: 1 %
EOS (ABSOLUTE): 0.1 10*3/uL (ref 0.0–0.4)
Eos: 3 %
HEMOGLOBIN: 13.6 g/dL (ref 13.0–17.7)
Hematocrit: 40.3 % (ref 37.5–51.0)
Immature Grans (Abs): 0 10*3/uL (ref 0.0–0.1)
Immature Granulocytes: 0 %
Lymphocytes Absolute: 1.9 10*3/uL (ref 0.7–3.1)
Lymphs: 38 %
MCH: 28.9 pg (ref 26.6–33.0)
MCHC: 33.7 g/dL (ref 31.5–35.7)
MCV: 86 fL (ref 79–97)
Monocytes Absolute: 0.4 10*3/uL (ref 0.1–0.9)
Monocytes: 8 %
Neutrophils Absolute: 2.5 10*3/uL (ref 1.4–7.0)
Neutrophils: 50 %
Platelets: 331 10*3/uL (ref 150–450)
RBC: 4.71 x10E6/uL (ref 4.14–5.80)
RDW: 13.3 % (ref 11.6–15.4)
WBC: 4.9 10*3/uL (ref 3.4–10.8)

## 2018-09-23 ENCOUNTER — Other Ambulatory Visit: Payer: Self-pay | Admitting: Family

## 2018-09-27 ENCOUNTER — Other Ambulatory Visit: Payer: Self-pay | Admitting: Family

## 2018-10-23 ENCOUNTER — Other Ambulatory Visit: Payer: Self-pay | Admitting: Family

## 2018-10-23 DIAGNOSIS — E1165 Type 2 diabetes mellitus with hyperglycemia: Secondary | ICD-10-CM

## 2018-10-23 DIAGNOSIS — I10 Essential (primary) hypertension: Secondary | ICD-10-CM

## 2018-10-23 DIAGNOSIS — N401 Enlarged prostate with lower urinary tract symptoms: Secondary | ICD-10-CM

## 2018-10-23 DIAGNOSIS — E1142 Type 2 diabetes mellitus with diabetic polyneuropathy: Secondary | ICD-10-CM

## 2018-10-23 DIAGNOSIS — E785 Hyperlipidemia, unspecified: Secondary | ICD-10-CM

## 2018-12-28 ENCOUNTER — Encounter: Payer: Self-pay | Admitting: Family

## 2018-12-28 ENCOUNTER — Other Ambulatory Visit: Payer: Self-pay

## 2018-12-28 ENCOUNTER — Ambulatory Visit: Payer: Medicare HMO | Admitting: Family

## 2018-12-28 NOTE — Progress Notes (Signed)
   Virtual Visit via telephone Note  Attempted to call 8:08 Am no answer. Called at 8:35 AM and left voicemail to call office back. Called  patient at 8:53 AM no answer.    Will close chart and reschedule.   Jannifer Rodney, FNP

## 2019-01-03 ENCOUNTER — Other Ambulatory Visit: Payer: Self-pay | Admitting: Family

## 2019-02-02 ENCOUNTER — Other Ambulatory Visit: Payer: Self-pay | Admitting: *Deleted

## 2019-02-02 DIAGNOSIS — E1142 Type 2 diabetes mellitus with diabetic polyneuropathy: Secondary | ICD-10-CM

## 2019-02-02 DIAGNOSIS — E1165 Type 2 diabetes mellitus with hyperglycemia: Secondary | ICD-10-CM

## 2019-02-02 MED ORDER — GABAPENTIN 300 MG PO CAPS: ORAL_CAPSULE | ORAL | 0 refills | Status: DC

## 2019-02-02 MED ORDER — METFORMIN HCL 500 MG PO TABS: ORAL_TABLET | ORAL | 0 refills | Status: DC

## 2019-02-02 MED ORDER — GLIMEPIRIDE 2 MG PO TABS: 2.0000 mg | ORAL_TABLET | Freq: Every morning | ORAL | 0 refills | Status: DC

## 2019-02-02 NOTE — Telephone Encounter (Signed)
Mail order refills Pt has appt on 02/04/19

## 2019-02-03 ENCOUNTER — Other Ambulatory Visit: Payer: Self-pay

## 2019-02-03 ENCOUNTER — Telehealth: Payer: Self-pay | Admitting: Family

## 2019-02-04 ENCOUNTER — Ambulatory Visit (INDEPENDENT_AMBULATORY_CARE_PROVIDER_SITE_OTHER): Payer: Medicare HMO | Admitting: Family

## 2019-02-04 ENCOUNTER — Encounter: Payer: Self-pay | Admitting: Family

## 2019-02-04 VITALS — BP 137/84 | HR 72 | Temp 97.0°F | Ht 66.0 in | Wt 184.6 lb

## 2019-02-04 DIAGNOSIS — E1169 Type 2 diabetes mellitus with other specified complication: Secondary | ICD-10-CM | POA: Diagnosis not present

## 2019-02-04 DIAGNOSIS — E785 Hyperlipidemia, unspecified: Secondary | ICD-10-CM | POA: Diagnosis not present

## 2019-02-04 DIAGNOSIS — E559 Vitamin D deficiency, unspecified: Secondary | ICD-10-CM | POA: Diagnosis not present

## 2019-02-04 DIAGNOSIS — E1165 Type 2 diabetes mellitus with hyperglycemia: Secondary | ICD-10-CM | POA: Diagnosis not present

## 2019-02-04 DIAGNOSIS — G4700 Insomnia, unspecified: Secondary | ICD-10-CM | POA: Diagnosis not present

## 2019-02-04 DIAGNOSIS — N401 Enlarged prostate with lower urinary tract symptoms: Secondary | ICD-10-CM

## 2019-02-04 DIAGNOSIS — E663 Overweight: Secondary | ICD-10-CM

## 2019-02-04 DIAGNOSIS — I10 Essential (primary) hypertension: Secondary | ICD-10-CM | POA: Diagnosis not present

## 2019-02-04 DIAGNOSIS — E1159 Type 2 diabetes mellitus with other circulatory complications: Secondary | ICD-10-CM

## 2019-02-04 DIAGNOSIS — E1142 Type 2 diabetes mellitus with diabetic polyneuropathy: Secondary | ICD-10-CM | POA: Diagnosis not present

## 2019-02-04 DIAGNOSIS — I152 Hypertension secondary to endocrine disorders: Secondary | ICD-10-CM

## 2019-02-04 LAB — BAYER DCA HB A1C WAIVED: HB A1C (BAYER DCA - WAIVED): 7 % — ABNORMAL HIGH (ref <7.0)

## 2019-02-04 MED ORDER — GLIMEPIRIDE 2 MG PO TABS: 2.0000 mg | ORAL_TABLET | Freq: Every morning | ORAL | 3 refills | Status: DC

## 2019-02-04 MED ORDER — METFORMIN HCL 500 MG PO TABS: ORAL_TABLET | ORAL | 4 refills | Status: DC

## 2019-02-04 MED ORDER — TAMSULOSIN HCL 0.4 MG PO CAPS: 0.4000 mg | ORAL_CAPSULE | Freq: Every day | ORAL | 4 refills | Status: DC

## 2019-02-04 MED ORDER — VITAMIN D (ERGOCALCIFEROL) 1.25 MG (50000 UNIT) PO CAPS: ORAL_CAPSULE | ORAL | 4 refills | Status: DC

## 2019-02-04 MED ORDER — SIMVASTATIN 20 MG PO TABS: 20.0000 mg | ORAL_TABLET | Freq: Every day | ORAL | 3 refills | Status: DC

## 2019-02-04 MED ORDER — SITAGLIPTIN PHOSPHATE 50 MG PO TABS: ORAL_TABLET | ORAL | 3 refills | Status: DC

## 2019-02-04 MED ORDER — DICLOFENAC SODIUM 75 MG PO TBEC: 75.0000 mg | DELAYED_RELEASE_TABLET | Freq: Two times a day (BID) | ORAL | 1 refills | Status: DC

## 2019-02-04 MED ORDER — LOSARTAN POTASSIUM 100 MG PO TABS: 100.0000 mg | ORAL_TABLET | Freq: Every day | ORAL | 4 refills | Status: DC

## 2019-02-04 MED ORDER — GABAPENTIN 300 MG PO CAPS: ORAL_CAPSULE | ORAL | 3 refills | Status: DC

## 2019-02-04 NOTE — Progress Notes (Signed)
Subjective:    Patient ID: Barry Doe Sr., adult    DOB: 06-May-1951, 68 y.o.   MRN: [id number]  Chief Complaint  Patient presents with  . Medical Management of Chronic Issues   PT presents to the office today for chronic follow up.  Hypertension This is a chronic problem. The current episode started more than 1 year ago. The problem has been resolved since onset. The problem is controlled. Pertinent negatives include no blurred vision, headaches, malaise/fatigue, peripheral edema or shortness of breath. Risk factors for coronary artery disease include dyslipidemia, diabetes mellitus and male gender. The current treatment provides moderate improvement. There is no history of CAD/MI, CVA or heart failure.  Diabetes She presents for her follow-up diabetic visit. She has type 2 diabetes mellitus. Her disease course has been stable. There are no hypoglycemic associated symptoms. Pertinent negatives for hypoglycemia include no headaches. Pertinent negatives for diabetes include no blurred vision and no foot paresthesias. There are no hypoglycemic complications. Symptoms are stable. Pertinent negatives for diabetic complications include no CVA, heart disease or nephropathy. Risk factors for coronary artery disease include diabetes mellitus, dyslipidemia, male sex, hypertension and sedentary lifestyle. She is following a generally healthy diet. Her overall blood glucose range is 130-140 mg/dl. Eye exam is not current.  Hyperlipidemia This is a chronic problem. The current episode started more than 1 year ago. The problem is controlled. Recent lipid tests were reviewed and are normal. Pertinent negatives include no shortness of breath. Current antihyperlipidemic treatment includes statins. The current treatment provides moderate improvement of lipids. Risk factors for coronary artery disease include dyslipidemia, diabetes mellitus, male sex, hypertension and a sedentary lifestyle.  Benign Prostatic  Hypertrophy This is a chronic problem. The current episode started more than 1 year ago. The problem occurs intermittently. The problem has been waxing and waning. Pertinent negatives include no headaches.  Insomnia Primary symptoms: difficulty falling asleep, no malaise/fatigue.  The current episode started more than one year. The onset quality is gradual. The problem occurs intermittently.      Review of Systems  Constitutional: Negative for malaise/fatigue.  Eyes: Negative for blurred vision.  Respiratory: Negative for shortness of breath.   Neurological: Negative for headaches.  Psychiatric/Behavioral: The patient has insomnia.   All other systems reviewed and are negative.      Objective:   Physical Exam Vitals signs reviewed.  Constitutional:      General: She is not in acute distress.    Appearance: She is well-developed.  HENT:     Head: Normocephalic.     Right Ear: Tympanic membrane normal.     Left Ear: Tympanic membrane normal.  Eyes:     General:        Right eye: No discharge.        Left eye: No discharge.     Pupils: Pupils are equal, round, and reactive to light.  Neck:     Musculoskeletal: Normal range of motion and neck supple.     Thyroid: No thyromegaly.  Cardiovascular:     Rate and Rhythm: Normal rate and regular rhythm.     Heart sounds: Normal heart sounds. No murmur.  Pulmonary:     Effort: Pulmonary effort is normal. No respiratory distress.     Breath sounds: Normal breath sounds. No wheezing.  Abdominal:     General: Bowel sounds are normal. There is no distension.     Palpations: Abdomen is soft.     Tenderness: There is no abdominal  tenderness.  Musculoskeletal: Normal range of motion.        General: No tenderness.  Skin:    General: Skin is warm and dry.     Findings: No erythema or rash.  Neurological:     Mental Status: She is alert and oriented to person, place, and time.     Cranial Nerves: No cranial nerve deficit.     Deep  Tendon Reflexes: Reflexes are normal and symmetric.  Psychiatric:        Behavior: Behavior normal.        Thought Content: Thought content normal.        Judgment: Judgment normal.      BP 137/84   Pulse 72   Temp (!) 97 F (36.1 C) (Oral)   Ht _0  (1.676 m)   Wt 184 lb 9.6 oz (83.7 kg)   BMI 29.80 kg/m       Assessment & Plan:  Barry Doe Sr. comes in today with chief complaint of Medical Management of Chronic Issues   Diagnosis and orders addressed:  1. Hypertension associated with diabetes ([place]) - CBC with Differential/Platelet - CMP14+EGFR - losartan (COZAAR) 100 MG tablet; Take 1 tablet (100 mg total) by mouth daily.  Dispense: 90 tablet; Refill: 4  2. Hyperlipidemia associated with type 2 diabetes mellitus (HCC) - CBC with Differential/Platelet - CMP14+EGFR - Lipid panel - simvastatin (ZOCOR) 20 MG tablet; Take 1 tablet (20 mg total) by mouth at bedtime.  Dispense: 90 tablet; Refill: 3  3. Type 2 diabetes mellitus with hyperglycemia, without long-term current use of insulin (HCC) - Bayer DCA Hb A1c Waived - CBC with Differential/Platelet - CMP14+EGFR - glimepiride (AMARYL) 2 MG tablet; Take 1 tablet (2 mg total) by mouth every morning.  Dispense: 90 tablet; Refill: 3 - sitaGLIPtin (JANUVIA) 50 MG tablet; TAKE ONE (1) TABLET EACH DAY  Dispense: 90 tablet; Refill: 3 - metFORMIN (GLUCOPHAGE) 500 MG tablet; Take 2 tablets (1,000 mg total) by mouth every morning AND 3 tablets (1,500 mg total) at bedtime.  Dispense: 450 tablet; Refill: 4  4. Diabetic polyneuropathy associated with type 2 diabetes mellitus (HCC) - CBC with Differential/Platelet - CMP14+EGFR - gabapentin (NEURONTIN) 300 MG capsule; TAKE 1 CAPSULE THREE TIMES DAILY  Dispense: 270 capsule; Refill: 3 - glimepiride (AMARYL) 2 MG tablet; Take 1 tablet (2 mg total) by mouth every morning.  Dispense: 90 tablet; Refill: 3 - sitaGLIPtin (JANUVIA) 50 MG tablet; TAKE ONE (1) TABLET EACH DAY  Dispense:  90 tablet; Refill: 3 - metFORMIN (GLUCOPHAGE) 500 MG tablet; Take 2 tablets (1,000 mg total) by mouth every morning AND 3 tablets (1,500 mg total) at bedtime.  Dispense: 450 tablet; Refill: 4  5. Benign prostatic hyperplasia with lower urinary tract symptoms, symptom details unspecified - CBC with Differential/Platelet - CMP14+EGFR - tamsulosin (FLOMAX) 0.4 MG CAPS capsule; Take 1 capsule (0.4 mg total) by mouth daily.  Dispense: 90 capsule; Refill: 4  6. Overweight (BMI 25.0-29.9) - CBC with Differential/Platelet - CMP14+EGFR  7. Insomnia, unspecified type - CBC with Differential/Platelet - CMP14+EGFR  8. Vitamin D deficiency - CBC with Differential/Platelet - CMP14+EGFR - VITAMIN D 25 Hydroxy (Vit-D Deficiency, Fractures) - Vitamin D, Ergocalciferol, (DRISDOL) 1.25 MG (50000 UT) CAPS capsule; TAKE 1 CAPSULE EVERY WEEK  Dispense: 12 capsule; Refill: 4    Labs pending Health Maintenance reviewed Diet and exercise encouraged  Follow up plan: 6 months    Evelina Dun, FNP

## 2019-02-04 NOTE — Patient Instructions (Signed)
Dehydration, Adult  Dehydration is a condition in which there is not enough fluid or water in the body. This happens when you lose more fluids than you take in. Important organs, such as the kidneys, brain, and heart, cannot function without a proper amount of fluids. Any loss of fluids from the body can lead to dehydration. Dehydration can range from mild to severe. This condition should be treated right away to prevent it from becoming severe. What are the causes? This condition may be caused by:  Vomiting.  Diarrhea.  Excessive sweating, such as from heat exposure or exercise.  Not drinking enough fluid, especially: ? When ill. ? While doing activity that requires a lot of energy.  Excessive urination.  Fever.  Infection.  Certain medicines, such as medicines that cause the body to lose excess fluid (diuretics).  Inability to access safe drinking water.  Reduced physical ability to get adequate water and food. What increases the risk? This condition is more likely to develop in people:  Who have a poorly controlled long-term (chronic) illness, such as diabetes, heart disease, or kidney disease.  Who are age 65 or older.  Who are disabled.  Who live in a place with high altitude.  Who play endurance sports. What are the signs or symptoms? Symptoms of mild dehydration may include:  Thirst.  Dry lips.  Slightly dry mouth.  Dry, warm skin.  Dizziness. Symptoms of moderate dehydration may include:  Very dry mouth.  Muscle cramps.  Dark urine. Urine may be the color of tea.  Decreased urine production.  Decreased tear production.  Heartbeat that is irregular or faster than normal (palpitations).  Headache.  Light-headedness, especially when you stand up from a sitting position.  Fainting (syncope). Symptoms of severe dehydration may include:  Changes in skin, such as: ? Cold and clammy skin. ? Blotchy (mottled) or pale skin. ? Skin that does  not quickly return to normal after being lightly pinched and released (poor skin turgor).  Changes in body fluids, such as: ? Extreme thirst. ? No tear production. ? Inability to sweat when body temperature is high, such as in hot weather. ? Very little urine production.  Changes in vital signs, such as: ? Weak pulse. ? Pulse that is more than 100 beats a minute when sitting still. ? Rapid breathing. ? Low blood pressure.  Other changes, such as: ? Sunken eyes. ? Cold hands and feet. ? Confusion. ? Lack of energy (lethargy). ? Difficulty waking up from sleep. ? Short-term weight loss. ? Unconsciousness. How is this diagnosed? This condition is diagnosed based on your symptoms and a physical exam. Blood and urine tests may be done to help confirm the diagnosis. How is this treated? Treatment for this condition depends on the severity. Mild or moderate dehydration can often be treated at home. Treatment should be started right away. Do not wait until dehydration becomes severe. Severe dehydration is an emergency and it needs to be treated in a hospital. Treatment for mild dehydration may include:  Drinking more fluids.  Replacing salts and minerals in your blood (electrolytes) that you may have lost. Treatment for moderate dehydration may include:  Drinking an oral rehydration solution (ORS). This is a drink that helps you replace fluids and electrolytes (rehydrate). It can be found at pharmacies and retail stores. Treatment for severe dehydration may include:  Receiving fluids through an IV tube.  Receiving an electrolyte solution through a feeding tube that is passed through your nose and   into your stomach (nasogastric tube, or NG tube).  Correcting any abnormalities in electrolytes.  Treating the underlying cause of dehydration. Follow these instructions at home:  If directed by your health care provider, drink an ORS: ? Make an ORS by following instructions on the  package. ? Start by drinking small amounts, about  cup (120 mL) every 5-10 minutes. ? Slowly increase how much you drink until you have taken the amount recommended by your health care provider.  Drink enough clear fluid to keep your urine clear or pale yellow. If you were told to drink an ORS, finish the ORS first, then start slowly drinking other clear fluids. Drink fluids such as: ? Water. Do not drink only water. Doing that can lead to having too little salt (sodium) in the body (hyponatremia). ? Ice chips. ? Fruit juice that you have added water to (diluted fruit juice). ? Low-calorie sports drinks.  Avoid: ? Alcohol. ? Drinks that contain a lot of sugar. These include high-calorie sports drinks, fruit juice that is not diluted, and soda. ? Caffeine. ? Foods that are greasy or contain a lot of fat or sugar.  Take over-the-counter and prescription medicines only as told by your health care provider.  Do not take sodium tablets. This can lead to having too much sodium in the body (hypernatremia).  Eat foods that contain a healthy balance of electrolytes, such as bananas, oranges, potatoes, tomatoes, and spinach.  Keep all follow-up visits as told by your health care provider. This is important. Contact a health care provider if:  You have abdominal pain that: ? Gets worse. ? Stays in one area (localizes).  You have a rash.  You have a stiff neck.  You are more irritable than usual.  You are sleepier or more difficult to wake up than usual.  You feel weak or dizzy.  You feel very thirsty.  You have urinated only a small amount of very dark urine over 6-8 hours. Get help right away if:  You have symptoms of severe dehydration.  You cannot drink fluids without vomiting.  Your symptoms get worse with treatment.  You have a fever.  You have a severe headache.  You have vomiting or diarrhea that: ? Gets worse. ? Does not go away.  You have blood or green matter  (bile) in your vomit.  You have blood in your stool. This may cause stool to look black and tarry.  You have not urinated in 6-8 hours.  You faint.  Your heart rate while sitting still is over 100 beats a minute.  You have trouble breathing. This information is not intended to replace advice given to you by your health care provider. Make sure you discuss any questions you have with your health care provider. Document Released: 07/14/2005 Document Revised: 06/26/2017 Document Reviewed: 09/07/2015 Elsevier Patient Education  2020 [employer]  

## 2019-02-05 LAB — CMP14+EGFR
ALT: 22 IU/L (ref 0–44)
AST: 16 IU/L (ref 0–40)
Albumin/Globulin Ratio: 2.2 (ref 1.2–2.2)
Albumin: 4.6 g/dL (ref 3.8–4.8)
Alkaline Phosphatase: 48 IU/L (ref 39–117)
BUN/Creatinine Ratio: 13 (ref 10–24)
BUN: 12 mg/dL (ref 8–27)
Bilirubin Total: 0.3 mg/dL (ref 0.0–1.2)
CO2: 22 mmol/L (ref 20–29)
Calcium: 10.1 mg/dL (ref 8.6–10.2)
Chloride: 105 mmol/L (ref 96–106)
Creatinine, Ser: 0.94 mg/dL (ref 0.76–1.27)
GFR calc Af Amer: 96 mL/min/{1.73_m2} (ref >59)
GFR calc non Af Amer: 83 mL/min/{1.73_m2} (ref >59)
Globulin, Total: 2.1 g/dL (ref 1.5–4.5)
Glucose: 127 mg/dL — ABNORMAL HIGH (ref 65–99)
Potassium: 4.6 mmol/L (ref 3.5–5.2)
Sodium: 145 mmol/L — ABNORMAL HIGH (ref 134–144)
Total Protein: 6.7 g/dL (ref 6.0–8.5)

## 2019-02-05 LAB — CBC WITH DIFFERENTIAL/PLATELET
Basophils Absolute: 0 10*3/uL (ref 0.0–0.2)
Basos: 1 %
EOS (ABSOLUTE): 0.1 10*3/uL (ref 0.0–0.4)
Eos: 2 %
Hematocrit: 40 % (ref 37.5–51.0)
Hemoglobin: 13.1 g/dL (ref 13.0–17.7)
Immature Grans (Abs): 0 10*3/uL (ref 0.0–0.1)
Immature Granulocytes: 0 %
Lymphocytes Absolute: 2 10*3/uL (ref 0.7–3.1)
Lymphs: 38 %
MCH: 27.7 pg (ref 26.6–33.0)
MCHC: 32.8 g/dL (ref 31.5–35.7)
MCV: 85 fL (ref 79–97)
Monocytes Absolute: 0.4 10*3/uL (ref 0.1–0.9)
Monocytes: 8 %
Neutrophils Absolute: 2.6 10*3/uL (ref 1.4–7.0)
Neutrophils: 51 %
Platelets: 326 10*3/uL (ref 150–450)
RBC: 4.73 x10E6/uL (ref 4.14–5.80)
RDW: 13.3 % (ref 11.6–15.4)
WBC: 5.1 10*3/uL (ref 3.4–10.8)

## 2019-02-05 LAB — LIPID PANEL
Chol/HDL Ratio: 3.6 ratio (ref 0.0–5.0)
Cholesterol, Total: 130 mg/dL (ref 100–199)
HDL: 36 mg/dL — ABNORMAL LOW (ref >39)
LDL Calculated: 74 mg/dL (ref 0–99)
Triglycerides: 101 mg/dL (ref 0–149)
VLDL Cholesterol Cal: 20 mg/dL (ref 5–40)

## 2019-02-05 LAB — VITAMIN D 25 HYDROXY (VIT D DEFICIENCY, FRACTURES): Vit D, 25-Hydroxy: 50.2 ng/mL (ref 30.0–100.0)

## 2019-03-08 ENCOUNTER — Telehealth: Payer: Self-pay | Admitting: Family

## 2019-03-08 NOTE — Telephone Encounter (Signed)
Please advise on question about medication.

## 2019-03-10 NOTE — Telephone Encounter (Signed)
This was a refill request from his pharmacy. This is a NSAID and usually used for arthritis or some type of pain. If he is not having any pain, he does not need to use this and we can delete from his med list.

## 2019-03-10 NOTE — Telephone Encounter (Signed)
lmtcb

## 2019-03-14 NOTE — Telephone Encounter (Signed)
Aware. 

## 2019-03-24 ENCOUNTER — Telehealth: Payer: Self-pay | Admitting: Family

## 2019-03-24 NOTE — Telephone Encounter (Signed)
appt scheduled Pt notified 

## 2019-03-29 ENCOUNTER — Other Ambulatory Visit: Payer: Self-pay

## 2019-03-29 ENCOUNTER — Ambulatory Visit (INDEPENDENT_AMBULATORY_CARE_PROVIDER_SITE_OTHER): Payer: Medicare HMO | Admitting: Family

## 2019-03-29 ENCOUNTER — Encounter: Payer: Self-pay | Admitting: Family

## 2019-03-29 VITALS — BP 136/66 | HR 73 | Temp 98.1°F | Ht 66.0 in | Wt 186.4 lb

## 2019-03-29 DIAGNOSIS — E1165 Type 2 diabetes mellitus with hyperglycemia: Secondary | ICD-10-CM

## 2019-03-29 DIAGNOSIS — Z23 Encounter for immunization: Secondary | ICD-10-CM

## 2019-03-29 DIAGNOSIS — R5383 Other fatigue: Secondary | ICD-10-CM | POA: Diagnosis not present

## 2019-03-29 DIAGNOSIS — R6889 Other general symptoms and signs: Secondary | ICD-10-CM | POA: Diagnosis not present

## 2019-03-29 DIAGNOSIS — R0602 Shortness of breath: Secondary | ICD-10-CM | POA: Diagnosis not present

## 2019-03-29 MED ORDER — BLOOD GLUCOSE METER KIT: PACK | 0 refills | Status: DC

## 2019-03-29 NOTE — Progress Notes (Signed)
Subjective:    Patient ID: Barry Smith., male    DOB: 10-05-1950, 68 y.o.   MRN: [id number]  Chief Complaint  Patient presents with  . Blood sugar has been fluctuating   Pt presents to the office today with fatigue that started last week. He states his glucose ranging from 85-250. He states the fatigue comes on all of a sudden and he goes and lays down. He states he feels slightly SOB when it happens.  Diabetes He presents for his follow-up diabetic visit. He has type 2 diabetes mellitus. His disease course has been stable. There are no hypoglycemic associated symptoms. Pertinent negatives for diabetes include no blurred vision and no foot paresthesias. Symptoms are stable. Diabetic complications include heart disease. Pertinent negatives for diabetic complications include no CVA, nephropathy or peripheral neuropathy. Risk factors for coronary artery disease include dyslipidemia, male sex, hypertension and sedentary lifestyle. He is following a generally healthy diet. His overall blood glucose range is 130-140 mg/dl. Eye exam is not current.      Review of Systems  Eyes: Negative for blurred vision.  All other systems reviewed and are negative.      Objective:   Physical Exam Vitals signs reviewed.  Constitutional:      General: He is not in acute distress.    Appearance: He is well-developed.  HENT:     Head: Normocephalic.     Right Ear: Tympanic membrane normal.     Left Ear: Tympanic membrane normal.  Eyes:     General:        Right eye: No discharge.        Left eye: No discharge.     Pupils: Pupils are equal, round, and reactive to light.  Neck:     Musculoskeletal: Normal range of motion and neck supple.     Thyroid: No thyromegaly.  Cardiovascular:     Rate and Rhythm: Normal rate and regular rhythm.     Heart sounds: Normal heart sounds. No murmur.  Pulmonary:     Effort: Pulmonary effort is normal. No respiratory distress.     Breath sounds: Normal  breath sounds. No wheezing.  Abdominal:     General: Bowel sounds are normal. There is no distension.     Palpations: Abdomen is soft.     Tenderness: There is no abdominal tenderness.  Musculoskeletal: Normal range of motion.        General: No tenderness.  Skin:    General: Skin is warm and dry.     Findings: No erythema or rash.  Neurological:     Mental Status: He is alert and oriented to person, place, and time.     Cranial Nerves: No cranial nerve deficit.     Deep Tendon Reflexes: Reflexes are normal and symmetric.  Psychiatric:        Behavior: Behavior normal.        Thought Content: Thought content normal.        Judgment: Judgment normal.       BP 136/66   Pulse 73   Temp 98.1 F (36.7 C) (Oral)   Ht '5\' 6"'$  (1.676 m)   Wt 186 lb 6 oz (84.5 kg)   BMI 30.08 kg/m      Assessment & Plan:  Barry Haq Sr. comes in today with chief complaint of Blood sugar has been fluctuating   Diagnosis and orders addressed:  1. Need for immunization against influenza - Flu Vaccine QUAD High Dose(Fluad) -  CMP14+EGFR - Anemia Profile B  2. Fatigue, unspecified type Rest Force fluids Labs pending  - EKG 12-Lead - CMP14+EGFR - Anemia Profile B - TSH - Ambulatory referral to Cardiology  3. SOB (shortness of breath) - Ambulatory referral to Cardiology   4. Type 2 diabetes mellitus with hyperglycemia, without long-term current use of insulin (HCC) - blood glucose meter kit and supplies; Dispense based on patient and insurance preference. Use up to four times daily as directed. (FOR ICD-10 E10.9, E11.9).  Dispense: 1 each; Refill: 0   Barry Dun, FNP

## 2019-03-29 NOTE — Patient Instructions (Signed)

## 2019-03-30 LAB — CMP14+EGFR
ALT: 27 IU/L (ref 0–44)
AST: 19 IU/L (ref 0–40)
Albumin/Globulin Ratio: 1.9 (ref 1.2–2.2)
Albumin: 4.4 g/dL (ref 3.8–4.8)
Alkaline Phosphatase: 48 IU/L (ref 39–117)
BUN/Creatinine Ratio: 11 (ref 10–24)
BUN: 10 mg/dL (ref 8–27)
Bilirubin Total: 0.2 mg/dL (ref 0.0–1.2)
CO2: 24 mmol/L (ref 20–29)
Calcium: 9.5 mg/dL (ref 8.6–10.2)
Chloride: 103 mmol/L (ref 96–106)
Creatinine, Ser: 0.89 mg/dL (ref 0.76–1.27)
GFR calc Af Amer: 102 mL/min/{1.73_m2} (ref >59)
GFR calc non Af Amer: 88 mL/min/{1.73_m2} (ref >59)
Globulin, Total: 2.3 g/dL (ref 1.5–4.5)
Glucose: 178 mg/dL — ABNORMAL HIGH (ref 65–99)
Potassium: 4 mmol/L (ref 3.5–5.2)
Sodium: 141 mmol/L (ref 134–144)
Total Protein: 6.7 g/dL (ref 6.0–8.5)

## 2019-03-30 LAB — ANEMIA PROFILE B
Basophils Absolute: 0 10*3/uL (ref 0.0–0.2)
Basos: 1 %
EOS (ABSOLUTE): 0.2 10*3/uL (ref 0.0–0.4)
Eos: 5 %
Ferritin: 46 ng/mL (ref 30–400)
Folate: 10.9 ng/mL (ref >3.0)
Hematocrit: 36.1 % — ABNORMAL LOW (ref 37.5–51.0)
Hemoglobin: 12.3 g/dL — ABNORMAL LOW (ref 13.0–17.7)
Immature Grans (Abs): 0 10*3/uL (ref 0.0–0.1)
Immature Granulocytes: 0 %
Iron Saturation: 15 % (ref 15–55)
Iron: 53 ug/dL (ref 38–169)
Lymphocytes Absolute: 1.9 10*3/uL (ref 0.7–3.1)
Lymphs: 37 %
MCH: 28.8 pg (ref 26.6–33.0)
MCHC: 34.1 g/dL (ref 31.5–35.7)
MCV: 85 fL (ref 79–97)
Monocytes Absolute: 0.4 10*3/uL (ref 0.1–0.9)
Monocytes: 8 %
Neutrophils Absolute: 2.5 10*3/uL (ref 1.4–7.0)
Neutrophils: 49 %
Platelets: 294 10*3/uL (ref 150–450)
RBC: 4.27 x10E6/uL (ref 4.14–5.80)
RDW: 13 % (ref 11.6–15.4)
Retic Ct Pct: 0.9 % (ref 0.6–2.6)
Total Iron Binding Capacity: 349 ug/dL (ref 250–450)
UIBC: 296 ug/dL (ref 111–343)
Vitamin B-12: 279 pg/mL (ref 232–1245)
WBC: 5.1 10*3/uL (ref 3.4–10.8)

## 2019-03-30 LAB — TSH: TSH: 2.56 u[IU]/mL (ref 0.450–4.500)

## 2019-04-11 ENCOUNTER — Other Ambulatory Visit: Payer: Self-pay | Admitting: Family

## 2019-04-11 DIAGNOSIS — E1142 Type 2 diabetes mellitus with diabetic polyneuropathy: Secondary | ICD-10-CM

## 2019-04-11 DIAGNOSIS — E1165 Type 2 diabetes mellitus with hyperglycemia: Secondary | ICD-10-CM

## 2019-05-05 ENCOUNTER — Telehealth: Payer: Self-pay | Admitting: Cardiology

## 2019-05-05 NOTE — Telephone Encounter (Signed)

## 2019-05-06 ENCOUNTER — Ambulatory Visit: Payer: Medicare HMO | Admitting: Cardiology

## 2019-05-06 ENCOUNTER — Encounter: Payer: Self-pay | Admitting: Cardiology

## 2019-05-06 ENCOUNTER — Other Ambulatory Visit: Payer: Self-pay

## 2019-05-06 VITALS — BP 144/73 | HR 72 | Temp 97.6°F | Ht 66.0 in | Wt 185.0 lb

## 2019-05-06 DIAGNOSIS — E1165 Type 2 diabetes mellitus with hyperglycemia: Secondary | ICD-10-CM | POA: Diagnosis not present

## 2019-05-06 DIAGNOSIS — I10 Essential (primary) hypertension: Secondary | ICD-10-CM | POA: Diagnosis not present

## 2019-05-06 DIAGNOSIS — E782 Mixed hyperlipidemia: Secondary | ICD-10-CM

## 2019-05-06 DIAGNOSIS — R0602 Shortness of breath: Secondary | ICD-10-CM | POA: Diagnosis not present

## 2019-05-06 NOTE — Progress Notes (Signed)
Cardiology Office Note  Date: 05/06/2019   ID: Barry Derry., DOB 09-24-1950, MRN [id number]  PCP:  Sharion Balloon, FNP  Consulting Cardiologist:  Rozann Lesches, MD Electrophysiologist:  None   Chief Complaint  Patient presents with   Intermittent fatigue    History of Present Illness: Barry Smith Sr. is a 68 y.o. male referred for cardiology consultation by Ms. Hawks FNP for evaluation of shortness of breath and fatigue based on review of records.  He presents today stating that he feels "pretty good" most of the time.  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  He states that he is "tired" at the end of a long day, sometimes fatigue when he is doing work, but does not report any exertional chest pain and describes NYHA class II dyspnea.  It appears that he was seen by our practice years ago, although I could not pull up any actual office notes.  He did have an echocardiogram done back in 2008 that showed LVEF 45 to 50% range with posterolateral hypokinesis.  I reviewed his recent ECG as outlined below.  He also had lab work done in September which I reviewed.  He has a history of hypertension, hyperlipidemia, and type 2 diabetes mellitus.  I talked with him about arranging follow-up testing to reassess LVEF and also undergo formal ischemic evaluation via Myoview.  He stated that he did not want to undergo any further testing at this time, but would consider it if he felt like his symptoms are worsening.  Past Medical History:  Diagnosis Date   Anxiety    Essential hypertension    Hyperlipidemia    Type 2 diabetes mellitus ([place])     Past Surgical History:  Procedure Laterality Date   APPENDECTOMY      Current Outpatient Medications  Medication Sig Dispense Refill   ACCU-CHEK AVIVA PLUS test strip TEST BLOOD SUGAR FOUR TIMES DAILY FOR DIABETES 350 each 2   ACCU-CHEK SOFTCLIX LANCETS lancets  TEST BLOOD SUGAR FOUR TIMES DAILY 400 each 3   aspirin 81 MG tablet Take 81 mg by mouth daily.     blood glucose meter kit and supplies Dispense based on patient and insurance preference. Use up to four times daily as directed. (FOR ICD-10 E10.9, E11.9). 1 each 0   cholecalciferol (VITAMIN D) 1000 UNITS tablet Take 1 tablet (1,000 Units total) by mouth daily. 90 tablet 3   diclofenac (VOLTAREN) 75 MG EC tablet Take 1 tablet (75 mg total) by mouth 2 (two) times daily. 30 tablet 1   gabapentin (NEURONTIN) 300 MG capsule TAKE 1 CAPSULE THREE TIMES DAILY 270 capsule 3   glimepiride (AMARYL) 2 MG tablet Take 1 tablet (2 mg total) by mouth every morning. 90 tablet 3   losartan (COZAAR) 100 MG tablet Take 1 tablet (100 mg total) by mouth daily. 90 tablet 4   metFORMIN (GLUCOPHAGE) 500 MG tablet Take 2 tablets (1,000 mg total) by mouth every morning AND 3 tablets (1,500 mg total) at bedtime. 450 tablet 4   Multiple Vitamin (MULTIVITAMIN) capsule Take 1 capsule by mouth daily.     Omega-3 Fatty Acids (FISH OIL) 1200 MG CAPS Take 2 capsules by mouth daily.      simvastatin (ZOCOR) 20 MG tablet Take 1 tablet (20 mg total) by mouth at bedtime. 90 tablet 3   sitaGLIPtin (JANUVIA) 50 MG tablet TAKE ONE (1) TABLET EACH  DAY 90 tablet 3   tamsulosin (FLOMAX) 0.4 MG CAPS capsule Take 1 capsule (0.4 mg total) by mouth daily. 90 capsule 4   Vitamin D, Ergocalciferol, (DRISDOL) 1.25 MG (50000 UT) CAPS capsule TAKE 1 CAPSULE EVERY WEEK 12 capsule 4   No current facility-administered medications for this visit.    Allergies:  Patient has no known allergies.   Social History: The patient  reports that he has never smoked. He has never used smokeless tobacco. He reports that he does not drink alcohol or use drugs.   Family History: The patient's family history includes Cancer in his sister; Diabetes in his brother, brother, brother, brother, brother, father, sister, sister, sister, sister, sister, and  sister; Pneumonia in his brother.   ROS:  Please see the history of present illness. Otherwise, complete review of systems is positive for none.  All other systems are reviewed and negative.   Physical Exam: VS:  BP (!) 144/73    Pulse 72    Temp 97.6 F (36.4 C)    Ht '5\' 6"'$  (1.676 m)    Wt 185 lb (83.9 kg)    SpO2 100%    BMI 29.86 kg/m , BMI Body mass index is 29.86 kg/m.  Wt Readings from Last 3 Encounters:  05/06/19 185 lb (83.9 kg)  03/29/19 186 lb 6 oz (84.5 kg)  02/04/19 184 lb 9.6 oz (83.7 kg)    General: Patient appears comfortable at rest. HEENT: Conjunctiva and lids normal, wearing a mask. Neck: Supple, no elevated JVP or carotid bruits, no thyromegaly. Lungs: Clear to auscultation, nonlabored breathing at rest. Cardiac: Regular rate and rhythm, no S3 or significant systolic murmur, no pericardial rub. Abdomen: Soft, nontender, bowel sounds present. Extremities: No pitting edema, distal pulses 2+. Skin: Warm and dry. Musculoskeletal: No kyphosis. Neuropsychiatric: Alert and oriented x3, affect grossly appropriate.  ECG:  An ECG dated 03/29/2019 was personally reviewed today and demonstrated:  Sinus rhythm with nonspecific T wave changes and lead loss in V3 and V6.  Recent Labwork: 03/29/2019: ALT 27; AST 19; BUN 10; Creatinine, Ser 0.89; Hemoglobin 12.3; Platelets 294; Potassium 4.0; Sodium 141; TSH 2.560     Component Value Date/Time   CHOL 130 02/04/2019 1023   CHOL 105 02/11/2013 1240   TRIG 101 02/04/2019 1023   TRIG 123 05/27/2013 1014   TRIG 99 02/11/2013 1240   HDL 36 (L) 02/04/2019 1023   HDL 42 05/27/2013 1014   HDL 35 (L) 02/11/2013 1240   CHOLHDL 3.6 02/04/2019 1023   LDLCALC 74 02/04/2019 1023   LDLCALC 78 05/27/2013 1014   LDLCALC 50 02/11/2013 1240    Other Studies Reviewed Today:  Echocardiogram 11/16/2006: SUMMARY  - The left ventricle was mildly dilated. Overall left ventricular     systolic function was mildly decreased. Left  ventricular     ejection fraction was estimated , range being 45 % to 50 %.     hypokinesis of the posterolateral wall. Left ventricular wall     thickness was at the upper limits of normal.  - The left atrium was mildly dilated.   Assessment and Plan:  1.  Intermittent fatigue and shortness of breath in a 68 year old male with history of type 2 diabetes mellitus, hypertension, and hyperlipidemia.  I do not have access to remote cardiac testing other than an echocardiogram from 2008 that indicated LVEF 45 to 50% with posterior lateral hypokinesis.  I have explained to him that I would suggest proceeding with an echocardiogram  for repeat cardiac structural assessment and also ischemic testing, beginning with a Lexiscan Myoview.  At this point he did not want to pursue any further testing unless his symptoms worsen.  I have recommended that he follow-up with his PCP and he can be referred back if he would like to undergo further evaluation.  2.  Essential hypertension, systolic is in the 427C today.  He is on Cozaar.  3.  Mixed hyperlipidemia, on Zocor.  Recent LDL 74.  4.  Type 2 diabetes mellitus, on Januvia, Amaryl, and Glucophage.  Last hemoglobin A1c was 7% in July.  Medication Adjustments/Labs and Tests Ordered: Current medicines are reviewed at length with the patient today.  Concerns regarding medicines are outlined above.   Tests Ordered: No orders of the defined types were placed in this encounter.   Medication Changes: No orders of the defined types were placed in this encounter.   Disposition:  Follow up with PCP and return to office if he would like to pursue further evaluation.  Signed, Satira Sark, MD, [facility] 05/06/2019 11:15 AM    [facility] [place], [place], [place] [address] Phone: [phone]; Fax: [phone]

## 2019-05-06 NOTE — Patient Instructions (Addendum)
Medication Instructions:   Your physician recommends that you continue on your current medications as directed. Please refer to the Current Medication list given to you today.  Labwork:  NONE  Testing/Procedures:  NONE  Follow-Up:  Your physician recommends that you schedule a follow-up appointment in: as needed.   Any Other Special Instructions Will Be Listed Below (If Applicable).  If you need a refill on your cardiac medications before your next appointment, please call your pharmacy. 

## 2019-06-15 ENCOUNTER — Encounter: Payer: Self-pay | Admitting: Family

## 2019-06-15 ENCOUNTER — Ambulatory Visit (INDEPENDENT_AMBULATORY_CARE_PROVIDER_SITE_OTHER): Payer: Medicare HMO | Admitting: Family

## 2019-06-15 ENCOUNTER — Other Ambulatory Visit: Payer: Self-pay

## 2019-06-15 VITALS — BP 130/67 | HR 72 | Temp 97.3°F | Ht 66.0 in | Wt 185.0 lb

## 2019-06-15 DIAGNOSIS — I10 Essential (primary) hypertension: Secondary | ICD-10-CM

## 2019-06-15 DIAGNOSIS — E1159 Type 2 diabetes mellitus with other circulatory complications: Secondary | ICD-10-CM | POA: Diagnosis not present

## 2019-06-15 DIAGNOSIS — E559 Vitamin D deficiency, unspecified: Secondary | ICD-10-CM

## 2019-06-15 DIAGNOSIS — E785 Hyperlipidemia, unspecified: Secondary | ICD-10-CM | POA: Diagnosis not present

## 2019-06-15 DIAGNOSIS — E1169 Type 2 diabetes mellitus with other specified complication: Secondary | ICD-10-CM

## 2019-06-15 DIAGNOSIS — G4700 Insomnia, unspecified: Secondary | ICD-10-CM | POA: Diagnosis not present

## 2019-06-15 DIAGNOSIS — E663 Overweight: Secondary | ICD-10-CM

## 2019-06-15 DIAGNOSIS — E1142 Type 2 diabetes mellitus with diabetic polyneuropathy: Secondary | ICD-10-CM | POA: Diagnosis not present

## 2019-06-15 DIAGNOSIS — N401 Enlarged prostate with lower urinary tract symptoms: Secondary | ICD-10-CM | POA: Diagnosis not present

## 2019-06-15 DIAGNOSIS — E1165 Type 2 diabetes mellitus with hyperglycemia: Secondary | ICD-10-CM

## 2019-06-15 LAB — BAYER DCA HB A1C WAIVED: HB A1C (BAYER DCA - WAIVED): 6.8 % (ref <7.0)

## 2019-06-15 MED ORDER — SITAGLIPTIN PHOSPHATE 50 MG PO TABS: ORAL_TABLET | ORAL | 3 refills | Status: DC

## 2019-06-15 MED ORDER — GLIMEPIRIDE 2 MG PO TABS: 2.0000 mg | ORAL_TABLET | Freq: Every morning | ORAL | 3 refills | Status: DC

## 2019-06-15 MED ORDER — GABAPENTIN 300 MG PO CAPS: ORAL_CAPSULE | ORAL | 3 refills | Status: DC

## 2019-06-15 MED ORDER — METFORMIN HCL 500 MG PO TABS: ORAL_TABLET | ORAL | 4 refills | Status: DC

## 2019-06-15 MED ORDER — LOSARTAN POTASSIUM 100 MG PO TABS: 100.0000 mg | ORAL_TABLET | Freq: Every day | ORAL | 4 refills | Status: DC

## 2019-06-15 MED ORDER — SIMVASTATIN 20 MG PO TABS: 20.0000 mg | ORAL_TABLET | Freq: Every day | ORAL | 3 refills | Status: DC

## 2019-06-15 MED ORDER — VITAMIN D (ERGOCALCIFEROL) 1.25 MG (50000 UNIT) PO CAPS: ORAL_CAPSULE | ORAL | 4 refills | Status: DC

## 2019-06-15 MED ORDER — TAMSULOSIN HCL 0.4 MG PO CAPS: 0.4000 mg | ORAL_CAPSULE | Freq: Every day | ORAL | 4 refills | Status: DC

## 2019-06-15 NOTE — Patient Instructions (Signed)

## 2019-06-15 NOTE — Progress Notes (Signed)
Subjective:    Patient ID: Barry Derry., male    DOB: 02-Nov-1950, 68 y.o.   MRN: [id number]  Chief Complaint  Patient presents with  . Medical Management of Chronic Issues   PT presents to the office today for chronic follow up. Hypertension This is a chronic problem. The current episode started more than 1 year ago. The problem has been resolved since onset. The problem is controlled. Pertinent negatives include no blurred vision, headaches, malaise/fatigue, peripheral edema or shortness of breath. Risk factors for coronary artery disease include dyslipidemia, diabetes mellitus, obesity and sedentary lifestyle. The current treatment provides moderate improvement. There is no history of CVA or heart failure.  Hyperlipidemia This is a chronic problem. The current episode started more than 1 year ago. The problem is controlled. Recent lipid tests were reviewed and are normal. Pertinent negatives include no shortness of breath. Current antihyperlipidemic treatment includes statins. The current treatment provides moderate improvement of lipids. Risk factors for coronary artery disease include dyslipidemia, diabetes mellitus, male sex, hypertension and a sedentary lifestyle.  Diabetes He presents for his follow-up diabetic visit. He has type 2 diabetes mellitus. His disease course has been stable. Pertinent negatives for hypoglycemia include no headaches. Pertinent negatives for diabetes include no blurred vision, no foot paresthesias and no visual change. There are no hypoglycemic complications. Symptoms are stable. Diabetic complications include heart disease. Pertinent negatives for diabetic complications include no CVA or nephropathy. Risk factors for coronary artery disease include dyslipidemia, diabetes mellitus, male sex, hypertension and sedentary lifestyle. He is following a generally healthy diet. When asked about meal planning, he reported none. His overall blood glucose range is  110-130 mg/dl. Eye exam is current.  Insomnia Primary symptoms: difficulty falling asleep, frequent awakening, no malaise/fatigue.  The current episode started more than one year. The onset quality is gradual. The problem occurs intermittently. The treatment provided moderate relief.  Benign Prostatic Hypertrophy This is a chronic problem. The current episode started more than 1 year ago. The problem has been waxing and waning since onset.  Diabetic Neuropathy Pt states his pain is a 0 out 10 since taking Gabapentin 300 mg TID.     Review of Systems  Constitutional: Negative for malaise/fatigue.  Eyes: Negative for blurred vision.  Respiratory: Negative for shortness of breath.   Neurological: Negative for headaches.  Psychiatric/Behavioral: The patient has insomnia.   All other systems reviewed and are negative.      Objective:   Physical Exam Vitals signs reviewed.  Constitutional:      General: He is not in acute distress.    Appearance: He is well-developed.  HENT:     Head: Normocephalic.     Right Ear: There is impacted cerumen.     Left Ear: There is impacted cerumen.  Eyes:     General:        Right eye: No discharge.        Left eye: No discharge.     Pupils: Pupils are equal, round, and reactive to light.  Neck:     Musculoskeletal: Normal range of motion and neck supple.     Thyroid: No thyromegaly.  Cardiovascular:     Rate and Rhythm: Normal rate and regular rhythm.     Heart sounds: Normal heart sounds. No murmur.  Pulmonary:     Effort: Pulmonary effort is normal. No respiratory distress.     Breath sounds: Normal breath sounds. No wheezing.  Abdominal:     General:  Bowel sounds are normal. There is no distension.     Palpations: Abdomen is soft.     Tenderness: There is no abdominal tenderness.  Musculoskeletal: Normal range of motion.        General: No tenderness.  Skin:    General: Skin is warm and dry.     Findings: No erythema or rash.   Neurological:     Mental Status: He is alert and oriented to person, place, and time.     Cranial Nerves: No cranial nerve deficit.     Deep Tendon Reflexes: Reflexes are normal and symmetric.  Psychiatric:        Behavior: Behavior normal.        Thought Content: Thought content normal.        Judgment: Judgment normal.       BP 130/67   Pulse 72   Temp (!) 97.3 F (36.3 C) (Temporal)   Ht 5' 6"  (1.676 m)   Wt 185 lb (83.9 kg)   SpO2 98%   BMI 29.86 kg/m      Assessment & Plan:  Barry Doe Sr. comes in today with chief complaint of Medical Management of Chronic Issues   Diagnosis and orders addressed:  1. Type 2 diabetes mellitus with hyperglycemia, without long-term current use of insulin (HCC) - hgba1c - sitaGLIPtin (JANUVIA) 50 MG tablet; TAKE ONE (1) TABLET EACH DAY  Dispense: 90 tablet; Refill: 3 - glimepiride (AMARYL) 2 MG tablet; Take 1 tablet (2 mg total) by mouth every morning.  Dispense: 90 tablet; Refill: 3 - metFORMIN (GLUCOPHAGE) 500 MG tablet; Take 2 tablets (1,000 mg total) by mouth every morning AND 3 tablets (1,500 mg total) at bedtime.  Dispense: 450 tablet; Refill: 4  2. Hypertension associated with diabetes ([employer]) - CBC with Differential/Platelet - Lipid panel - CMP14+EGFR - hgba1c - losartan (COZAAR) 100 MG tablet; Take 1 tablet (100 mg total) by mouth daily.  Dispense: 90 tablet; Refill: 4  3. Hyperlipidemia associated with type 2 diabetes mellitus (HCC) - Lipid panel - simvastatin (ZOCOR) 20 MG tablet; Take 1 tablet (20 mg total) by mouth at bedtime.  Dispense: 90 tablet; Refill: 3  4. Diabetic polyneuropathy associated with type 2 diabetes mellitus (HCC) - sitaGLIPtin (JANUVIA) 50 MG tablet; TAKE ONE (1) TABLET EACH DAY  Dispense: 90 tablet; Refill: 3 - glimepiride (AMARYL) 2 MG tablet; Take 1 tablet (2 mg total) by mouth every morning.  Dispense: 90 tablet; Refill: 3 - gabapentin (NEURONTIN) 300 MG capsule; TAKE 1 CAPSULE THREE TIMES  DAILY  Dispense: 270 capsule; Refill: 3 - metFORMIN (GLUCOPHAGE) 500 MG tablet; Take 2 tablets (1,000 mg total) by mouth every morning AND 3 tablets (1,500 mg total) at bedtime.  Dispense: 450 tablet; Refill: 4  5. Benign prostatic hyperplasia with lower urinary tract symptoms, symptom details unspecified - tamsulosin (FLOMAX) 0.4 MG CAPS capsule; Take 1 capsule (0.4 mg total) by mouth daily.  Dispense: 90 capsule; Refill: 4  6. Vitamin D deficiency - Vitamin D, Ergocalciferol, (DRISDOL) 1.25 MG (50000 UT) CAPS capsule; TAKE 1 CAPSULE EVERY WEEK  Dispense: 12 capsule; Refill: 4  7. Overweight (BMI 25.0-29.9)  8. Insomnia, unspecified type   Labs pending Health Maintenance reviewed Diet and exercise encouraged  Follow up plan: 4 months    Evelina Dun, FNP

## 2019-06-16 ENCOUNTER — Other Ambulatory Visit: Payer: Self-pay | Admitting: Family

## 2019-06-16 LAB — CMP14+EGFR
ALT: 22 IU/L (ref 0–44)
AST: 21 IU/L (ref 0–40)
Albumin/Globulin Ratio: 2 (ref 1.2–2.2)
Albumin: 4.5 g/dL (ref 3.8–4.8)
Alkaline Phosphatase: 51 IU/L (ref 39–117)
BUN/Creatinine Ratio: 13 (ref 10–24)
BUN: 12 mg/dL (ref 8–27)
Bilirubin Total: 0.3 mg/dL (ref 0.0–1.2)
CO2: 21 mmol/L (ref 20–29)
Calcium: 10.1 mg/dL (ref 8.6–10.2)
Chloride: 101 mmol/L (ref 96–106)
Creatinine, Ser: 0.89 mg/dL (ref 0.76–1.27)
GFR calc Af Amer: 102 mL/min/{1.73_m2} (ref >59)
GFR calc non Af Amer: 88 mL/min/{1.73_m2} (ref >59)
Globulin, Total: 2.3 g/dL (ref 1.5–4.5)
Glucose: 150 mg/dL — ABNORMAL HIGH (ref 65–99)
Potassium: 4.7 mmol/L (ref 3.5–5.2)
Sodium: 140 mmol/L (ref 134–144)
Total Protein: 6.8 g/dL (ref 6.0–8.5)

## 2019-06-16 LAB — CBC WITH DIFFERENTIAL/PLATELET
Basophils Absolute: 0 10*3/uL (ref 0.0–0.2)
Basos: 1 %
EOS (ABSOLUTE): 0.2 10*3/uL (ref 0.0–0.4)
Eos: 3 %
Hematocrit: 38.5 % (ref 37.5–51.0)
Hemoglobin: 12.7 g/dL — ABNORMAL LOW (ref 13.0–17.7)
Immature Grans (Abs): 0 10*3/uL (ref 0.0–0.1)
Immature Granulocytes: 0 %
Lymphocytes Absolute: 2.1 10*3/uL (ref 0.7–3.1)
Lymphs: 35 %
MCH: 28.5 pg (ref 26.6–33.0)
MCHC: 33 g/dL (ref 31.5–35.7)
MCV: 86 fL (ref 79–97)
Monocytes Absolute: 0.4 10*3/uL (ref 0.1–0.9)
Monocytes: 7 %
Neutrophils Absolute: 3.2 10*3/uL (ref 1.4–7.0)
Neutrophils: 54 %
Platelets: 320 10*3/uL (ref 150–450)
RBC: 4.46 x10E6/uL (ref 4.14–5.80)
RDW: 13.3 % (ref 11.6–15.4)
WBC: 5.9 10*3/uL (ref 3.4–10.8)

## 2019-06-16 LAB — LIPID PANEL
Chol/HDL Ratio: 3.8 ratio (ref 0.0–5.0)
Cholesterol, Total: 132 mg/dL (ref 100–199)
HDL: 35 mg/dL — ABNORMAL LOW (ref >39)
LDL Chol Calc (NIH): 75 mg/dL (ref 0–99)
Triglycerides: 118 mg/dL (ref 0–149)
VLDL Cholesterol Cal: 22 mg/dL (ref 5–40)

## 2019-06-16 MED ORDER — ATORVASTATIN CALCIUM 40 MG PO TABS: 40.0000 mg | ORAL_TABLET | Freq: Every day | ORAL | 1 refills | Status: DC

## 2019-07-12 ENCOUNTER — Other Ambulatory Visit: Payer: Self-pay | Admitting: Family

## 2019-09-04 ENCOUNTER — Other Ambulatory Visit: Payer: Self-pay | Admitting: Family

## 2019-09-13 ENCOUNTER — Other Ambulatory Visit: Payer: Self-pay

## 2019-09-16 ENCOUNTER — Ambulatory Visit: Payer: Medicare HMO | Admitting: Family

## 2019-09-22 ENCOUNTER — Ambulatory Visit: Payer: Medicare HMO | Admitting: Family

## 2019-09-26 ENCOUNTER — Other Ambulatory Visit: Payer: Self-pay

## 2019-09-27 ENCOUNTER — Ambulatory Visit: Payer: Medicare HMO | Attending: Internal Medicine

## 2019-09-27 ENCOUNTER — Ambulatory Visit (INDEPENDENT_AMBULATORY_CARE_PROVIDER_SITE_OTHER): Payer: Medicare HMO | Admitting: Family

## 2019-09-27 ENCOUNTER — Encounter: Payer: Self-pay | Admitting: Family

## 2019-09-27 VITALS — BP 139/71 | HR 58 | Temp 97.8°F | Ht 66.0 in | Wt 188.6 lb

## 2019-09-27 DIAGNOSIS — I10 Essential (primary) hypertension: Secondary | ICD-10-CM

## 2019-09-27 DIAGNOSIS — H6123 Impacted cerumen, bilateral: Secondary | ICD-10-CM

## 2019-09-27 DIAGNOSIS — I152 Hypertension secondary to endocrine disorders: Secondary | ICD-10-CM

## 2019-09-27 DIAGNOSIS — E663 Overweight: Secondary | ICD-10-CM

## 2019-09-27 DIAGNOSIS — E1159 Type 2 diabetes mellitus with other circulatory complications: Secondary | ICD-10-CM | POA: Diagnosis not present

## 2019-09-27 DIAGNOSIS — E1165 Type 2 diabetes mellitus with hyperglycemia: Secondary | ICD-10-CM

## 2019-09-27 DIAGNOSIS — E785 Hyperlipidemia, unspecified: Secondary | ICD-10-CM

## 2019-09-27 DIAGNOSIS — N401 Enlarged prostate with lower urinary tract symptoms: Secondary | ICD-10-CM

## 2019-09-27 DIAGNOSIS — G4700 Insomnia, unspecified: Secondary | ICD-10-CM

## 2019-09-27 DIAGNOSIS — E1142 Type 2 diabetes mellitus with diabetic polyneuropathy: Secondary | ICD-10-CM

## 2019-09-27 DIAGNOSIS — E559 Vitamin D deficiency, unspecified: Secondary | ICD-10-CM

## 2019-09-27 DIAGNOSIS — Z23 Encounter for immunization: Secondary | ICD-10-CM | POA: Insufficient documentation

## 2019-09-27 DIAGNOSIS — E1169 Type 2 diabetes mellitus with other specified complication: Secondary | ICD-10-CM | POA: Diagnosis not present

## 2019-09-27 LAB — CMP14+EGFR
ALT: 22 IU/L (ref 0–44)
AST: 21 IU/L (ref 0–40)
Albumin/Globulin Ratio: 2 (ref 1.2–2.2)
Albumin: 4.5 g/dL (ref 3.8–4.8)
Alkaline Phosphatase: 50 IU/L (ref 39–117)
BUN/Creatinine Ratio: 11 (ref 10–24)
BUN: 11 mg/dL (ref 8–27)
Bilirubin Total: 0.6 mg/dL (ref 0.0–1.2)
CO2: 24 mmol/L (ref 20–29)
Calcium: 9.8 mg/dL (ref 8.6–10.2)
Chloride: 105 mmol/L (ref 96–106)
Creatinine, Ser: 0.97 mg/dL (ref 0.76–1.27)
GFR calc Af Amer: 92 mL/min/{1.73_m2} (ref >59)
GFR calc non Af Amer: 80 mL/min/{1.73_m2} (ref >59)
Globulin, Total: 2.3 g/dL (ref 1.5–4.5)
Glucose: 75 mg/dL (ref 65–99)
Potassium: 4 mmol/L (ref 3.5–5.2)
Sodium: 146 mmol/L — ABNORMAL HIGH (ref 134–144)
Total Protein: 6.8 g/dL (ref 6.0–8.5)

## 2019-09-27 LAB — CBC WITH DIFFERENTIAL/PLATELET
Basophils Absolute: 0 10*3/uL (ref 0.0–0.2)
Basos: 1 %
EOS (ABSOLUTE): 0.1 10*3/uL (ref 0.0–0.4)
Eos: 2 %
Hematocrit: 38.8 % (ref 37.5–51.0)
Hemoglobin: 13 g/dL (ref 13.0–17.7)
Immature Grans (Abs): 0 10*3/uL (ref 0.0–0.1)
Immature Granulocytes: 0 %
Lymphocytes Absolute: 1.7 10*3/uL (ref 0.7–3.1)
Lymphs: 31 %
MCH: 28.8 pg (ref 26.6–33.0)
MCHC: 33.5 g/dL (ref 31.5–35.7)
MCV: 86 fL (ref 79–97)
Monocytes Absolute: 0.4 10*3/uL (ref 0.1–0.9)
Monocytes: 7 %
Neutrophils Absolute: 3.1 10*3/uL (ref 1.4–7.0)
Neutrophils: 59 %
Platelets: 286 10*3/uL (ref 150–450)
RBC: 4.52 x10E6/uL (ref 4.14–5.80)
RDW: 13.3 % (ref 11.6–15.4)
WBC: 5.4 10*3/uL (ref 3.4–10.8)

## 2019-09-27 LAB — BAYER DCA HB A1C WAIVED: HB A1C (BAYER DCA - WAIVED): 6.7 % (ref <7.0)

## 2019-09-27 NOTE — Patient Instructions (Signed)
Earwax Buildup, Adult The ears produce a substance called earwax that helps keep bacteria out of the ear and protects the skin in the ear canal. Occasionally, earwax can build up in the ear and cause discomfort or hearing loss. What increases the risk? This condition is more likely to develop in people who:  Are male.  Are elderly.  Naturally produce more earwax.  Clean their ears often with cotton swabs.  Use earplugs often.  Use in-ear headphones often.  Wear hearing aids.  Have narrow ear canals.  Have earwax that is overly thick or sticky.  Have eczema.  Are dehydrated.  Have excess hair in the ear canal. What are the signs or symptoms? Symptoms of this condition include:  Reduced or muffled hearing.  A feeling of fullness in the ear or feeling that the ear is plugged.  Fluid coming from the ear.  Ear pain.  Ear itch.  Ringing in the ear.  Coughing.  An obvious piece of earwax that can be seen inside the ear canal. How is this diagnosed? This condition may be diagnosed based on:  Your symptoms.  Your medical history.  An ear exam. During the exam, your health care provider will look into your ear with an instrument called an otoscope. You may have tests, including a hearing test. How is this treated? This condition may be treated by:  Using ear drops to soften the earwax.  Having the earwax removed by a health care provider. The health care provider may: ? Flush the ear with water. ? Use an instrument that has a loop on the end (curette). ? Use a suction device.  Surgery to remove the wax buildup. This may be done in severe cases. Follow these instructions at home:   Take over-the-counter and prescription medicines only as told by your health care provider.  Do not put any objects, including cotton swabs, into your ear. You can clean the opening of your ear canal with a washcloth or facial tissue.  Follow instructions from your health care  provider about cleaning your ears. Do not over-clean your ears.  Drink enough fluid to keep your urine clear or pale yellow. This will help to thin the earwax.  Keep all follow-up visits as told by your health care provider. If earwax builds up in your ears often or if you use hearing aids, consider seeing your health care provider for routine, preventive ear cleanings. Ask your health care provider how often you should schedule your cleanings.  If you have hearing aids, clean them according to instructions from the manufacturer and your health care provider. Contact a health care provider if:  You have ear pain.  You develop a fever.  You have blood, pus, or other fluid coming from your ear.  You have hearing loss.  You have ringing in your ears that does not go away.  Your symptoms do not improve with treatment.  You feel like the room is spinning (vertigo). Summary  Earwax can build up in the ear and cause discomfort or hearing loss.  The most common symptoms of this condition include reduced or muffled hearing and a feeling of fullness in the ear or feeling that the ear is plugged.  This condition may be diagnosed based on your symptoms, your medical history, and an ear exam.  This condition may be treated by using ear drops to soften the earwax or by having the earwax removed by a health care provider.  Do not put any   objects, including cotton swabs, into your ear. You can clean the opening of your ear canal with a washcloth or facial tissue. This information is not intended to replace advice given to you by your health care provider. Make sure you discuss any questions you have with your health care provider. Document Revised: 06/26/2017 Document Reviewed: 09/24/2016 Elsevier Patient Education  2020 [employer]  

## 2019-09-27 NOTE — Progress Notes (Signed)
Subjective:    Patient ID: Barry Derry., male    DOB: 1950-10-29, 69 y.o.   MRN: [id number]  Chief Complaint  Patient presents with  . Medical Management of Chronic Issues   PT presents to the office today for chronic follow up. Hypertension This is a chronic problem. The current episode started more than 1 year ago. The problem has been resolved since onset. The problem is controlled. Pertinent negatives include no blurred vision, malaise/fatigue, peripheral edema or shortness of breath. Risk factors for coronary artery disease include dyslipidemia, diabetes mellitus, obesity and male gender. The current treatment provides moderate improvement. There is no history of CVA.  Hyperlipidemia This is a chronic problem. The current episode started more than 1 year ago. The problem is controlled. Recent lipid tests were reviewed and are normal. Exacerbating diseases include obesity. Pertinent negatives include no shortness of breath. Current antihyperlipidemic treatment includes statins. The current treatment provides moderate improvement of lipids. Risk factors for coronary artery disease include dyslipidemia, diabetes mellitus, hypertension, male sex and a sedentary lifestyle.  Diabetes He presents for his follow-up diabetic visit. He has type 2 diabetes mellitus. His disease course has been stable. There are no hypoglycemic associated symptoms. Associated symptoms include foot paresthesias. Pertinent negatives for diabetes include no blurred vision. There are no hypoglycemic complications. Symptoms are stable. Diabetic complications include peripheral neuropathy. Pertinent negatives for diabetic complications include no CVA or heart disease. Risk factors for coronary artery disease include dyslipidemia, diabetes mellitus, male sex, hypertension and sedentary lifestyle. He is following a generally healthy diet. His overall blood glucose range is 90-110 mg/dl. An ACE inhibitor/angiotensin II  receptor blocker is being taken.  Benign Prostatic Hypertrophy This is a chronic problem. The current episode started more than 1 year ago. The problem has been waxing and waning since onset. Irritative symptoms do not include nocturia. The treatment provided moderate relief.  Insomnia Primary symptoms: difficulty falling asleep, frequent awakening, no malaise/fatigue.  The current episode started more than one year. The onset quality is gradual. The problem occurs intermittently.  Diabetic Neuropathy PT taking gabapentin 300 mg TID. Working well.     Review of Systems  Constitutional: Negative for malaise/fatigue.  Eyes: Negative for blurred vision.  Respiratory: Negative for shortness of breath.   Genitourinary: Negative for nocturia.  Psychiatric/Behavioral: The patient has insomnia.   All other systems reviewed and are negative.      Objective:   Physical Exam Vitals reviewed.  Constitutional:      General: He is not in acute distress.    Appearance: He is well-developed.  HENT:     Head: Normocephalic.     Right Ear: Tympanic membrane normal. There is impacted cerumen.     Left Ear: Tympanic membrane normal. There is impacted cerumen.  Eyes:     General:        Right eye: No discharge.        Left eye: No discharge.     Pupils: Pupils are equal, round, and reactive to light.  Neck:     Thyroid: No thyromegaly.  Cardiovascular:     Rate and Rhythm: Normal rate and regular rhythm.     Heart sounds: Normal heart sounds. No murmur.  Pulmonary:     Effort: Pulmonary effort is normal. No respiratory distress.     Breath sounds: Normal breath sounds. No wheezing.  Abdominal:     General: Bowel sounds are normal. There is no distension.     Palpations:  Abdomen is soft.     Tenderness: There is no abdominal tenderness.  Musculoskeletal:        General: No tenderness. Normal range of motion.     Cervical back: Normal range of motion and neck supple.  Skin:    General:  Skin is warm and dry.     Findings: No erythema or rash.  Neurological:     Mental Status: He is alert and oriented to person, place, and time.     Cranial Nerves: No cranial nerve deficit.     Deep Tendon Reflexes: Reflexes are normal and symmetric.  Psychiatric:        Behavior: Behavior normal.        Thought Content: Thought content normal.        Judgment: Judgment normal.    Bilateral ears irrigated with warm water and peroxide, tolerated well, TM WNL   BP 139/71   Pulse (!) 58   Temp 97.8 F (36.6 C) (Temporal)   Ht 5' 6"  (1.676 m)   Wt 188 lb 9.6 oz (85.5 kg)   SpO2 100%   BMI 30.44 kg/m      Assessment & Plan:  Barry Doe Sr. comes in today with chief complaint of Medical Management of Chronic Issues   Diagnosis and orders addressed:  1. Hypertension associated with diabetes ([place]) - CMP14+EGFR - CBC with Differential/Platelet  2. Diabetic polyneuropathy associated with type 2 diabetes mellitus (HCC) - CMP14+EGFR - CBC with Differential/Platelet  3. Type 2 diabetes mellitus with hyperglycemia, without long-term current use of insulin (HCC) - Bayer DCA Hb A1c Waived - CMP14+EGFR - CBC with Differential/Platelet - Microalbumin / creatinine urine ratio  4. Hyperlipidemia associated with type 2 diabetes mellitus (HCC) - CMP14+EGFR - CBC with Differential/Platelet  5. Benign prostatic hyperplasia with lower urinary tract symptoms, symptom details unspecified - CMP14+EGFR - CBC with Differential/Platelet  6. Insomnia, unspecified type - CMP14+EGFR - CBC with Differential/Platelet  7. Overweight (BMI 25.0-29.9) - CMP14+EGFR - CBC with Differential/Platelet  8. Vitamin D deficiency - CMP14+EGFR - CBC with Differential/Platelet  9. Impacted cerumen of both ears  Labs pending Health Maintenance reviewed Diet and exercise encouraged  Follow up plan: 6 months   Evelina Dun, FNP

## 2019-09-28 LAB — MICROALBUMIN / CREATININE URINE RATIO
Creatinine, Urine: 122 mg/dL
Microalb/Creat Ratio: 9 mg/g creat (ref 0–29)
Microalbumin, Urine: 10.6 ug/mL

## 2019-10-18 ENCOUNTER — Ambulatory Visit (INDEPENDENT_AMBULATORY_CARE_PROVIDER_SITE_OTHER): Payer: Medicare HMO

## 2019-10-18 DIAGNOSIS — Z0000 Encounter for general adult medical examination without abnormal findings: Secondary | ICD-10-CM

## 2019-10-18 NOTE — Progress Notes (Addendum)
MEDICARE ANNUAL WELLNESS VISIT  10/18/2019  Telephone Visit Disclaimer This Medicare AWV was conducted by telephone due to national recommendations for restrictions regarding the COVID-19 Pandemic (e.g. social distancing).  I verified, using two identifiers, that I am speaking with Barry Doe Sr. or their authorized healthcare agent. I discussed the limitations, risks, security, and privacy concerns of performing an evaluation and management service by telephone and the potential availability of an in-person appointment in the future. The patient expressed understanding and agreed to proceed.   Subjective:  Barry Nazareno Sr. is a 69 y.o. male patient of Hawks, Theador Hawthorne, FNP who had a Medicare Annual Wellness Visit today via telephone. Barry Smith is Retired and lives with their spouse. he has 2 children. he reports that he is socially active and does interact with friends/family regularly. he is minimally physically active . Patient is thinking about starting a boxing class.  Patient Care Team: Sharion Balloon, FNP as PCP - General (Family Medicine) Satira Sark, MD as PCP - Cardiology (Cardiology)  Advanced Directives 03/02/2018 09/24/2017  Does Patient Have a Medical Advance Directive? No No  Would patient like information on creating a medical advance directive? No - Patient declined [facility] Utilization Over the Past 12 Months: # of hospitalizations or ER visits: 0 # of surgeries: 0  Review of Systems    Patient reports that his overall health is better compared to last year.  Patient Reported Readings (BP, Pulse, CBG, Weight, etc) none  Pain Assessment       Current Medications & Allergies (verified) Allergies as of 10/18/2019   No Known Allergies      Medication List        Accurate as of October 18, 2019  9:01 AM. If you have any questions, ask your nurse or doctor.          Accu-Chek Aviva Plus test strip Generic drug: glucose blood TEST  BLOOD SUGAR FOUR TIMES DAILY FOR DIABETES   Accu-Chek Softclix Lancets lancets TEST BLOOD SUGAR FOUR TIMES DAILY   aspirin 81 MG tablet Take 81 mg by mouth daily.   atorvastatin 40 MG tablet Commonly known as: LIPITOR TAKE ONE (1) TABLET EACH DAY   blood glucose meter kit and supplies Dispense based on patient and insurance preference. Use up to four times daily as directed. (FOR ICD-10 E10.9, E11.9).   cholecalciferol 1000 units tablet Commonly known as: VITAMIN D Take 1 tablet (1,000 Units total) by mouth daily.   Fish Oil 1200 MG Caps Take 2 capsules by mouth daily.   gabapentin 300 MG capsule Commonly known as: NEURONTIN TAKE 1 CAPSULE THREE TIMES DAILY   glimepiride 2 MG tablet Commonly known as: AMARYL Take 1 tablet (2 mg total) by mouth every morning.   losartan 100 MG tablet Commonly known as: COZAAR Take 1 tablet (100 mg total) by mouth daily.   metFORMIN 500 MG tablet Commonly known as: GLUCOPHAGE Take 2 tablets (1,000 mg total) by mouth every morning AND 3 tablets (1,500 mg total) at bedtime.   multivitamin capsule Take 1 capsule by mouth daily.   sitaGLIPtin 50 MG tablet Commonly known as: Januvia TAKE ONE (1) TABLET EACH DAY   tamsulosin 0.4 MG Caps capsule Commonly known as: FLOMAX Take 1 capsule (0.4 mg total) by mouth daily.   Vitamin D (Ergocalciferol) 1.25 MG (50000 UNIT) Caps capsule Commonly known as: DRISDOL TAKE 1 CAPSULE EVERY WEEK  History (reviewed): Past Medical History:  Diagnosis Date   Anxiety    Essential hypertension    Hyperlipidemia    Type 2 diabetes mellitus (HCC)    Past Surgical History:  Procedure Laterality Date   APPENDECTOMY     Family History  Problem Relation Age of Onset   Diabetes Father    Diabetes Sister    Pneumonia Brother    Diabetes Sister    Diabetes Sister    Diabetes Sister    Diabetes Sister    Diabetes Sister    Cancer Sister    Diabetes Brother    Diabetes Brother     Diabetes Brother    Diabetes Brother    Diabetes Brother    Social History   Socioeconomic History   Marital status: Single    Spouse name: Not on file   Number of children: Not on file   Years of education: Not on file   Highest education level: Not on file  Occupational History   Not on file  Tobacco Use   Smoking status: Never Smoker   Smokeless tobacco: Never Used  Substance and Sexual Activity   Alcohol use: No   Drug use: No   Sexual activity: Yes  Other Topics Concern   Not on file  Social History Narrative   Not on file   Social Determinants of Health   Financial Resource Strain:    Difficulty of Paying Living Expenses:   Food Insecurity:    Worried About Charity fundraiser in the Last Year:    Arboriculturist in the Last Year:   Transportation Needs:    Film/video editor (Medical):    Lack of Transportation (Non-Medical):   Physical Activity:    Days of Exercise per Week:    Minutes of Exercise per Session:   Stress:    Feeling of Stress :   Social Connections:    Frequency of Communication with Friends and Family:    Frequency of Social Gatherings with Friends and Family:    Attends Religious Services:    Active Member of Clubs or Organizations:    Attends Archivist Meetings:    Marital Status:     Activities of Daily Living No flowsheet data found.  Patient Education/ Literacy    Exercise    Diet Patient reports consuming 2 meals a day and 2 snack(s) a day Patient reports that his primary diet is: Regular Patient reports that she does have regular access to food.   Depression Screen PHQ 2/9 Scores 06/15/2019 03/29/2019 02/04/2019 08/23/2018 05/19/2018 03/02/2018 02/11/2018  PHQ - 2 Score 0 0 0 0 0 0 0     Fall Risk Fall Risk  06/15/2019 02/04/2019 08/23/2018 05/19/2018 03/02/2018  Falls in the past year? 0 0 0 No No     Objective:  Barry Doe Sr. seemed alert and oriented and he participated appropriately during our  telephone visit.  Blood Pressure Weight BMI  BP Readings from Last 3 Encounters:  09/27/19 139/71  06/15/19 130/67  05/06/19 (!) 144/73   Wt Readings from Last 3 Encounters:  09/27/19 188 lb 9.6 oz (85.5 kg)  06/15/19 185 lb (83.9 kg)  05/06/19 185 lb (83.9 kg)   BMI Readings from Last 1 Encounters:  09/27/19 30.44 kg/m    *Unable to obtain current vital signs, weight, and BMI due to telephone visit type  Hearing/Vision  Barry Smith did not seem to have difficulty with hearing/understanding during the telephone  conversation Reports that he has not had a formal eye exam by an eye care professional within the past year Reports that he has not had a formal hearing evaluation within the past year *Unable to fully assess hearing and vision during telephone visit type  Cognitive Function: No flowsheet data found. (Normal:0-7, Significant for Dysfunction: >8)  Normal Cognitive Function Screening: Yes   Immunization & Health Maintenance Record Immunization History  Administered Date(s) Administered   Fluad Quad(high Dose 65+) 03/29/2019   Influenza, High Dose Seasonal PF 05/20/2016, 05/26/2017, 05/19/2018   Influenza,inj,Quad PF,6+ Mos 05/27/2013, 08/10/2014, 05/17/2015   Moderna SARS-COVID-2 Vaccination 09/27/2019   Pneumococcal Conjugate-13 08/10/2014   Pneumococcal Polysaccharide-23 01/22/2017   Td 04/29/2017    Health Maintenance  Topic Date Due   OPHTHALMOLOGY EXAM  03/17/2019   HEMOGLOBIN A1C  03/29/2020   FOOT EXAM  06/14/2020   COLONOSCOPY  01/31/2021   TETANUS/TDAP  04/30/2027   INFLUENZA VACCINE  Completed   Hepatitis C Screening  Completed   PNA vac Low Risk Adult  Completed       Assessment  This is a routine wellness examination for Barry SmithMarland Kitchen  Health Maintenance: Due or Overdue Health Maintenance Due  Topic Date Due   OPHTHALMOLOGY EXAM  03/17/2019    Barry Doe Sr. does not need a referral for Community Assistance: Care  Management:   no Social Work:    no Prescription Assistance:  no Nutrition/Diabetes Education:  no   Plan:  Personalized Goals  Exercise 150 minutes per weeks  Limit sugar intake food/drinks  Personalized Health Maintenance & Screening Recommendations    Lung Cancer Screening Recommended: no (Low Dose CT Chest recommended if Age 26-80 years, 30 pack-year currently smoking OR have quit w/in past 15 years) Hepatitis C Screening recommended: no HIV Screening recommended: no  Advanced Directives: Written information was prepared per patient's request.  Referrals & Orders No orders of the defined types were placed in this encounter.   Follow-up Plan Follow-up with Sharion Balloon, FNP as planned Schedule 03/30/20  I have personally reviewed and noted the following in the patient's chart:   Medical and social history Use of alcohol, tobacco or illicit drugs  Current medications and supplements Functional ability and status Nutritional status Physical activity Advanced directives List of other physicians Hospitalizations, surgeries, and ER visits in previous 12 months Vitals Screenings to include cognitive, depression, and falls Referrals and appointments  In addition, I have reviewed and discussed with Barry Doe Sr. certain preventive protocols, quality metrics, and best practice recommendations. A written personalized care plan for preventive services as well as general preventive health recommendations is available and can be mailed to the patient at his request.      Alphonzo Dublin, LPN  2/84/1324   I have reviewed and agree with the above AWV documentation.   Evelina Dun, FNP

## 2019-10-21 ENCOUNTER — Telehealth: Payer: Self-pay | Admitting: Family

## 2019-10-21 NOTE — Chronic Care Management (AMB) (Signed)
  Chronic Care Management   Note  10/21/2019 Name: Tavien Chestnut Sr. MRN: [id number] DOB: 1950-08-05  [name] is a 69 y.o. year old male who is a primary care patient of Sharion Balloon, FNP. I reached out to Elizabeth. by phone today in response to a referral sent by Mr. Read Bonelli Sr.'s health plan.     Mr. Lazarus wife Tye Maryland was given information about Chronic Care Management services today including:  1. CCM service includes personalized support from designated clinical staff supervised by his physician, including individualized plan of care and coordination with other care providers 2. 24/7 contact phone numbers for assistance for urgent and routine care needs. 3. Service will only be billed when office clinical staff spend 20 minutes or more in a month to coordinate care. 4. Only one practitioner may furnish and Teng the service in a calendar month. 5. The patient may stop CCM services at any time (effective at the end of the month) by phone call to the office staff. 6. The patient will be responsible for cost sharing (co-pay) of up to 20% of the service fee (after annual deductible is met).  Patient's wife Tye Maryland  agreed to services and verbal consent obtained.   Follow up plan: Telephone appointment with care management team member scheduled for:05/16/2020  Noreene Larsson, [place], [place], [place] [address] Direct Dial: [phone] [email or web address] Website: [email or web address]

## 2019-10-25 ENCOUNTER — Ambulatory Visit: Payer: Medicare HMO | Attending: Internal Medicine

## 2019-10-25 DIAGNOSIS — Z23 Encounter for immunization: Secondary | ICD-10-CM

## 2019-10-25 NOTE — Progress Notes (Signed)
   [facility]  Name:  Barry Batdorf Sr.    MRN: [id number] DOB: 07-02-1951  10/25/2019  [name] was observed post Covid-19 immunization for 15 minutes without incident. He was provided with Vaccine Information Sheet and instruction to access the V-Safe system.   [name] was instructed to call 911 with any severe reactions post vaccine: [name] Difficulty breathing  . Swelling of face and throat  . A fast heartbeat  . A bad rash all over body  . Dizziness and weakness   Immunizations Administered    Name Date Dose VIS Date Route   Moderna COVID-19 Vaccine 10/25/2019  1:07 PM 0.5 mL 06/28/2019 Intramuscular   Manufacturer: Moderna   Lot: [id number]   NDC: 29476-546-50

## 2019-11-09 ENCOUNTER — Other Ambulatory Visit: Payer: Self-pay | Admitting: Family

## 2019-11-09 DIAGNOSIS — E559 Vitamin D deficiency, unspecified: Secondary | ICD-10-CM

## 2019-11-10 ENCOUNTER — Other Ambulatory Visit: Payer: Self-pay | Admitting: Family

## 2020-03-01 ENCOUNTER — Other Ambulatory Visit: Payer: Self-pay | Admitting: Family

## 2020-03-01 DIAGNOSIS — E1142 Type 2 diabetes mellitus with diabetic polyneuropathy: Secondary | ICD-10-CM

## 2020-03-01 DIAGNOSIS — E1165 Type 2 diabetes mellitus with hyperglycemia: Secondary | ICD-10-CM

## 2020-03-20 ENCOUNTER — Other Ambulatory Visit: Payer: Self-pay | Admitting: Family

## 2020-03-30 ENCOUNTER — Encounter: Payer: Self-pay | Admitting: Family

## 2020-03-30 ENCOUNTER — Other Ambulatory Visit: Payer: Self-pay

## 2020-03-30 ENCOUNTER — Ambulatory Visit (INDEPENDENT_AMBULATORY_CARE_PROVIDER_SITE_OTHER): Payer: Medicare HMO | Admitting: Family

## 2020-03-30 VITALS — BP 130/70 | HR 61 | Temp 96.9°F | Ht 66.0 in | Wt 180.2 lb

## 2020-03-30 DIAGNOSIS — N401 Enlarged prostate with lower urinary tract symptoms: Secondary | ICD-10-CM | POA: Diagnosis not present

## 2020-03-30 DIAGNOSIS — E663 Overweight: Secondary | ICD-10-CM | POA: Diagnosis not present

## 2020-03-30 DIAGNOSIS — E1169 Type 2 diabetes mellitus with other specified complication: Secondary | ICD-10-CM

## 2020-03-30 DIAGNOSIS — E785 Hyperlipidemia, unspecified: Secondary | ICD-10-CM | POA: Diagnosis not present

## 2020-03-30 DIAGNOSIS — E1165 Type 2 diabetes mellitus with hyperglycemia: Secondary | ICD-10-CM

## 2020-03-30 DIAGNOSIS — E1159 Type 2 diabetes mellitus with other circulatory complications: Secondary | ICD-10-CM | POA: Diagnosis not present

## 2020-03-30 DIAGNOSIS — I10 Essential (primary) hypertension: Secondary | ICD-10-CM | POA: Diagnosis not present

## 2020-03-30 DIAGNOSIS — E559 Vitamin D deficiency, unspecified: Secondary | ICD-10-CM

## 2020-03-30 DIAGNOSIS — E1142 Type 2 diabetes mellitus with diabetic polyneuropathy: Secondary | ICD-10-CM | POA: Diagnosis not present

## 2020-03-30 DIAGNOSIS — G4700 Insomnia, unspecified: Secondary | ICD-10-CM | POA: Diagnosis not present

## 2020-03-30 DIAGNOSIS — I152 Hypertension secondary to endocrine disorders: Secondary | ICD-10-CM

## 2020-03-30 LAB — BAYER DCA HB A1C WAIVED: HB A1C (BAYER DCA - WAIVED): 6.8 % (ref <7.0)

## 2020-03-30 MED ORDER — ATORVASTATIN CALCIUM 40 MG PO TABS: ORAL_TABLET | ORAL | 2 refills | Status: DC

## 2020-03-30 MED ORDER — GLIMEPIRIDE 2 MG PO TABS: 2.0000 mg | ORAL_TABLET | Freq: Every morning | ORAL | 0 refills | Status: DC

## 2020-03-30 MED ORDER — GABAPENTIN 300 MG PO CAPS: ORAL_CAPSULE | ORAL | 3 refills | Status: DC

## 2020-03-30 MED ORDER — METFORMIN HCL 500 MG PO TABS: ORAL_TABLET | ORAL | 4 refills | Status: DC

## 2020-03-30 MED ORDER — SITAGLIPTIN PHOSPHATE 50 MG PO TABS: ORAL_TABLET | ORAL | 3 refills | Status: DC

## 2020-03-30 MED ORDER — TAMSULOSIN HCL 0.4 MG PO CAPS: 0.4000 mg | ORAL_CAPSULE | Freq: Every day | ORAL | 4 refills | Status: DC

## 2020-03-30 MED ORDER — LOSARTAN POTASSIUM 100 MG PO TABS: 100.0000 mg | ORAL_TABLET | Freq: Every day | ORAL | 4 refills | Status: DC

## 2020-03-30 MED ORDER — VITAMIN D (ERGOCALCIFEROL) 1.25 MG (50000 UNIT) PO CAPS: ORAL_CAPSULE | ORAL | 4 refills | Status: DC

## 2020-03-30 NOTE — Patient Instructions (Signed)
COVID-19 COVID-19 is a respiratory infection that is caused by a virus called severe acute respiratory syndrome coronavirus 2 (SARS-CoV-2). The disease is also known as coronavirus disease or novel coronavirus. In some people, the virus may not cause any symptoms. In others, it may cause a serious infection. The infection can get worse quickly and can lead to complications, such as:  Pneumonia, or infection of the lungs.  Acute respiratory distress syndrome or ARDS. This is a condition in which fluid build-up in the lungs prevents the lungs from filling with air and passing oxygen into the blood.  Acute respiratory failure. This is a condition in which there is not enough oxygen passing from the lungs to the body or when carbon dioxide is not passing from the lungs out of the body.  Sepsis or septic shock. This is a serious bodily reaction to an infection.  Blood clotting problems.  Secondary infections due to bacteria or fungus.  Organ failure. This is when your body's organs stop working. The virus that causes COVID-19 is contagious. This means that it can spread from person to person through droplets from coughs and sneezes (respiratory secretions). What are the causes? This illness is caused by a virus. You may catch the virus by:  Breathing in droplets from an infected person. Droplets can be spread by a person breathing, speaking, singing, coughing, or sneezing.  Touching something, like a table or a doorknob, that was exposed to the virus (contaminated) and then touching your mouth, nose, or eyes. What increases the risk? Risk for infection You are more likely to be infected with this virus if you:  Are within 6 feet (2 meters) of a person with COVID-19.  Provide care for or live with a person who is infected with COVID-19.  Spend time in crowded indoor spaces or live in shared housing. Risk for serious illness You are more likely to become seriously ill from the virus if you:   Are 50 years of age or older. The higher your age, the more you are at risk for serious illness.  Live in a nursing home or long-term care facility.  Have cancer.  Have a long-term (chronic) disease such as: ? Chronic lung disease, including chronic obstructive pulmonary disease or asthma. ? A long-term disease that lowers your body's ability to fight infection (immunocompromised). ? Heart disease, including heart failure, a condition in which the arteries that lead to the heart become narrow or blocked (coronary artery disease), a disease which makes the heart muscle thick, weak, or stiff (cardiomyopathy). ? Diabetes. ? Chronic kidney disease. ? Sickle cell disease, a condition in which red blood cells have an abnormal "sickle" shape. ? Liver disease.  Are obese. What are the signs or symptoms? Symptoms of this condition can range from mild to severe. Symptoms may appear any time from 2 to 14 days after being exposed to the virus. They include:  A fever or chills.  A cough.  Difficulty breathing.  Headaches, body aches, or muscle aches.  Runny or stuffy (congested) nose.  A sore throat.  New loss of taste or smell. Some people may also have stomach problems, such as nausea, vomiting, or diarrhea. Other people may not have any symptoms of COVID-19. How is this diagnosed? This condition may be diagnosed based on:  Your signs and symptoms, especially if: ? You live in an area with a COVID-19 outbreak. ? You recently traveled to or from an area where the virus is common. ? You   provide care for or live with a person who was diagnosed with COVID-19. ? You were exposed to a person who was diagnosed with COVID-19.  A physical exam.  Lab tests, which may include: ? Taking a sample of fluid from the back of your nose and throat (nasopharyngeal fluid), your nose, or your throat using a swab. ? A sample of mucus from your lungs (sputum). ? Blood tests.  Imaging tests, which  may include, X-rays, CT scan, or ultrasound. How is this treated? At present, there is no medicine to treat COVID-19. Medicines that treat other diseases are being used on a trial basis to see if they are effective against COVID-19. Your health care provider will talk with you about ways to treat your symptoms. For most people, the infection is mild and can be managed at home with rest, fluids, and over-the-counter medicines. Treatment for a serious infection usually takes places in a hospital intensive care unit (ICU). It may include one or more of the following treatments. These treatments are given until your symptoms improve.  Receiving fluids and medicines through an IV.  Supplemental oxygen. Extra oxygen is given through a tube in the nose, a face mask, or a hood.  Positioning you to lie on your stomach (prone position). This makes it easier for oxygen to get into the lungs.  Continuous positive airway pressure (CPAP) or bi-level positive airway pressure (BPAP) machine. This treatment uses mild air pressure to keep the airways open. A tube that is connected to a motor delivers oxygen to the body.  Ventilator. This treatment moves air into and out of the lungs by using a tube that is placed in your windpipe.  Tracheostomy. This is a procedure to create a hole in the neck so that a breathing tube can be inserted.  Extracorporeal membrane oxygenation (ECMO). This procedure gives the lungs a chance to recover by taking over the functions of the heart and lungs. It supplies oxygen to the body and removes carbon dioxide. Follow these instructions at home: Lifestyle  If you are sick, stay home except to get medical care. Your health care provider will tell you how long to stay home. Call your health care provider before you go for medical care.  Rest at home as told by your health care provider.  Do not use any products that contain nicotine or tobacco, such as cigarettes, e-cigarettes, and  chewing tobacco. If you need help quitting, ask your health care provider.  Return to your normal activities as told by your health care provider. Ask your health care provider what activities are safe for you. General instructions  Take over-the-counter and prescription medicines only as told by your health care provider.  Drink enough fluid to keep your urine pale yellow.  Keep all follow-up visits as told by your health care provider. This is important. How is this prevented?  There is no vaccine to help prevent COVID-19 infection. However, there are steps you can take to protect yourself and others from this virus. To protect yourself:   Do not travel to areas where COVID-19 is a risk. The areas where COVID-19 is reported change often. To identify high-risk areas and travel restrictions, check the CDC travel website: wwwnc.cdc.gov/travel/notices  If you live in, or must travel to, an area where COVID-19 is a risk, take precautions to avoid infection. ? Stay away from people who are sick. ? Wash your hands often with soap and water for 20 seconds. If soap and water   are not available, use an alcohol-based hand sanitizer. ? Avoid touching your mouth, face, eyes, or nose. ? Avoid going out in public, follow guidance from your state and local health authorities. ? If you must go out in public, wear a cloth face covering or face mask. Make sure your mask covers your nose and mouth. ? Avoid crowded indoor spaces. Stay at least 6 feet (2 meters) away from others. ? Disinfect objects and surfaces that are frequently touched every day. This may include:  Counters and tables.  Doorknobs and light switches.  Sinks and faucets.  Electronics, such as phones, remote controls, keyboards, computers, and tablets. To protect others: If you have symptoms of COVID-19, take steps to prevent the virus from spreading to others.  If you think you have a COVID-19 infection, contact your health care  provider right away. Tell your health care team that you think you may have a COVID-19 infection.  Stay home. Leave your house only to seek medical care. Do not use public transport.  Do not travel while you are sick.  Wash your hands often with soap and water for 20 seconds. If soap and water are not available, use alcohol-based hand sanitizer.  Stay away from other members of your household. Let healthy household members care for children and pets, if possible. If you have to care for children or pets, wash your hands often and wear a mask. If possible, stay in your own room, separate from others. Use a different bathroom.  Make sure that all people in your household wash their hands well and often.  Cough or sneeze into a tissue or your sleeve or elbow. Do not cough or sneeze into your hand or into the air.  Wear a cloth face covering or face mask. Make sure your mask covers your nose and mouth. Where to find more information  Centers for Disease Control and Prevention: www.cdc.gov/coronavirus/2019-ncov/index.html  World Health Organization: www.who.int/health-topics/coronavirus Contact a health care provider if:  You live in or have traveled to an area where COVID-19 is a risk and you have symptoms of the infection.  You have had contact with someone who has COVID-19 and you have symptoms of the infection. Get help right away if:  You have trouble breathing.  You have pain or pressure in your chest.  You have confusion.  You have bluish lips and fingernails.  You have difficulty waking from sleep.  You have symptoms that get worse. These symptoms may represent a serious problem that is an emergency. Do not wait to see if the symptoms will go away. Get medical help right away. Call your local emergency services (911 in the [place] Do not drive yourself to the hospital. Let the emergency medical personnel know if you think you have COVID-19. Summary  COVID-19 is a  respiratory infection that is caused by a virus. It is also known as coronavirus disease or novel coronavirus. It can cause serious infections, such as pneumonia, acute respiratory distress syndrome, acute respiratory failure, or sepsis.  The virus that causes COVID-19 is contagious. This means that it can spread from person to person through droplets from breathing, speaking, singing, coughing, or sneezing.  You are more likely to develop a serious illness if you are 50 years of age or older, have a weak immune system, live in a nursing home, or have chronic disease.  There is no medicine to treat COVID-19. Your health care provider will talk with you about ways to treat your symptoms.    Take steps to protect yourself and others from infection. Wash your hands often and disinfect objects and surfaces that are frequently touched every day. Stay away from people who are sick and wear a mask if you are sick. This information is not intended to replace advice given to you by your health care provider. Make sure you discuss any questions you have with your health care provider. Document Revised: 05/13/2019 Document Reviewed: 08/19/2018 Elsevier Patient Education  2020 [employer]  

## 2020-03-30 NOTE — Progress Notes (Signed)
Subjective:    Patient ID: Barry Smith., male    DOB: Jul 20, 1951, 69 y.o.   MRN: [id number]  Chief Complaint  Patient presents with  . Medical Management of Chronic Issues    fasting  . Hypertension  . Diabetes   PT presents to the office today for chronic follow up. Hypertension This is a chronic problem. The current episode started more than 1 year ago. The problem has been resolved since onset. The problem is controlled. Pertinent negatives include no blurred vision, malaise/fatigue, peripheral edema or shortness of breath. Risk factors for coronary artery disease include dyslipidemia, diabetes mellitus and male gender. There is no history of CVA or heart failure.  Diabetes He presents for his follow-up diabetic visit. He has type 2 diabetes mellitus. There are no hypoglycemic associated symptoms. Pertinent negatives for diabetes include no blurred vision and no foot paresthesias. Symptoms are stable. Pertinent negatives for diabetic complications include no CVA, heart disease, nephropathy or peripheral neuropathy. Risk factors for coronary artery disease include dyslipidemia, diabetes mellitus, male sex and hypertension. He is following a generally healthy diet. His overall blood glucose range is 110-130 mg/dl. An ACE inhibitor/angiotensin II receptor blocker is being taken.  Hyperlipidemia This is a chronic problem. The current episode started more than 1 year ago. The problem is controlled. Recent lipid tests were reviewed and are normal. Exacerbating diseases include obesity. Pertinent negatives include no shortness of breath. Current antihyperlipidemic treatment includes statins. The current treatment provides moderate improvement of lipids. Risk factors for coronary artery disease include dyslipidemia, diabetes mellitus, male sex, a sedentary lifestyle and hypertension.  Benign Prostatic Hypertrophy This is a chronic problem. The current episode started more than 1 year ago.  The problem has been waxing and waning since onset. Irritative symptoms include nocturia.  Insomnia Primary symptoms: difficulty falling asleep, frequent awakening, no malaise/fatigue.  The current episode started more than one year. The problem occurs intermittently. The problem has been waxing and waning since onset.      Review of Systems  Constitutional: Negative for malaise/fatigue.  Eyes: Negative for blurred vision.  Respiratory: Negative for shortness of breath.   Genitourinary: Positive for nocturia.  Psychiatric/Behavioral: The patient has insomnia.   All other systems reviewed and are negative.      Objective:   Physical Exam Vitals reviewed.  Constitutional:      General: He is not in acute distress.    Appearance: He is well-developed. He is obese.  HENT:     Head: Normocephalic.     Right Ear: Tympanic membrane normal.     Left Ear: Tympanic membrane normal.  Eyes:     General:        Right eye: No discharge.        Left eye: No discharge.     Pupils: Pupils are equal, round, and reactive to light.  Neck:     Thyroid: No thyromegaly.  Cardiovascular:     Rate and Rhythm: Normal rate and regular rhythm.     Heart sounds: Normal heart sounds. No murmur heard.   Pulmonary:     Effort: Pulmonary effort is normal. No respiratory distress.     Breath sounds: Normal breath sounds. No wheezing.  Abdominal:     General: Bowel sounds are normal. There is no distension.     Palpations: Abdomen is soft.     Tenderness: There is no abdominal tenderness.  Musculoskeletal:        General: No tenderness. Normal  range of motion.     Cervical back: Normal range of motion and neck supple.  Skin:    General: Skin is warm and dry.     Findings: No erythema or rash.  Neurological:     Mental Status: He is alert and oriented to person, place, and time.     Cranial Nerves: No cranial nerve deficit.     Deep Tendon Reflexes: Reflexes are normal and symmetric.    Psychiatric:        Behavior: Behavior normal.        Thought Content: Thought content normal.        Judgment: Judgment normal.       BP 130/70   Pulse 61   Temp (!) 96.9 F (36.1 C) (Temporal)   Ht 5' 6"  (1.676 m)   Wt 180 lb 3.2 oz (81.7 kg)   SpO2 100%   BMI 29.09 kg/m      Assessment & Plan:  Barry Doe Sr. comes in today with chief complaint of Medical Management of Chronic Issues (fasting), Hypertension, and Diabetes   Diagnosis and orders addressed:  1. Benign prostatic hyperplasia with lower urinary tract symptoms, symptom details unspecified - tamsulosin (FLOMAX) 0.4 MG CAPS capsule; Take 1 capsule (0.4 mg total) by mouth daily.  Dispense: 90 capsule; Refill: 4 - CMP14+EGFR - CBC with Differential/Platelet  2. Diabetic polyneuropathy associated with type 2 diabetes mellitus (HCC) - gabapentin (NEURONTIN) 300 MG capsule; TAKE 1 CAPSULE THREE TIMES DAILY  Dispense: 270 capsule; Refill: 3 - sitaGLIPtin (JANUVIA) 50 MG tablet; TAKE ONE (1) TABLET EACH DAY  Dispense: 90 tablet; Refill: 3 - metFORMIN (GLUCOPHAGE) 500 MG tablet; Take 2 tablets (1,000 mg total) by mouth every morning AND 3 tablets (1,500 mg total) at bedtime.  Dispense: 450 tablet; Refill: 4 - glimepiride (AMARYL) 2 MG tablet; Take 1 tablet (2 mg total) by mouth every morning.  Dispense: 90 tablet; Refill: 0 - CMP14+EGFR - CBC with Differential/Platelet  3. Type 2 diabetes mellitus with hyperglycemia, without long-term current use of insulin (HCC) - sitaGLIPtin (JANUVIA) 50 MG tablet; TAKE ONE (1) TABLET EACH DAY  Dispense: 90 tablet; Refill: 3 - metFORMIN (GLUCOPHAGE) 500 MG tablet; Take 2 tablets (1,000 mg total) by mouth every morning AND 3 tablets (1,500 mg total) at bedtime.  Dispense: 450 tablet; Refill: 4 - glimepiride (AMARYL) 2 MG tablet; Take 1 tablet (2 mg total) by mouth every morning.  Dispense: 90 tablet; Refill: 0 - CMP14+EGFR - CBC with Differential/Platelet - Bayer DCA Hb A1c  Waived  4. Hypertension associated with diabetes ([place]) - losartan (COZAAR) 100 MG tablet; Take 1 tablet (100 mg total) by mouth daily.  Dispense: 90 tablet; Refill: 4 - CMP14+EGFR - CBC with Differential/Platelet  5. Vitamin D deficiency - Vitamin D, Ergocalciferol, (DRISDOL) 1.25 MG (50000 UNIT) CAPS capsule; TAKE 1 CAPSULE EVERY WEEK  Dispense: 12 capsule; Refill: 4 - CMP14+EGFR - CBC with Differential/Platelet  6. Hyperlipidemia associated with type 2 diabetes mellitus (HCC) - atorvastatin (LIPITOR) 40 MG tablet; TAKE ONE (1) TABLET EACH DAY  Dispense: 90 tablet; Refill: 2 - CMP14+EGFR - CBC with Differential/Platelet  7. Insomnia, unspecified type - CMP14+EGFR - CBC with Differential/Platelet  8. Overweight (BMI 25.0-29.9) - CMP14+EGFR - CBC with Differential/Platelet   Labs pending Health Maintenance reviewed Diet and exercise encouraged  Follow up plan: 4 months    Evelina Dun, FNP

## 2020-03-31 LAB — CMP14+EGFR
ALT: 14 IU/L (ref 0–44)
AST: 13 IU/L (ref 0–40)
Albumin/Globulin Ratio: 2 (ref 1.2–2.2)
Albumin: 4.7 g/dL (ref 3.8–4.8)
Alkaline Phosphatase: 57 IU/L (ref 48–121)
BUN/Creatinine Ratio: 13 (ref 10–24)
BUN: 12 mg/dL (ref 8–27)
Bilirubin Total: 0.2 mg/dL (ref 0.0–1.2)
CO2: 26 mmol/L (ref 20–29)
Calcium: 9.9 mg/dL (ref 8.6–10.2)
Chloride: 104 mmol/L (ref 96–106)
Creatinine, Ser: 0.93 mg/dL (ref 0.76–1.27)
GFR calc Af Amer: 96 mL/min/{1.73_m2} (ref >59)
GFR calc non Af Amer: 83 mL/min/{1.73_m2} (ref >59)
Globulin, Total: 2.4 g/dL (ref 1.5–4.5)
Glucose: 122 mg/dL — ABNORMAL HIGH (ref 65–99)
Potassium: 4.8 mmol/L (ref 3.5–5.2)
Sodium: 142 mmol/L (ref 134–144)
Total Protein: 7.1 g/dL (ref 6.0–8.5)

## 2020-03-31 LAB — CBC WITH DIFFERENTIAL/PLATELET
Basophils Absolute: 0 10*3/uL (ref 0.0–0.2)
Basos: 1 %
EOS (ABSOLUTE): 0.1 10*3/uL (ref 0.0–0.4)
Eos: 3 %
Hematocrit: 38.6 % (ref 37.5–51.0)
Hemoglobin: 12.6 g/dL — ABNORMAL LOW (ref 13.0–17.7)
Immature Grans (Abs): 0 10*3/uL (ref 0.0–0.1)
Immature Granulocytes: 0 %
Lymphocytes Absolute: 2.1 10*3/uL (ref 0.7–3.1)
Lymphs: 40 %
MCH: 28.3 pg (ref 26.6–33.0)
MCHC: 32.6 g/dL (ref 31.5–35.7)
MCV: 87 fL (ref 79–97)
Monocytes Absolute: 0.3 10*3/uL (ref 0.1–0.9)
Monocytes: 7 %
Neutrophils Absolute: 2.6 10*3/uL (ref 1.4–7.0)
Neutrophils: 49 %
Platelets: 284 10*3/uL (ref 150–450)
RBC: 4.45 x10E6/uL (ref 4.14–5.80)
RDW: 12.8 % (ref 11.6–15.4)
WBC: 5.2 10*3/uL (ref 3.4–10.8)

## 2020-04-06 ENCOUNTER — Telehealth: Payer: Self-pay | Admitting: Family

## 2020-04-06 NOTE — Telephone Encounter (Signed)
Patient aware of labs. Verbal understanding. 

## 2020-05-16 ENCOUNTER — Telehealth: Payer: Self-pay | Admitting: *Deleted

## 2020-05-16 ENCOUNTER — Telehealth: Payer: Medicare HMO | Admitting: *Deleted

## 2020-05-17 NOTE — Telephone Encounter (Addendum)
  Chronic Care Management   Outreach Note  05/16/2020 Name: Barry Chadderdon Sr. MRN: [id number] DOB: Dec 02, 1950  Referred by: Junie Spencer, FNP Reason for referral : No chief complaint on file.   An unsuccessful Initial Telephone Visit was attempted today. The patient was referred to the case management team for assistance with care management and care coordination.   Clinical Goals: . Over the next 10 days, patient will be contacted by a Care Guide to reschedule their Initial CCM Visit . Over the next 30 days, patient will have an Initial CCM Visit with a member of the embedded CCM team to discuss self-management of their chronic medical conditions  Interventions and Plan . Chart reviewed in preparation for initial visit telephone call . Collaboration with other care team members as needed . Unsuccessful outreach to patient  . A HIPAA compliant phone message was left for the patient providing contact information and requesting a return call.  An appointment reminder call and voicemail message was left prior to appt date.  . Request sent to care guides to reach out and reschedule patient's initial visit   Demetrios Loll, BSN, RN-BC Embedded Chronic Care Manager [facility] / [facility] Care Management Direct Dial: [phone]

## 2020-06-06 ENCOUNTER — Ambulatory Visit (INDEPENDENT_AMBULATORY_CARE_PROVIDER_SITE_OTHER): Payer: Medicare HMO

## 2020-06-06 DIAGNOSIS — Z23 Encounter for immunization: Secondary | ICD-10-CM

## 2020-06-27 ENCOUNTER — Other Ambulatory Visit: Payer: Self-pay | Admitting: Family

## 2020-06-27 DIAGNOSIS — E559 Vitamin D deficiency, unspecified: Secondary | ICD-10-CM

## 2020-06-27 DIAGNOSIS — E1142 Type 2 diabetes mellitus with diabetic polyneuropathy: Secondary | ICD-10-CM

## 2020-06-28 ENCOUNTER — Other Ambulatory Visit: Payer: Self-pay | Admitting: *Deleted

## 2020-06-28 MED ORDER — TRUEPLUS LANCETS 33G MISC: 3 refills | Status: DC

## 2020-06-28 MED ORDER — TRUE METRIX AIR GLUCOSE METER W/DEVICE KIT: PACK | 0 refills | Status: DC

## 2020-06-28 MED ORDER — TRUE METRIX BLOOD GLUCOSE TEST VI STRP: ORAL_STRIP | 3 refills | Status: DC

## 2020-06-29 ENCOUNTER — Other Ambulatory Visit: Payer: Self-pay | Admitting: *Deleted

## 2020-06-29 MED ORDER — TRUE METRIX LEVEL 1 LOW VI SOLN: 0 refills | Status: DC

## 2020-06-29 NOTE — Telephone Encounter (Signed)
Spoke to spouse Lynden Ang rescheduled for 2nd attempt outreach with Demetrios Loll RN CM 07/26/2020

## 2020-06-29 NOTE — Telephone Encounter (Signed)
R/s

## 2020-07-02 ENCOUNTER — Other Ambulatory Visit: Payer: Self-pay

## 2020-07-02 MED ORDER — TRUE METRIX BLOOD GLUCOSE TEST VI STRP
ORAL_STRIP | 3 refills | Status: DC
Start: 2020-07-02 — End: 2021-05-06

## 2020-07-02 MED ORDER — TRUE METRIX AIR GLUCOSE METER W/DEVICE KIT
PACK | 0 refills | Status: DC
Start: 2020-07-02 — End: 2021-04-25

## 2020-07-02 MED ORDER — TRUEPLUS LANCETS 33G MISC
3 refills | Status: DC
Start: 2020-07-02 — End: 2023-03-25

## 2020-07-26 ENCOUNTER — Telehealth: Payer: Medicare HMO | Admitting: *Deleted

## 2020-07-30 ENCOUNTER — Encounter: Payer: Self-pay | Admitting: Family

## 2020-07-30 ENCOUNTER — Ambulatory Visit (INDEPENDENT_AMBULATORY_CARE_PROVIDER_SITE_OTHER): Payer: Medicare HMO | Admitting: Family

## 2020-07-30 ENCOUNTER — Other Ambulatory Visit: Payer: Self-pay

## 2020-07-30 VITALS — BP 140/66 | HR 67 | Temp 97.6°F | Ht 66.0 in | Wt 190.2 lb

## 2020-07-30 DIAGNOSIS — E785 Hyperlipidemia, unspecified: Secondary | ICD-10-CM | POA: Diagnosis not present

## 2020-07-30 DIAGNOSIS — I152 Hypertension secondary to endocrine disorders: Secondary | ICD-10-CM | POA: Diagnosis not present

## 2020-07-30 DIAGNOSIS — E1165 Type 2 diabetes mellitus with hyperglycemia: Secondary | ICD-10-CM

## 2020-07-30 DIAGNOSIS — E1159 Type 2 diabetes mellitus with other circulatory complications: Secondary | ICD-10-CM | POA: Diagnosis not present

## 2020-07-30 DIAGNOSIS — N401 Enlarged prostate with lower urinary tract symptoms: Secondary | ICD-10-CM | POA: Diagnosis not present

## 2020-07-30 DIAGNOSIS — E1169 Type 2 diabetes mellitus with other specified complication: Secondary | ICD-10-CM | POA: Diagnosis not present

## 2020-07-30 DIAGNOSIS — E559 Vitamin D deficiency, unspecified: Secondary | ICD-10-CM | POA: Diagnosis not present

## 2020-07-30 DIAGNOSIS — E1142 Type 2 diabetes mellitus with diabetic polyneuropathy: Secondary | ICD-10-CM | POA: Diagnosis not present

## 2020-07-30 DIAGNOSIS — Z0001 Encounter for general adult medical examination with abnormal findings: Secondary | ICD-10-CM

## 2020-07-30 DIAGNOSIS — Z0000 Encounter for general adult medical examination without abnormal findings: Secondary | ICD-10-CM

## 2020-07-30 LAB — BAYER DCA HB A1C WAIVED: HB A1C (BAYER DCA - WAIVED): 6.5 % (ref <7.0)

## 2020-07-30 MED ORDER — TAMSULOSIN HCL 0.4 MG PO CAPS: 0.4000 mg | ORAL_CAPSULE | Freq: Every day | ORAL | 4 refills | Status: DC

## 2020-07-30 MED ORDER — GABAPENTIN 300 MG PO CAPS: 300.0000 mg | ORAL_CAPSULE | Freq: Three times a day (TID) | ORAL | 3 refills | Status: DC

## 2020-07-30 MED ORDER — LOSARTAN POTASSIUM 100 MG PO TABS: 100.0000 mg | ORAL_TABLET | Freq: Every day | ORAL | 4 refills | Status: DC

## 2020-07-30 MED ORDER — GLIMEPIRIDE 2 MG PO TABS: 2.0000 mg | ORAL_TABLET | Freq: Every morning | ORAL | 3 refills | Status: DC

## 2020-07-30 MED ORDER — ATORVASTATIN CALCIUM 40 MG PO TABS: ORAL_TABLET | ORAL | 2 refills | Status: DC

## 2020-07-30 MED ORDER — SITAGLIPTIN PHOSPHATE 50 MG PO TABS: ORAL_TABLET | ORAL | 3 refills | Status: DC

## 2020-07-30 MED ORDER — VITAMIN D (ERGOCALCIFEROL) 1.25 MG (50000 UNIT) PO CAPS: ORAL_CAPSULE | ORAL | 4 refills | Status: DC

## 2020-07-30 MED ORDER — METFORMIN HCL 500 MG PO TABS: ORAL_TABLET | ORAL | 4 refills | Status: DC

## 2020-07-30 NOTE — Patient Instructions (Signed)

## 2020-07-30 NOTE — Progress Notes (Signed)
Subjective:    Patient ID: Barry Smith., male    DOB: 09-03-1950, 70 y.o.   MRN: [id number]  Chief Complaint  Patient presents with  . Medical Management of Chronic Issues  . Hypertension  . Diabetes   PT presents to the office today for CPE and chronic follow up. Hypertension This is a chronic problem. The current episode started more than 1 year ago. The problem has been resolved since onset. The problem is controlled. Pertinent negatives include no blurred vision, malaise/fatigue, peripheral edema or shortness of breath. Risk factors for coronary artery disease include dyslipidemia, diabetes mellitus, obesity and sedentary lifestyle. The current treatment provides moderate improvement. There is no history of CVA or heart failure.  Diabetes He presents for his follow-up diabetic visit. He has type 2 diabetes mellitus. His disease course has been stable. There are no hypoglycemic associated symptoms. Associated symptoms include foot paresthesias. Pertinent negatives for diabetes include no blurred vision. There are no hypoglycemic complications. Symptoms are stable. Diabetic complications include peripheral neuropathy. Pertinent negatives for diabetic complications include no CVA. Risk factors for coronary artery disease include dyslipidemia, diabetes mellitus, male sex, sedentary lifestyle and hypertension. (Does not check BS at home )  Insomnia Primary symptoms: difficulty falling asleep, no malaise/fatigue.  The current episode started more than one year. The onset quality is gradual. The problem occurs intermittently. The problem has been waxing and waning since onset.  Benign Prostatic Hypertrophy This is a chronic problem. The current episode started more than 1 year ago. The problem has been waxing and waning since onset. Irritative symptoms include frequency, nocturia (1) and urgency.  Hyperlipidemia This is a chronic problem. The current episode started more than 1 year  ago. Exacerbating diseases include obesity. Pertinent negatives include no shortness of breath. Current antihyperlipidemic treatment includes statins. The current treatment provides moderate improvement of lipids. Risk factors for coronary artery disease include dyslipidemia, male sex, hypertension and a sedentary lifestyle.   Diabetic Neuropathy States his pain is 0 out 10 as long as he takes his gabapentin 300 mg TID.   Review of Systems  Constitutional: Negative for malaise/fatigue.  Eyes: Negative for blurred vision.  Respiratory: Negative for shortness of breath.   Genitourinary: Positive for frequency, nocturia (1) and urgency.  Psychiatric/Behavioral: The patient has insomnia.   All other systems reviewed and are negative.  Family History  Problem Relation Age of Onset  . Diabetes Father   . Diabetes Sister   . Pneumonia Brother   . Diabetes Sister   . Diabetes Sister   . Diabetes Sister   . Diabetes Sister   . Diabetes Sister   . Cancer Sister   . Diabetes Brother   . Diabetes Brother   . Diabetes Brother   . Diabetes Brother   . Diabetes Brother    Social History   Socioeconomic History  . Marital status: Single    Spouse name: Not on file  . Number of children: Not on file  . Years of education: Not on file  . Highest education level: Not on file  Occupational History  . Not on file  Tobacco Use  . Smoking status: Never Smoker  . Smokeless tobacco: Never Used  Vaping Use  . Vaping Use: Never used  Substance and Sexual Activity  . Alcohol use: No  . Drug use: No  . Sexual activity: Yes  Other Topics Concern  . Not on file  Social History Narrative  . Not on file  Social Determinants of Health   Financial Resource Strain: Not on file  Food Insecurity: Not on file  Transportation Needs: Not on file  Physical Activity: Not on file  Stress: Not on file  Social Connections: Not on file       Objective:   Physical Exam Vitals reviewed.   Constitutional:      General: He is not in acute distress.    Appearance: He is well-developed and well-nourished.  HENT:     Head: Normocephalic.     Right Ear: Tympanic membrane normal.     Left Ear: Tympanic membrane normal.     Mouth/Throat:     Mouth: Oropharynx is clear and moist.  Eyes:     General:        Right eye: No discharge.        Left eye: No discharge.     Pupils: Pupils are equal, round, and reactive to light.  Neck:     Thyroid: No thyromegaly.  Cardiovascular:     Rate and Rhythm: Normal rate and regular rhythm.     Pulses: Intact distal pulses.     Heart sounds: Normal heart sounds. No murmur heard.   Pulmonary:     Effort: Pulmonary effort is normal. No respiratory distress.     Breath sounds: Normal breath sounds. No wheezing.  Abdominal:     General: Bowel sounds are normal. There is no distension.     Palpations: Abdomen is soft.     Tenderness: There is no abdominal tenderness.  Musculoskeletal:        General: No tenderness or edema. Normal range of motion.     Cervical back: Normal range of motion and neck supple.  Skin:    General: Skin is warm and dry.     Findings: No erythema or rash.  Neurological:     Mental Status: He is alert and oriented to person, place, and time.     Cranial Nerves: No cranial nerve deficit.     Deep Tendon Reflexes: Reflexes are normal and symmetric.  Psychiatric:        Mood and Affect: Mood and affect normal.        Behavior: Behavior normal.        Thought Content: Thought content normal.        Judgment: Judgment normal.       BP 140/66   Pulse 67   Temp 97.6 F (36.4 C) (Temporal)   Ht 5' 6" (1.676 m)   Wt 190 lb 3.2 oz (86.3 kg)   BMI 30.70 kg/m      Assessment & Plan:  Chaddrick Brue Sr. comes in today with chief complaint of Medical Management of Chronic Issues, Hypertension, and Diabetes   Diagnosis and orders addressed:  1. Benign prostatic hyperplasia with lower urinary tract  symptoms, symptom details unspecified - tamsulosin (FLOMAX) 0.4 MG CAPS capsule; Take 1 capsule (0.4 mg total) by mouth daily.  Dispense: 90 capsule; Refill: 4 - CMP14+EGFR - CBC with Differential/Platelet  2. Diabetic polyneuropathy associated with type 2 diabetes mellitus (HCC) - gabapentin (NEURONTIN) 300 MG capsule; Take 1 capsule (300 mg total) by mouth 3 (three) times daily.  Dispense: 270 capsule; Refill: 3 - sitaGLIPtin (JANUVIA) 50 MG tablet; TAKE ONE (1) TABLET EACH DAY  Dispense: 90 tablet; Refill: 3 - metFORMIN (GLUCOPHAGE) 500 MG tablet; Take 2 tablets (1,000 mg total) by mouth every morning AND 3 tablets (1,500 mg total) at bedtime.  Dispense: 450 tablet;  Refill: 4 - glimepiride (AMARYL) 2 MG tablet; Take 1 tablet (2 mg total) by mouth every morning.  Dispense: 90 tablet; Refill: 3 - CMP14+EGFR - CBC with Differential/Platelet  3. Type 2 diabetes mellitus with hyperglycemia, without long-term current use of insulin (HCC) - sitaGLIPtin (JANUVIA) 50 MG tablet; TAKE ONE (1) TABLET EACH DAY  Dispense: 90 tablet; Refill: 3 - metFORMIN (GLUCOPHAGE) 500 MG tablet; Take 2 tablets (1,000 mg total) by mouth every morning AND 3 tablets (1,500 mg total) at bedtime.  Dispense: 450 tablet; Refill: 4 - glimepiride (AMARYL) 2 MG tablet; Take 1 tablet (2 mg total) by mouth every morning.  Dispense: 90 tablet; Refill: 3 - CMP14+EGFR - CBC with Differential/Platelet - Bayer DCA Hb A1c Waived  4. Hyperlipidemia associated with type 2 diabetes mellitus (HCC) - atorvastatin (LIPITOR) 40 MG tablet; TAKE ONE (1) TABLET EACH DAY  Dispense: 90 tablet; Refill: 2 - CMP14+EGFR - CBC with Differential/Platelet  5. Hypertension associated with diabetes ([place]) - losartan (COZAAR) 100 MG tablet; Take 1 tablet (100 mg total) by mouth daily.  Dispense: 90 tablet; Refill: 4 - CMP14+EGFR - CBC with Differential/Platelet  6. Vitamin D deficiency - Vitamin D, Ergocalciferol, (DRISDOL) 1.25 MG (50000 UNIT) CAPS  capsule; TAKE 1 CAPSULE EVERY WEEK  Dispense: 12 capsule; Refill: 4 - CMP14+EGFR - CBC with Differential/Platelet  7. Annual physical exam - CMP14+EGFR - CBC with Differential/Platelet - Bayer DCA Hb A1c Waived - Microalbumin / creatinine urine ratio - Lipid panel - PSA, total and free - TSH   Labs pending Health Maintenance reviewed Diet and exercise encouraged  Follow up plan: 4 months   Evelina Dun, FNP

## 2020-07-31 ENCOUNTER — Telehealth: Payer: Self-pay

## 2020-07-31 ENCOUNTER — Telehealth: Payer: Self-pay | Admitting: *Deleted

## 2020-07-31 DIAGNOSIS — E559 Vitamin D deficiency, unspecified: Secondary | ICD-10-CM

## 2020-07-31 LAB — CMP14+EGFR
ALT: 12 IU/L (ref 0–44)
AST: 13 IU/L (ref 0–40)
Albumin/Globulin Ratio: 2.1 (ref 1.2–2.2)
Albumin: 4.5 g/dL (ref 3.8–4.8)
Alkaline Phosphatase: 54 IU/L (ref 44–121)
BUN/Creatinine Ratio: 10 (ref 10–24)
BUN: 9 mg/dL (ref 8–27)
Bilirubin Total: 0.2 mg/dL (ref 0.0–1.2)
CO2: 25 mmol/L (ref 20–29)
Calcium: 9.5 mg/dL (ref 8.6–10.2)
Chloride: 105 mmol/L (ref 96–106)
Creatinine, Ser: 0.91 mg/dL (ref 0.76–1.27)
GFR calc Af Amer: 99 mL/min/{1.73_m2} (ref >59)
GFR calc non Af Amer: 86 mL/min/{1.73_m2} (ref >59)
Globulin, Total: 2.1 g/dL (ref 1.5–4.5)
Glucose: 132 mg/dL — ABNORMAL HIGH (ref 65–99)
Potassium: 4.1 mmol/L (ref 3.5–5.2)
Sodium: 143 mmol/L (ref 134–144)
Total Protein: 6.6 g/dL (ref 6.0–8.5)

## 2020-07-31 LAB — CBC WITH DIFFERENTIAL/PLATELET
Basophils Absolute: 0 10*3/uL (ref 0.0–0.2)
Basos: 1 %
EOS (ABSOLUTE): 0.1 10*3/uL (ref 0.0–0.4)
Eos: 2 %
Hematocrit: 37.7 % (ref 37.5–51.0)
Hemoglobin: 12.4 g/dL — ABNORMAL LOW (ref 13.0–17.7)
Immature Grans (Abs): 0 10*3/uL (ref 0.0–0.1)
Immature Granulocytes: 0 %
Lymphocytes Absolute: 1.9 10*3/uL (ref 0.7–3.1)
Lymphs: 34 %
MCH: 27.9 pg (ref 26.6–33.0)
MCHC: 32.9 g/dL (ref 31.5–35.7)
MCV: 85 fL (ref 79–97)
Monocytes Absolute: 0.4 10*3/uL (ref 0.1–0.9)
Monocytes: 7 %
Neutrophils Absolute: 3.2 10*3/uL (ref 1.4–7.0)
Neutrophils: 56 %
Platelets: 294 10*3/uL (ref 150–450)
RBC: 4.45 x10E6/uL (ref 4.14–5.80)
RDW: 13.1 % (ref 11.6–15.4)
WBC: 5.7 10*3/uL (ref 3.4–10.8)

## 2020-07-31 LAB — MICROALBUMIN / CREATININE URINE RATIO
Creatinine, Urine: 148.9 mg/dL
Microalb/Creat Ratio: 17 mg/g creat (ref 0–29)
Microalbumin, Urine: 25.4 ug/mL

## 2020-07-31 LAB — LIPID PANEL
Chol/HDL Ratio: 3.4 ratio (ref 0.0–5.0)
Cholesterol, Total: 117 mg/dL (ref 100–199)
HDL: 34 mg/dL — ABNORMAL LOW (ref >39)
LDL Chol Calc (NIH): 66 mg/dL (ref 0–99)
Triglycerides: 89 mg/dL (ref 0–149)
VLDL Cholesterol Cal: 17 mg/dL (ref 5–40)

## 2020-07-31 LAB — TSH: TSH: 3.72 u[IU]/mL (ref 0.450–4.500)

## 2020-07-31 LAB — PSA, TOTAL AND FREE
PSA, Free Pct: 18.6 %
PSA, Free: 0.26 ng/mL
Prostate Specific Ag, Serum: 1.4 ng/mL (ref 0.0–4.0)

## 2020-07-31 NOTE — Telephone Encounter (Signed)
Wife called and is wondering if you will re-prescribe Lovaza 2 grams bid and Vitamin D 50,000 units weekly.  He had been on both in past but has not had prescription in a while so she has been buying OTC instead.  Please advise.

## 2020-07-31 NOTE — Telephone Encounter (Signed)
  Chronic Care Management   Outreach Note  07/26/20 Name: Barry Sprinkle Sr. MRN: [id number] DOB: Feb 05, 1951  Referred by: Barry Spencer, FNP Reason for referral : Chronic Care Management (2nd Initial Visit attempt)   A 2nd unsuccessful Initial Telephone Visit was attempted today. The patient was referred to the case management team for assistance with care management and care coordination.   Clinical Goals: . Over the next 30 days, patient will be contacted by a Care Guide to reschedule their Initial CCM Visit  Interventions and Plan . Chart reviewed in preparation for initial visit telephone call . Collaboration with other care team members as needed . Unsuccessful outreach to patient  . A HIPAA compliant phone message was left for the patient providing contact information and requesting a return call.  . Request sent to care guides to reach out and reschedule patient's initial visit   Demetrios Loll, BSN, RN-BC Embedded Chronic Care Manager [facility] / [facility] Care Management Direct Dial: [phone]

## 2020-08-01 ENCOUNTER — Telehealth: Payer: Self-pay | Admitting: *Deleted

## 2020-08-01 NOTE — Telephone Encounter (Signed)
Please r/s

## 2020-08-01 NOTE — Chronic Care Management (AMB) (Signed)
  Care Management   Note  08/01/2020 Name: Barry Parada Sr. MRN: [id number] DOB: [birth year]  Barry Maine Sr. is a 70 y.o. year old male who is a primary care patient of Junie Spencer, FNP and is actively engaged with the care management team. I reached out to Barry Maine Sr. by phone today to assist with re-scheduling an initial visit with the Pharmacist  Follow up plan: Unsuccessful telephone outreach attempt made. A HIPAA compliant phone message was left for the patient providing contact information and requesting a return call. The care management team will reach out to the patient again over the next 7 days. If patient returns call to provider office, please advise to call Embedded Care Management Care Guide Gwenevere Ghazi at [phone].  Gwenevere Ghazi  Care Guide, Embedded Care Coordination [facility] Management

## 2020-08-02 MED ORDER — OMEGA-3-ACID ETHYL ESTERS 1 G PO CAPS: 2.0000 g | ORAL_CAPSULE | Freq: Two times a day (BID) | ORAL | 2 refills | Status: DC

## 2020-08-02 MED ORDER — VITAMIN D (ERGOCALCIFEROL) 1.25 MG (50000 UNIT) PO CAPS: ORAL_CAPSULE | ORAL | 4 refills | Status: DC

## 2020-08-02 NOTE — Addendum Note (Signed)
Addended by: Jannifer Rodney A on: 08/02/2020 09:55 AM   Modules accepted: Orders

## 2020-08-02 NOTE — Telephone Encounter (Signed)
Prescriptions sent to pharmacy

## 2020-08-02 NOTE — Telephone Encounter (Signed)
Patient aware.

## 2020-08-07 NOTE — Chronic Care Management (AMB) (Signed)
  Care Management   Note  08/07/2020 Name: Barry Robinette Sr. MRN: [id number] DOB: 1950-10-03  Barry Maine Sr. is a 70 y.o. year old male who is a primary care patient of Junie Spencer, FNP and is actively engaged with the care management team. I reached out to Barry Maine Sr. by phone today to assist with re-scheduling an initial visit with the RN Case Manager  Follow up plan: Unsuccessful telephone outreach attempt made. A HIPAA compliant phone message was left for the patient providing contact information and requesting a return call. The care management team will reach out to the patient again over the next 7 days.  If patient returns call to provider office, please advise to call Embedded Care Management Care Guide Gwenevere Ghazi at [phone].  Gwenevere Ghazi  Care Guide, Embedded Care Coordination [facility] Management

## 2020-08-13 NOTE — Telephone Encounter (Signed)
3 unsuccessful outreach attempts to reschedule initial call. I messaged provider to make her aware and removed you from the care TEAM.  

## 2020-08-13 NOTE — Chronic Care Management (AMB) (Signed)
  Care Management   Note  08/13/2020 Name: Barry Janssen Sr. MRN: [id number] DOB: July 21, 1951  Barry Maine Sr. is a 70 y.o. year old male who is a primary care patient of Junie Spencer, FNP and is actively engaged with the care management team. I reached out to Barry Maine Sr. by phone today to assist with re-scheduling an initial visit with the RN Case Manager.  Follow up plan: Unsuccessful telephone outreach attempt made. A HIPAA compliant phone message was left for the patient providing contact information and requesting a return call. Unable to make contact on outreach attempts x 3. PCP, Junie Spencer, FNP notified via routed documentation in medical record. The care management team is available to follow up with the patient after provider conversation with the patient regarding recommendation for care management engagement and subsequent re-referral to the care management team.   [facility] Guide, Embedded Care Coordination [facility]  Care Management

## 2020-08-15 ENCOUNTER — Ambulatory Visit (INDEPENDENT_AMBULATORY_CARE_PROVIDER_SITE_OTHER): Payer: Medicare HMO

## 2020-08-15 ENCOUNTER — Other Ambulatory Visit: Payer: Self-pay

## 2020-08-15 DIAGNOSIS — Z23 Encounter for immunization: Secondary | ICD-10-CM

## 2020-08-15 NOTE — Progress Notes (Signed)
   [id number]  Name:  Barry Stratton Sr.    MRN: [id number] DOB: 1951/01/22  08/15/2020  [name] was observed post Covid-19 immunization for 15 minutes without incident. He was provided with Vaccine Information Sheet and instruction to access the V-Safe system.   [name] was instructed to call 911 with any severe reactions post vaccine: [name] Difficulty breathing  . Swelling of face and throat  . A fast heartbeat  . A bad rash all over body  . Dizziness and weakness   Immunizations Administered    Name Date Dose VIS Date Route   Moderna Covid-19 Booster Vaccine 08/15/2020  9:20 AM 0.25 mL 05/16/2020 Intramuscular   Manufacturer: Moderna   Lot: [id number]   NDC: 54492-010-07

## 2020-09-06 ENCOUNTER — Other Ambulatory Visit: Payer: Self-pay | Admitting: Family

## 2020-09-06 DIAGNOSIS — E1142 Type 2 diabetes mellitus with diabetic polyneuropathy: Secondary | ICD-10-CM

## 2020-11-28 ENCOUNTER — Ambulatory Visit (INDEPENDENT_AMBULATORY_CARE_PROVIDER_SITE_OTHER): Payer: Medicare HMO | Admitting: Family

## 2020-11-28 ENCOUNTER — Encounter: Payer: Self-pay | Admitting: Family

## 2020-11-28 ENCOUNTER — Other Ambulatory Visit: Payer: Self-pay

## 2020-11-28 VITALS — BP 108/69 | HR 94 | Temp 97.8°F | Ht 66.0 in | Wt 180.4 lb

## 2020-11-28 DIAGNOSIS — I152 Hypertension secondary to endocrine disorders: Secondary | ICD-10-CM

## 2020-11-28 DIAGNOSIS — E1165 Type 2 diabetes mellitus with hyperglycemia: Secondary | ICD-10-CM

## 2020-11-28 DIAGNOSIS — G4700 Insomnia, unspecified: Secondary | ICD-10-CM | POA: Diagnosis not present

## 2020-11-28 DIAGNOSIS — N401 Enlarged prostate with lower urinary tract symptoms: Secondary | ICD-10-CM | POA: Diagnosis not present

## 2020-11-28 DIAGNOSIS — E1159 Type 2 diabetes mellitus with other circulatory complications: Secondary | ICD-10-CM | POA: Diagnosis not present

## 2020-11-28 DIAGNOSIS — E1142 Type 2 diabetes mellitus with diabetic polyneuropathy: Secondary | ICD-10-CM

## 2020-11-28 DIAGNOSIS — E1169 Type 2 diabetes mellitus with other specified complication: Secondary | ICD-10-CM | POA: Diagnosis not present

## 2020-11-28 DIAGNOSIS — E785 Hyperlipidemia, unspecified: Secondary | ICD-10-CM | POA: Diagnosis not present

## 2020-11-28 DIAGNOSIS — E663 Overweight: Secondary | ICD-10-CM | POA: Diagnosis not present

## 2020-11-28 DIAGNOSIS — E559 Vitamin D deficiency, unspecified: Secondary | ICD-10-CM | POA: Diagnosis not present

## 2020-11-28 LAB — CMP14+EGFR
ALT: 20 IU/L (ref 0–44)
AST: 15 IU/L (ref 0–40)
Albumin/Globulin Ratio: 2.1 (ref 1.2–2.2)
Albumin: 4.9 g/dL — ABNORMAL HIGH (ref 3.8–4.8)
Alkaline Phosphatase: 65 IU/L (ref 44–121)
BUN/Creatinine Ratio: 12 (ref 10–24)
BUN: 13 mg/dL (ref 8–27)
Bilirubin Total: 0.3 mg/dL (ref 0.0–1.2)
CO2: 23 mmol/L (ref 20–29)
Calcium: 10.1 mg/dL (ref 8.6–10.2)
Chloride: 105 mmol/L (ref 96–106)
Creatinine, Ser: 1.06 mg/dL (ref 0.76–1.27)
Globulin, Total: 2.3 g/dL (ref 1.5–4.5)
Glucose: 145 mg/dL — ABNORMAL HIGH (ref 65–99)
Potassium: 4.4 mmol/L (ref 3.5–5.2)
Sodium: 144 mmol/L (ref 134–144)
Total Protein: 7.2 g/dL (ref 6.0–8.5)
eGFR: 75 mL/min/1.73

## 2020-11-28 LAB — BAYER DCA HB A1C WAIVED: HB A1C (BAYER DCA - WAIVED): 6.8 %

## 2020-11-28 NOTE — Progress Notes (Signed)
Subjective:    Patient ID: Barry Smith., male    DOB: [birth year], 70 y.o.   MRN: [id number]  Chief Complaint  Patient presents with  . Diabetes   PT presents to the office today for chronic follow up. Diabetes He presents for his follow-up diabetic visit. He has type 2 diabetes mellitus. His disease course has been stable. There are no hypoglycemic associated symptoms. Associated symptoms include foot paresthesias. Pertinent negatives for diabetes include no blurred vision. Symptoms are stable. Diabetic complications include heart disease and peripheral neuropathy. Risk factors for coronary artery disease include dyslipidemia, diabetes mellitus, male sex, hypertension and sedentary lifestyle. He is following a generally healthy diet. His overall blood glucose range is 110-130 mg/dl. An ACE inhibitor/angiotensin II receptor blocker is being taken. Eye exam is not current.  Hyperlipidemia This is a chronic problem. The current episode started more than 1 year ago. The problem is controlled. Exacerbating diseases include obesity. Pertinent negatives include no shortness of breath. Current antihyperlipidemic treatment includes statins. The current treatment provides moderate improvement of lipids. Risk factors for coronary artery disease include diabetes mellitus, dyslipidemia, male sex, hypertension and a sedentary lifestyle.  Hypertension This is a chronic problem. The current episode started more than 1 year ago. The problem has been resolved since onset. The problem is controlled. Associated symptoms include malaise/fatigue. Pertinent negatives include no blurred vision, peripheral edema or shortness of breath. Risk factors for coronary artery disease include dyslipidemia, diabetes mellitus, obesity and male gender. The current treatment provides moderate improvement.  Benign Prostatic Hypertrophy This is a chronic problem. The current episode started more than 1 year ago. The problem has  been resolved since onset. Irritative symptoms include nocturia.  Insomnia Primary symptoms: difficulty falling asleep, frequent awakening, malaise/fatigue.  The current episode started more than one year. The onset quality is gradual. The problem occurs intermittently.      Review of Systems  Constitutional: Positive for malaise/fatigue.  Eyes: Negative for blurred vision.  Respiratory: Negative for shortness of breath.   Genitourinary: Positive for nocturia.  Psychiatric/Behavioral: The patient has insomnia.   All other systems reviewed and are negative.      Objective:   Physical Exam Vitals reviewed.  Constitutional:      General: He is not in acute distress.    Appearance: He is well-developed.  HENT:     Head: Normocephalic.     Right Ear: Tympanic membrane normal.     Left Ear: Tympanic membrane normal.  Eyes:     General:        Right eye: No discharge.        Left eye: No discharge.     Pupils: Pupils are equal, round, and reactive to light.  Neck:     Thyroid: No thyromegaly.  Cardiovascular:     Rate and Rhythm: Normal rate and regular rhythm.     Heart sounds: Normal heart sounds. No murmur heard.   Pulmonary:     Effort: Pulmonary effort is normal. No respiratory distress.     Breath sounds: Normal breath sounds. No wheezing.  Abdominal:     General: Bowel sounds are normal. There is no distension.     Palpations: Abdomen is soft.     Tenderness: There is no abdominal tenderness.  Musculoskeletal:        General: No tenderness. Normal range of motion.     Cervical back: Normal range of motion and neck supple.  Skin:    General: Skin  is warm and dry.     Findings: No erythema or rash.  Neurological:     Mental Status: He is alert and oriented to person, place, and time.     Cranial Nerves: No cranial nerve deficit.     Deep Tendon Reflexes: Reflexes are normal and symmetric.  Psychiatric:        Behavior: Behavior normal.        Thought Content:  Thought content normal.        Judgment: Judgment normal.      BP 108/69   Pulse 94   Temp 97.8 F (36.6 C) (Temporal)   Ht 5' 6"  (1.676 m)   Wt 180 lb 6.4 oz (81.8 kg)   BMI 29.12 kg/m      Assessment & Plan:  [name] comes in today with chief complaint of Diabetes   Diagnosis and orders addressed:  1. Hypertension associated with diabetes ([place]) - CMP14+EGFR  2. Type 2 diabetes mellitus with hyperglycemia, without long-term current use of insulin ([facility]) - Bayer DCA Hb A1c Waived - CMP14+EGFR  3. Benign prostatic hyperplasia with lower urinary tract symptoms, symptom details unspecified - CMP14+EGFR  4. Hyperlipidemia associated with type 2 diabetes mellitus ([facility]) - CMP14+EGFR  5. Diabetic polyneuropathy associated with type 2 diabetes mellitus ([facility]) - CMP14+EGFR  6. Insomnia, unspecified type - CMP14+EGFR  7. Overweight (BMI 25.0-29.9)  - CMP14+EGFR  8. Vitamin D deficiency - CMP14+EGFR   Labs pending Health Maintenance reviewed Diet and exercise encouraged  Follow up plan: 6 months    Evelina Dun, FNP

## 2020-11-28 NOTE — Patient Instructions (Signed)
Health Maintenance After Age 70 After age 70, you are at a higher risk for certain long-term diseases and infections as well as injuries from falls. Falls are a major cause of broken bones and head injuries in people who are older than age 70. Getting regular preventive care can help to keep you healthy and well. Preventive care includes getting regular testing and making lifestyle changes as recommended by your health care provider. Talk with your health care provider about:  Which screenings and tests you should have. A screening is a test that checks for a disease when you have no symptoms.  A diet and exercise plan that is right for you. What should I know about screenings and tests to prevent falls? Screening and testing are the best ways to find a health problem early. Early diagnosis and treatment give you the best chance of managing medical conditions that are common after age 70. Certain conditions and lifestyle choices may make you more likely to have a fall. Your health care provider may recommend:  Regular vision checks. Poor vision and conditions such as cataracts can make you more likely to have a fall. If you wear glasses, make sure to get your prescription updated if your vision changes.  Medicine review. Work with your health care provider to regularly review all of the medicines you are taking, including over-the-counter medicines. Ask your health care provider about any side effects that may make you more likely to have a fall. Tell your health care provider if any medicines that you take make you feel dizzy or sleepy.  Osteoporosis screening. Osteoporosis is a condition that causes the bones to get weaker. This can make the bones weak and cause them to break more easily.  Blood pressure screening. Blood pressure changes and medicines to control blood pressure can make you feel dizzy.  Strength and balance checks. Your health care provider may recommend certain tests to check your  strength and balance while standing, walking, or changing positions.  Foot health exam. Foot pain and numbness, as well as not wearing proper footwear, can make you more likely to have a fall.  Depression screening. You may be more likely to have a fall if you have a fear of falling, feel emotionally low, or feel unable to do activities that you used to do.  Alcohol use screening. Using too much alcohol can affect your balance and may make you more likely to have a fall. What actions can I take to lower my risk of falls? General instructions  Talk with your health care provider about your risks for falling. Tell your health care provider if: ? You fall. Be sure to tell your health care provider about all falls, even ones that seem minor. ? You feel dizzy, sleepy, or off-balance.  Take over-the-counter and prescription medicines only as told by your health care provider. These include any supplements.  Eat a healthy diet and maintain a healthy weight. A healthy diet includes low-fat dairy products, low-fat (lean) meats, and fiber from whole grains, beans, and lots of fruits and vegetables. Home safety  Remove any tripping hazards, such as rugs, cords, and clutter.  Install safety equipment such as grab bars in bathrooms and safety rails on stairs.  Keep rooms and walkways well-lit. Activity  Follow a regular exercise program to stay fit. This will help you maintain your balance. Ask your health care provider what types of exercise are appropriate for you.  If you need a cane or walker,   use it as recommended by your health care provider.  Wear supportive shoes that have nonskid soles.   Lifestyle  Do not drink alcohol if your health care provider tells you not to drink.  If you drink alcohol, limit how much you have: ? 0-1 drink a day for women. ? 0-2 drinks a day for men.  Be aware of how much alcohol is in your drink. In the [place] one drink equals one typical bottle of beer (12  oz), one-half glass of wine (5 oz), or one shot of hard liquor (1 oz).  Do not use any products that contain nicotine or tobacco, such as cigarettes and e-cigarettes. If you need help quitting, ask your health care provider. Summary  Having a healthy lifestyle and getting preventive care can help to protect your health and wellness after age 70.  Screening and testing are the best way to find a health problem early and help you avoid having a fall. Early diagnosis and treatment give you the best chance for managing medical conditions that are more common for people who are older than age 70.  Falls are a major cause of broken bones and head injuries in people who are older than age 70. Take precautions to prevent a fall at home.  Work with your health care provider to learn what changes you can make to improve your health and wellness and to prevent falls. This information is not intended to replace advice given to you by your health care provider. Make sure you discuss any questions you have with your health care provider. Document Revised: 11/04/2018 Document Reviewed: 05/27/2017 Elsevier Patient Education  2021 Elsevier Inc.  

## 2020-12-03 ENCOUNTER — Encounter: Payer: Self-pay | Admitting: Family Medicine

## 2020-12-05 ENCOUNTER — Encounter: Payer: Self-pay | Admitting: Family Medicine

## 2021-01-15 ENCOUNTER — Telehealth: Payer: Self-pay | Admitting: Family

## 2021-01-15 NOTE — Telephone Encounter (Signed)
Pt wants to know if Focus extra strength for memory would be ok to give the pt or if there is anything else otc that you might recommend.Please advise.

## 2021-01-15 NOTE — Telephone Encounter (Signed)
Left message on VM regarding provider feedback and to call back with any further questions or concerns.

## 2021-01-15 NOTE — Telephone Encounter (Signed)
Should be ok to take.   Sencere Symonette, FNP  

## 2021-01-15 NOTE — Telephone Encounter (Signed)
Pt's wife would like to discuss an over the counter medicine to make sure that they will not infer with other medications that pt is on.

## 2021-04-04 ENCOUNTER — Telehealth: Payer: Self-pay | Admitting: Family

## 2021-04-04 NOTE — Telephone Encounter (Signed)
LVM for pt to rtn my call to schedule AWV with [facility]. Please schedule this appt if pt calls the office.  Thanks  

## 2021-04-18 ENCOUNTER — Other Ambulatory Visit: Payer: Self-pay

## 2021-04-18 ENCOUNTER — Encounter: Payer: Self-pay | Admitting: Family

## 2021-04-18 ENCOUNTER — Ambulatory Visit (INDEPENDENT_AMBULATORY_CARE_PROVIDER_SITE_OTHER): Payer: Medicare HMO | Admitting: Family

## 2021-04-18 VITALS — BP 128/69 | HR 91 | Temp 98.3°F | Ht 66.0 in | Wt 177.4 lb

## 2021-04-18 DIAGNOSIS — H6121 Impacted cerumen, right ear: Secondary | ICD-10-CM | POA: Diagnosis not present

## 2021-04-18 DIAGNOSIS — H938X1 Other specified disorders of right ear: Secondary | ICD-10-CM

## 2021-04-18 NOTE — Progress Notes (Signed)
Subjective:    Patient ID: Barry Smith., male    DOB: 06-Mar-1951, 70 y.o.   MRN: [id number]  Chief Complaint  Patient presents with   Ear Pain    Right ear 2 weeks     Otalgia  There is pain in the right ear. This is a new problem. The current episode started 1 to 4 weeks ago. The problem occurs every few minutes. The problem has been unchanged. There has been no fever. The pain is at a severity of 2/10. The pain is moderate. Associated symptoms include coughing and ear discharge. Pertinent negatives include no headaches, hearing loss, rhinorrhea or sore throat. He has tried ear drops for the symptoms. The treatment provided mild relief.     Review of Systems  HENT:  Positive for ear discharge and ear pain. Negative for hearing loss, rhinorrhea and sore throat.   Respiratory:  Positive for cough.   Neurological:  Negative for headaches.  All other systems reviewed and are negative.     Objective:   Physical Exam Vitals reviewed.  Constitutional:      General: He is not in acute distress.    Appearance: He is well-developed.  HENT:     Head: Normocephalic.     Right Ear: There is impacted cerumen.     Left Ear: Tympanic membrane normal.  Eyes:     General:        Right eye: No discharge.        Left eye: No discharge.     Pupils: Pupils are equal, round, and reactive to light.  Neck:     Thyroid: No thyromegaly.  Cardiovascular:     Rate and Rhythm: Normal rate and regular rhythm.     Heart sounds: Normal heart sounds. No murmur heard. Pulmonary:     Effort: Pulmonary effort is normal. No respiratory distress.     Breath sounds: Normal breath sounds. No wheezing.  Abdominal:     General: Bowel sounds are normal. There is no distension.     Palpations: Abdomen is soft.     Tenderness: There is no abdominal tenderness.  Musculoskeletal:        General: No tenderness. Normal range of motion.     Cervical back: Normal range of motion and neck supple.  Skin:     General: Skin is warm and dry.     Findings: No erythema or rash.  Neurological:     Mental Status: He is alert and oriented to person, place, and time.     Cranial Nerves: No cranial nerve deficit.     Deep Tendon Reflexes: Reflexes are normal and symmetric.  Psychiatric:        Behavior: Behavior normal.        Thought Content: Thought content normal.        Judgment: Judgment normal.    Right cleaned with warm water and peroxide. TML WNL.Pt tolerated well.    BP 128/69   Pulse 91   Temp 98.3 F (36.8 C) (Temporal)   Ht 5\' 6"  (1.676 m)   Wt 177 lb 6.4 oz (80.5 kg)   BMI 28.63 kg/m      Assessment & Plan:  Sr. comes in today with chief complaint of Ear Pain (Right ear 2 weeks )   Diagnosis and orders addressed:  1. Sensation of fullness in right ear   2. Impacted cerumen of right ear Pain resolved once ear cleaned Use OTC drops  as needed Do not stick anything into ear  Jannifer Rodney, FNP

## 2021-04-18 NOTE — Patient Instructions (Signed)
Earwax Buildup, Adult ?The ears produce a substance called earwax that helps keep bacteria out of the ear and protects the skin in the ear canal. Occasionally, earwax can build up in the ear and cause discomfort or hearing loss. ?What are the causes? ?This condition is caused by a buildup of earwax. Ear canals are self-cleaning. Ear wax is made in the outer part of the ear canal and generally falls out in small amounts over time. ?When the self-cleaning mechanism is not working, earwax builds up and can cause decreased hearing and discomfort. Attempting to clean ears with cotton swabs can push the earwax deep into the ear canal and cause decreased hearing and pain. ?What increases the risk? ?This condition is more likely to develop in people who: ?Clean their ears often with cotton swabs. ?Pick at their ears. ?Use earplugs or in-ear headphones often, or wear hearing aids. ?The following factors may also make you more likely to develop this condition: ?Being male. ?Being of older age. ?Naturally producing more earwax. ?Having narrow ear canals. ?Having earwax that is overly thick or sticky. ?Having excess hair in the ear canal. ?Having eczema. ?Being dehydrated. ?What are the signs or symptoms? ?Symptoms of this condition include: ?Reduced or muffled hearing. ?A feeling of fullness in the ear or feeling that the ear is plugged. ?Fluid coming from the ear. ?Ear pain or an itchy ear. ?Ringing in the ear. ?Coughing. ?Balance problems. ?An obvious piece of earwax that can be seen inside the ear canal. ?How is this diagnosed? ?This condition may be diagnosed based on: ?Your symptoms. ?Your medical history. ?An ear exam. During the exam, your health care provider will look into your ear with an instrument called an otoscope. ?You may have tests, including a hearing test. ?How is this treated? ?This condition may be treated by: ?Using ear drops to soften the earwax. ?Having the earwax removed by a health care provider. The  health care provider may: ?Flush the ear with water. ?Use an instrument that has a loop on the end (curette). ?Use a suction device. ?Having surgery to remove the wax buildup. This may be done in severe cases. ?Follow these instructions at home: ? ?Take over-the-counter and prescription medicines only as told by your health care provider. ?Do not put any objects, including cotton swabs, into your ear. You can clean the opening of your ear canal with a washcloth or facial tissue. ?Follow instructions from your health care provider about cleaning your ears. Do not overclean your ears. ?Drink enough fluid to keep your urine pale yellow. This will help to thin the earwax. ?Keep all follow-up visits as told. If earwax builds up in your ears often or if you use hearing aids, consider seeing your health care provider for routine, preventive ear cleanings. Ask your health care provider how often you should schedule your cleanings. ?If you have hearing aids, clean them according to instructions from the manufacturer and your health care provider. ?Contact a health care provider if: ?You have ear pain. ?You develop a fever. ?You have pus or other fluid coming from your ear. ?You have hearing loss. ?You have ringing in your ears that does not go away. ?You feel like the room is spinning (vertigo). ?Your symptoms do not improve with treatment. ?Get help right away if: ?You have bleeding from the affected ear. ?You have severe ear pain. ?Summary ?Earwax can build up in the ear and cause discomfort or hearing loss. ?The most common symptoms of this condition include   reduced or muffled hearing, a feeling of fullness in the ear, or feeling that the ear is plugged. ?This condition may be diagnosed based on your symptoms, your medical history, and an ear exam. ?This condition may be treated by using ear drops to soften the earwax or by having the earwax removed by a health care provider. ?Do not put any objects, including cotton  swabs, into your ear. You can clean the opening of your ear canal with a washcloth or facial tissue. ?This information is not intended to replace advice given to you by your health care provider. Make sure you discuss any questions you have with your health care provider. ?Document Revised: 11/01/2019 Document Reviewed: 11/01/2019 ?Elsevier Patient Education ? 2022 Else[employer] Inc. ? ?

## 2021-04-19 ENCOUNTER — Telehealth: Payer: Self-pay | Admitting: Family

## 2021-04-19 MED ORDER — AMOXICILLIN-POT CLAVULANATE 875-125 MG PO TABS: 1.0000 | ORAL_TABLET | Freq: Two times a day (BID) | ORAL | 0 refills | Status: DC

## 2021-04-19 NOTE — Telephone Encounter (Signed)
  Incoming Patient Call  04/19/2021  What symptoms do you have? Cough fever and body aches  How long have you been sick? Started yesterday after ov  Have you been seen for this problem? No I offered a covid clinic apt for pt and he said he did not want to come in because he was just here yesterday and does not have covid.  If your provider decides to give you a prescription, which pharmacy would you like for it to be sent to? [facility]   Patient informed that this information will be sent to the clinical staff for review and that they should receive a follow up call.

## 2021-04-19 NOTE — Telephone Encounter (Signed)
Augmentin Prescription sent to pharmacy   

## 2021-04-22 ENCOUNTER — Ambulatory Visit (INDEPENDENT_AMBULATORY_CARE_PROVIDER_SITE_OTHER): Payer: Medicare HMO | Admitting: Family Medicine

## 2021-04-22 ENCOUNTER — Encounter: Payer: Self-pay | Admitting: Family Medicine

## 2021-04-22 VITALS — BP 125/71 | HR 89 | Temp 97.4°F | Ht 66.0 in | Wt 177.4 lb

## 2021-04-22 DIAGNOSIS — H66001 Acute suppurative otitis media without spontaneous rupture of ear drum, right ear: Secondary | ICD-10-CM

## 2021-04-22 DIAGNOSIS — R059 Cough, unspecified: Secondary | ICD-10-CM | POA: Diagnosis not present

## 2021-04-22 MED ORDER — BENZONATATE 200 MG PO CAPS: 200.0000 mg | ORAL_CAPSULE | Freq: Two times a day (BID) | ORAL | 0 refills | Status: DC | PRN

## 2021-04-22 NOTE — Progress Notes (Signed)
Subjective: CC: Cough, ear pain PCP: Sharion Balloon, FNP NLG:[id number] Barry Cotta Sr. is a 70 y.o. male presenting to clinic today for:  1.  Otalgia Patient had his ear cleaned out several days ago.  He notes he has had ongoing pain despite use of over-the-counter eardrops.  No fevers, chills.  He reports a cough that is not productive.  Has had multiple COVID-19 exposures would really like to get tested for that.   ROS: Per HPI  No Known Allergies Past Medical History:  Diagnosis Date   Anxiety    Essential hypertension    Hyperlipidemia    Type 2 diabetes mellitus (HCC)     Current Outpatient Medications:    amoxicillin-clavulanate (AUGMENTIN) 875-125 MG tablet, Take 1 tablet by mouth 2 (two) times daily., Disp: 14 tablet, Rfl: 0   aspirin 81 MG tablet, Take 81 mg by mouth daily., Disp: , Rfl:    atorvastatin (LIPITOR) 40 MG tablet, TAKE ONE (1) TABLET EACH DAY, Disp: 90 tablet, Rfl: 2   benzonatate (TESSALON) 200 MG capsule, Take 1 capsule (200 mg total) by mouth 2 (two) times daily as needed for cough., Disp: 20 capsule, Rfl: 0   Blood Glucose Calibration (TRUE METRIX LEVEL 1) Low SOLN, Use with glucose monitor Dx E11.9, Disp: 3 each, Rfl: 0   blood glucose meter kit and supplies, Dispense based on patient and insurance preference. Use up to four times daily as directed. (FOR ICD-10 E10.9, E11.9)., Disp: 1 each, Rfl: 0   Blood Glucose Monitoring Suppl (TRUE METRIX AIR GLUCOSE METER) w/Device KIT, Test BS four times daily Dx E11.9, Disp: 1 kit, Rfl: 0   Blood Glucose Monitoring Suppl (TRUE METRIX METER) w/Device KIT, Test BS four times daily Dx E11.9, Disp: 1 kit, Rfl: 0   cholecalciferol (VITAMIN D) 1000 UNITS tablet, Take 1 tablet (1,000 Units total) by mouth daily., Disp: 90 tablet, Rfl: 3   gabapentin (NEURONTIN) 300 MG capsule, TAKE 1 CAPSULE THREE TIMES DAILY, Disp: 270 capsule, Rfl: 2   glimepiride (AMARYL) 2 MG tablet, Take 1 tablet (2 mg total) by mouth every morning.,  Disp: 90 tablet, Rfl: 3   glucose blood (TRUE METRIX BLOOD GLUCOSE TEST) test strip, Test BS four times daily Dx E11.9, Disp: 400 each, Rfl: 3   losartan (COZAAR) 100 MG tablet, Take 1 tablet (100 mg total) by mouth daily., Disp: 90 tablet, Rfl: 4   metFORMIN (GLUCOPHAGE) 500 MG tablet, Take 2 tablets (1,000 mg total) by mouth every morning AND 3 tablets (1,500 mg total) at bedtime., Disp: 450 tablet, Rfl: 4   Multiple Vitamin (MULTIVITAMIN) capsule, Take 1 capsule by mouth daily., Disp: , Rfl:    omega-3 acid ethyl esters (LOVAZA) 1 g capsule, Take 2 capsules (2 g total) by mouth 2 (two) times daily., Disp: 180 capsule, Rfl: 2   Omega-3 Fatty Acids (FISH OIL) 1200 MG CAPS, Take 2 capsules by mouth daily. , Disp: , Rfl:    sitaGLIPtin (JANUVIA) 50 MG tablet, TAKE ONE (1) TABLET EACH DAY, Disp: 90 tablet, Rfl: 3   tamsulosin (FLOMAX) 0.4 MG CAPS capsule, Take 1 capsule (0.4 mg total) by mouth daily., Disp: 90 capsule, Rfl: 4   TRUEplus Lancets 33G MISC, Test BS four times daily Dx E11.9, Disp: 400 each, Rfl: 3   Vitamin D, Ergocalciferol, (DRISDOL) 1.25 MG (50000 UNIT) CAPS capsule, TAKE 1 CAPSULE EVERY WEEK, Disp: 12 capsule, Rfl: 4 Social History   Socioeconomic History   Marital status: Single  Spouse name: Not on file   Number of children: Not on file   Years of education: Not on file   Highest education level: Not on file  Occupational History   Not on file  Tobacco Use   Smoking status: Never   Smokeless tobacco: Never  Vaping Use   Vaping Use: Never used  Substance and Sexual Activity   Alcohol use: No   Drug use: No   Sexual activity: Yes  Other Topics Concern   Not on file  Social History Narrative   Not on file   Social Determinants of Health   Financial Resource Strain: Not on file  Food Insecurity: Not on file  Transportation Needs: Not on file  Physical Activity: Not on file  Stress: Not on file  Social Connections: Not on file  Intimate Partner Violence: Not  on file   Family History  Problem Relation Age of Onset   Diabetes Father    Diabetes Sister    Pneumonia Brother    Diabetes Sister    Diabetes Sister    Diabetes Sister    Diabetes Sister    Diabetes Sister    Cancer Sister    Diabetes Brother    Diabetes Brother    Diabetes Brother    Diabetes Brother    Diabetes Brother     Objective: Office vital signs reviewed. BP 125/71   Pulse 89   Temp (!) 97.4 F (36.3 C)   Ht 5' 6" (1.676 m)   Wt 177 lb 6.4 oz (80.5 kg)   SpO2 98%   BMI 28.63 kg/m   Physical Examination:  General: Awake, alert, nontoxic, No acute distress HEENT: Sclera white; right TM with mild erythema but he has quite a bit of purulent material within the external auditory canal. Cardio: regular rate and rhythm, S1S2 heard, no murmurs appreciated Pulm: clear to auscultation bilaterally, no wheezes, rhonchi or rales; normal work of breathing on room air; coughing intermittently  Assessment/ Plan: 70 y.o. male   Cough - Plan: Novel Coronavirus, NAA ([facility]), benzonatate (TESSALON) 200 MG capsule  Non-recurrent acute suppurative otitis media of right ear without spontaneous rupture of tympanic membrane  Test for COVID-19.  Tessalon Perles provided.  Work note provided.  Certainly looks like he has a right ear infection.  It looks like his PCP is already called him in antibiotics but he was unaware of this.  I advised him to pick this up and start immediately.  Orders Placed This Encounter  Procedures   Novel Coronavirus, NAA ([facility])    Order Specific Question:   Is this test for diagnosis or screening    Answer:   Diagnosis of ill patient    Order Specific Question:   Symptomatic for COVID-19 as defined by CDC    Answer:   Yes    Order Specific Question:   Date of Symptom Onset    Answer:   04/18/2021    Order Specific Question:   Hospitalized for COVID-19    Answer:   No    Order Specific Question:   Admitted to ICU for COVID-19    Answer:    No    Order Specific Question:   Previously tested for COVID-19    Answer:   No    Order Specific Question:   Resident in a congregate (group) care setting    Answer:   No    Order Specific Question:   Is the patient student?    Answer:  No    Order Specific Question:   Employed in healthcare setting    Answer:   No    Order Specific Question:   Has patient completed COVID vaccination(s) (2 doses of Pfizer/Moderna 1 dose of [place])    Answer:   Yes    Order Specific Question:   Has patient completed COVID Booster / 3rd dose    Answer:   Yes    Order Specific Question:   Release to patient    Answer:   Immediate   Meds ordered this encounter  Medications   benzonatate (TESSALON) 200 MG capsule    Sig: Take 1 capsule (200 mg total) by mouth 2 (two) times daily as needed for cough.    Dispense:  20 capsule    Refill:  [place], DO [place] [phone]

## 2021-04-23 ENCOUNTER — Other Ambulatory Visit: Payer: Self-pay | Admitting: Family Medicine

## 2021-04-23 DIAGNOSIS — U071 COVID-19: Secondary | ICD-10-CM

## 2021-04-23 LAB — SARS-COV-2, NAA 2 DAY TAT

## 2021-04-23 LAB — NOVEL CORONAVIRUS, NAA: SARS-CoV-2, NAA: DETECTED — AB

## 2021-04-23 MED ORDER — MOLNUPIRAVIR EUA 200MG CAPSULE: 4.0000 | ORAL_CAPSULE | Freq: Two times a day (BID) | ORAL | 0 refills | Status: AC

## 2021-04-25 ENCOUNTER — Other Ambulatory Visit: Payer: Self-pay | Admitting: *Deleted

## 2021-04-25 MED ORDER — TRUE METRIX METER W/DEVICE KIT: PACK | 0 refills | Status: DC

## 2021-05-06 ENCOUNTER — Other Ambulatory Visit: Payer: Self-pay | Admitting: *Deleted

## 2021-05-06 MED ORDER — TRUE METRIX BLOOD GLUCOSE TEST VI STRP: ORAL_STRIP | 3 refills | Status: DC

## 2021-05-20 ENCOUNTER — Ambulatory Visit (INDEPENDENT_AMBULATORY_CARE_PROVIDER_SITE_OTHER): Payer: Medicare HMO

## 2021-05-20 VITALS — Ht 66.0 in | Wt 156.0 lb

## 2021-05-20 DIAGNOSIS — Z0000 Encounter for general adult medical examination without abnormal findings: Secondary | ICD-10-CM | POA: Diagnosis not present

## 2021-05-20 NOTE — Patient Instructions (Signed)
Mr. Barry Smith , Thank you for taking time to come for your Medicare Wellness Visit. I appreciate your ongoing commitment to your health goals. Please review the following plan we discussed and let me know if I can assist you in the future.   Screening recommendations/referrals: Colonoscopy: Declined Recommended yearly ophthalmology/optometry visit for glaucoma screening and checkup Recommended yearly dental visit for hygiene and checkup  Vaccinations: Influenza vaccine: Done 06/06/2020 - Repeat annually Pneumococcal vaccine: Done 08/10/2014 & 01/22/2017 Tdap vaccine: Done 04/29/2017 - Repeat in 10 years- Shingles vaccine: Due   Covid-19: Done 09/27/2019, 10/25/2019, 08/15/2020 - may have had another booster - bring card please  Advanced directives: Please bring a copy of your health care power of attorney and living will to the office to be added to your chart at your convenience.   Conditions/risks identified: Aim for 30 minutes of exercise or brisk walking each day, drink 6-8 glasses of water and eat lots of fruits and vegetables.   Next appointment: Follow up in one year for your annual wellness visit.   Preventive Care 70 Years and Older, Male  Preventive care refers to lifestyle choices and visits with your health care provider that can promote health and wellness. What does preventive care include? A yearly physical exam. This is also called an annual well check. Dental exams once or twice a year. Routine eye exams. Ask your health care provider how often you should have your eyes checked. Personal lifestyle choices, including: Daily care of your teeth and gums. Regular physical activity. Eating a healthy diet. Avoiding tobacco and drug use. Limiting alcohol use. Practicing safe sex. Taking low doses of aspirin every day. Taking vitamin and mineral supplements as recommended by your health care provider. What happens during an annual well check? The services and screenings done by  your health care provider during your annual well check will depend on your age, overall health, lifestyle risk factors, and family history of disease. Counseling  Your health care provider may ask you questions about your: Alcohol use. Tobacco use. Drug use. Emotional well-being. Home and relationship well-being. Sexual activity. Eating habits. History of falls. Memory and ability to understand (cognition). Work and work Astronomer. Screening  You may have the following tests or measurements: Height, weight, and BMI. Blood pressure. Lipid and cholesterol levels. These may be checked every 5 years, or more frequently if you are over 70 years old. Skin check. Lung cancer screening. You may have this screening every year starting at age 23 if you have a 30-pack-year history of smoking and currently smoke or have quit within the past 15 years. Fecal occult blood test (FOBT) of the stool. You may have this test every year starting at age 9. Flexible sigmoidoscopy or colonoscopy. You may have a sigmoidoscopy every 5 years or a colonoscopy every 10 years starting at age 55. Prostate cancer screening. Recommendations will vary depending on your family history and other risks. Hepatitis C blood test. Hepatitis B blood test. Sexually transmitted disease (STD) testing. Diabetes screening. This is done by checking your blood sugar (glucose) after you have not eaten for a while (fasting). You may have this done every 1-3 years. Abdominal aortic aneurysm (AAA) screening. You may need this if you are a current or former smoker. Osteoporosis. You may be screened starting at age 10 if you are at high risk. Talk with your health care provider about your test results, treatment options, and if necessary, the need for more tests. Vaccines  Your health  care provider may recommend certain vaccines, such as: Influenza vaccine. This is recommended every year. Tetanus, diphtheria, and acellular pertussis  (Tdap, Td) vaccine. You may need a Td booster every 10 years. Zoster vaccine. You may need this after age 59. Pneumococcal 13-valent conjugate (PCV13) vaccine. One dose is recommended after age 11. Pneumococcal polysaccharide (PPSV23) vaccine. One dose is recommended after age 27. Talk to your health care provider about which screenings and vaccines you need and how often you need them. This information is not intended to replace advice given to you by your health care provider. Make sure you discuss any questions you have with your health care provider. Document Released: 08/10/2015 Document Revised: 04/02/2016 Document Reviewed: 05/15/2015 Elsevier Interactive Patient Education  2017 [place] Prevention in the Home Falls can cause injuries. They can happen to people of all ages. There are many things you can do to make your home safe and to help prevent falls. What can I do on the outside of my home? Regularly fix the edges of walkways and driveways and fix any cracks. Remove anything that might make you trip as you walk through a door, such as a raised step or threshold. Trim any bushes or trees on the path to your home. Use bright outdoor lighting. Clear any walking paths of anything that might make someone trip, such as rocks or tools. Regularly check to see if handrails are loose or broken. Make sure that both sides of any steps have handrails. Any raised decks and porches should have guardrails on the edges. Have any leaves, snow, or ice cleared regularly. Use sand or salt on walking paths during winter. Clean up any spills in your garage right away. This includes oil or grease spills. What can I do in the bathroom? Use night lights. Install grab bars by the toilet and in the tub and shower. Do not use towel bars as grab bars. Use non-skid mats or decals in the tub or shower. If you need to sit down in the shower, use a plastic, non-slip stool. Keep the floor dry. Clean  up any water that spills on the floor as soon as it happens. Remove soap buildup in the tub or shower regularly. Attach bath mats securely with double-sided non-slip rug tape. Do not have throw rugs and other things on the floor that can make you trip. What can I do in the bedroom? Use night lights. Make sure that you have a light by your bed that is easy to reach. Do not use any sheets or blankets that are too big for your bed. They should not hang down onto the floor. Have a firm chair that has side arms. You can use this for support while you get dressed. Do not have throw rugs and other things on the floor that can make you trip. What can I do in the kitchen? Clean up any spills right away. Avoid walking on wet floors. Keep items that you use a lot in easy-to-reach places. If you need to reach something above you, use a strong step stool that has a grab bar. Keep electrical cords out of the way. Do not use floor polish or wax that makes floors slippery. If you must use wax, use non-skid floor wax. Do not have throw rugs and other things on the floor that can make you trip. What can I do with my stairs? Do not leave any items on the stairs. Make sure that there are handrails on  both sides of the stairs and use them. Fix handrails that are broken or loose. Make sure that handrails are as long as the stairways. Check any carpeting to make sure that it is firmly attached to the stairs. Fix any carpet that is loose or worn. Avoid having throw rugs at the top or bottom of the stairs. If you do have throw rugs, attach them to the floor with carpet tape. Make sure that you have a light switch at the top of the stairs and the bottom of the stairs. If you do not have them, ask someone to add them for you. What else can I do to help prevent falls? Wear shoes that: Do not have high heels. Have rubber bottoms. Are comfortable and fit you well. Are closed at the toe. Do not wear sandals. If you  use a stepladder: Make sure that it is fully opened. Do not climb a closed stepladder. Make sure that both sides of the stepladder are locked into place. Ask someone to hold it for you, if possible. Clearly mark and make sure that you can see: Any grab bars or handrails. First and last steps. Where the edge of each step is. Use tools that help you move around (mobility aids) if they are needed. These include: Canes. Walkers. Scooters. Crutches. Turn on the lights when you go into a dark area. Replace any light bulbs as soon as they burn out. Set up your furniture so you have a clear path. Avoid moving your furniture around. If any of your floors are uneven, fix them. If there are any pets around you, be aware of where they are. Review your medicines with your doctor. Some medicines can make you feel dizzy. This can increase your chance of falling. Ask your doctor what other things that you can do to help prevent falls. This information is not intended to replace advice given to you by your health care provider. Make sure you discuss any questions you have with your health care provider. Document Released: 05/10/2009 Document Revised: 12/20/2015 Document Reviewed: 08/18/2014 Elsevier Interactive Patient Education  2017 [employer].

## 2021-05-20 NOTE — Progress Notes (Signed)
Subjective:   Barry Dubie Sr. is a 70 y.o. male who presents for Medicare Annual/Subsequent preventive examination.  Virtual Visit via Telephone Note  I connected with  Barry Markwood Sr. on 05/20/21 at  4:15 PM EDT by telephone and verified that I am speaking with the correct person using two identifiers.  Location: Patient: Home Provider: [facility] Persons participating in the virtual visit: patient/Nurse Health Advisor   I discussed the limitations, risks, security and privacy concerns of performing an evaluation and management service by telephone and the availability of in person appointments. The patient expressed understanding and agreed to proceed.  Interactive audio and video telecommunications were attempted between this nurse and patient, however failed, due to patient having technical difficulties OR patient did not have access to video capability.  We continued and completed visit with audio only.  Some vital signs may be absent or patient reported.   Kerrick Miler E Antonia Jicha, LPN   Review of Systems     Cardiac Risk Factors include: advanced age (>71mn, >>59women);male gender;diabetes mellitus;dyslipidemia;hypertension     Objective:    Today's Vitals   05/20/21 1609  Weight: 156 lb (70.8 kg)  Height: 5' 6"  (1.676 m)   Body mass index is 25.18 kg/m.  Advanced Directives 05/20/2021 10/18/2019 03/02/2018 09/24/2017  Does Patient Have a Medical Advance Directive? No No No No  Would patient like information on creating a medical advance directive? No - Patient declined Yes (MAU/Ambulatory/Procedural Areas - Information given) No - Patient declined -    Current Medications (verified) Outpatient Encounter Medications as of 05/20/2021  Medication Sig   aspirin 81 MG tablet Take 81 mg by mouth daily.   atorvastatin (LIPITOR) 40 MG tablet TAKE ONE (1) TABLET EACH DAY   Blood Glucose Calibration (TRUE METRIX LEVEL 1) Low SOLN Use with glucose monitor Dx E11.9   blood  glucose meter kit and supplies Dispense based on patient and insurance preference. Use up to four times daily as directed. (FOR ICD-10 E10.9, E11.9).   Blood Glucose Monitoring Suppl (TRUE METRIX METER) w/Device KIT Test BS four times daily Dx E11.9   cholecalciferol (VITAMIN D) 1000 UNITS tablet Take 1 tablet (1,000 Units total) by mouth daily.   gabapentin (NEURONTIN) 300 MG capsule TAKE 1 CAPSULE THREE TIMES DAILY   glimepiride (AMARYL) 2 MG tablet Take 1 tablet (2 mg total) by mouth every morning.   glucose blood (TRUE METRIX BLOOD GLUCOSE TEST) test strip Test BS four times daily Dx E11.9   losartan (COZAAR) 100 MG tablet Take 1 tablet (100 mg total) by mouth daily.   metFORMIN (GLUCOPHAGE) 500 MG tablet Take 2 tablets (1,000 mg total) by mouth every morning AND 3 tablets (1,500 mg total) at bedtime.   Multiple Vitamin (MULTIVITAMIN) capsule Take 1 capsule by mouth daily.   omega-3 acid ethyl esters (LOVAZA) 1 g capsule Take 2 capsules (2 g total) by mouth 2 (two) times daily.   Omega-3 Fatty Acids (FISH OIL) 1200 MG CAPS Take 2 capsules by mouth daily.    sitaGLIPtin (JANUVIA) 50 MG tablet TAKE ONE (1) TABLET EACH DAY   tamsulosin (FLOMAX) 0.4 MG CAPS capsule Take 1 capsule (0.4 mg total) by mouth daily.   TRUEplus Lancets 33G MISC Test BS four times daily Dx E11.9   Vitamin D, Ergocalciferol, (DRISDOL) 1.25 MG (50000 UNIT) CAPS capsule TAKE 1 CAPSULE EVERY WEEK   amoxicillin-clavulanate (AUGMENTIN) 875-125 MG tablet Take 1 tablet by mouth 2 (two) times daily. (Patient not taking:  Reported on 05/20/2021)   benzonatate (TESSALON) 200 MG capsule Take 1 capsule (200 mg total) by mouth 2 (two) times daily as needed for cough. (Patient not taking: Reported on 05/20/2021)   No facility-administered encounter medications on file as of 05/20/2021.    Allergies (verified) Patient has no known allergies.   History: Past Medical History:  Diagnosis Date   Anxiety    Essential hypertension     Hyperlipidemia    Type 2 diabetes mellitus (HCC)    Past Surgical History:  Procedure Laterality Date   APPENDECTOMY     Family History  Problem Relation Age of Onset   Diabetes Father    Diabetes Sister    Pneumonia Brother    Diabetes Sister    Diabetes Sister    Diabetes Sister    Diabetes Sister    Diabetes Sister    Cancer Sister    Diabetes Brother    Diabetes Brother    Diabetes Brother    Diabetes Brother    Diabetes Brother    Social History   Socioeconomic History   Marital status: Married    Spouse name: Tye [place]   Number of children: Not on file   Years of education: Not on file   Highest education level: Not on file  Occupational History   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    Comment: full time  Tobacco Use   Smoking status: Never   Smokeless tobacco: Never  Vaping Use   Vaping Use: Never used  Substance and Sexual Activity   Alcohol use: No   Drug use: No   Sexual activity: Yes  Other Topics Concern   Not on file  Social History Narrative   Lives on one level with his wife - daughter lives in [place], son next door   Social Determinants of Health   Financial Resource Strain: Low Risk    Difficulty of Paying Living Expenses: Not very hard  Food Insecurity: No Food Insecurity   Worried About Charity fundraiser in the Last Year: Never true   [place] in the Last Year: Never true  Transportation Needs: No Transportation Needs   Lack of Transportation (Medical): No   Lack of Transportation (Non-Medical): No  Physical Activity: Sufficiently Active   Days of Exercise per Week: 5 days   Minutes of Exercise per Session: 60 min  Stress: No Stress Concern Present   Feeling of Stress : Not at all  Social Connections: Moderately Isolated   Frequency of Communication with Friends and Family: More than three times a week   Frequency of Social Gatherings with Friends and Family: Three times a week   Attends Religious Services: Never    Active Member of Clubs or Organizations: No   Attends Archivist Meetings: Never   Marital Status: Married    Tobacco Counseling Counseling given: Not Answered   Clinical Intake:  Pre-visit preparation completed: Yes  Pain : No/denies pain     BMI - recorded: 25.18 Nutritional Status: BMI 25 -29 Overweight Nutritional Risks: None Diabetes: Yes CBG done?: No Did pt. bring in CBG monitor from home?: No  How often do you need to have someone help you when you read instructions, pamphlets, or other written materials from your doctor or pharmacy?: 1 - Never  Diabetic? Yes Nutrition Risk Assessment:  Has the patient had any N/V/D within the last 2 months?  Yes  recently recovering from Covid Does the patient have any non-healing wounds?  No  Has the patient had any unintentional weight loss or weight gain?  Yes  from covid - improving now  Diabetes:  Is the patient diabetic?  Yes  If diabetic, was a CBG obtained today?  No  Did the patient bring in their glucometer from home?  No  How often do you monitor your CBG's? When he feels bad - doesn't check regularly.   Financial Strains and Diabetes Management:  Are you having any financial strains with the device, your supplies or your medication? No .  Does the patient want to be seen by Chronic Care Management for management of their diabetes?  No  Would the patient like to be referred to a Nutritionist or for Diabetic Management?  No   Diabetic Exams:  Diabetic Eye Exam: Completed 03/16/2018. Overdue for diabetic eye exam. Pt has been advised about the importance in completing this exam. Patient declines referral - doesn't do annual visits - he goes prn  Diabetic Foot Exam: Completed 07/30/2020. Pt has been advised about the importance in completing this exam. Pt is scheduled for diabetic foot exam on 06/03/2021.    Interpreter Needed?: No  Information entered by :: Mang Hazelrigg, LPN   Activities of Daily  Living In your present state of health, do you have any difficulty performing the following activities: 05/20/2021  Hearing? N  Vision? N  Difficulty concentrating or making decisions? N  Walking or climbing stairs? N  Dressing or bathing? N  Doing errands, shopping? N  Preparing Food and eating ? N  Using the Toilet? N  In the past six months, have you accidently leaked urine? N  Do you have problems with loss of bowel control? N  Managing your Medications? N  Managing your Finances? N  Housekeeping or managing your Housekeeping? N  Some recent data might be hidden    Patient Care Team: Sharion Balloon, FNP as PCP - General (Family Medicine) Satira Sark, MD as PCP - Cardiology (Cardiology)  Indicate any recent Medical Services you may have received from other than [facility] providers in the past year (date may be approximate).     Assessment:   This is a routine wellness examination for [place].  Hearing/Vision screen Hearing Screening - Comments:: Denies hearing difficulties  Vision Screening - Comments:: Wears rx glasses - behind on annual eye exams with [facility]  Dietary issues and exercise activities discussed: Current Exercise Habits: The patient has a physically strenuous job, but has no regular exercise apart from work., Exercise limited by: neurologic condition(s)   Goals Addressed             This Visit's Progress    Exercise 150 min/wk Moderate Activity   On track      Depression Screen PHQ 2/9 Scores 05/20/2021 11/28/2020 07/30/2020 03/30/2020 10/18/2019 06/15/2019 03/29/2019  PHQ - 2 Score 0 0 0 0 0 0 0    Fall Risk Fall Risk  05/20/2021 11/28/2020 07/30/2020 03/30/2020 10/18/2019  Falls in the past year? 0 0 0 0 0  Number falls in past yr: 0 - - - -  Injury with Fall? 0 - - - -  Risk for fall due to : Other (Comment) - - - -  Risk for fall due to: Comment neuropathy - - - -  Follow up Falls prevention discussed - - - -    FALL RISK PREVENTION  PERTAINING TO THE HOME:  Any stairs in or around the home? No  If so, are  there any without handrails? No  Home free of loose throw rugs in walkways, pet beds, electrical cords, etc? Yes  Adequate lighting in your home to reduce risk of falls? Yes   ASSISTIVE DEVICES UTILIZED TO PREVENT FALLS:  Life alert? No  Use of a cane, walker or w/c? Yes  Grab bars in the bathroom? Yes  Shower chair or bench in shower? No  Elevated toilet seat or a handicapped toilet? No   TIMED UP AND GO:  Was the test performed? No . Telephonic visit  Cognitive Function: Normal cognitive status assessed by direct observation by this Nurse Health Advisor. No abnormalities found.   MMSE - Mini Mental State Exam 03/02/2018  Orientation to time 5  Orientation to Place 5  Registration 3  Attention/ Calculation 5  Recall 3  Language- name 2 objects 2  Language- repeat 1  Language- follow 3 step command 3  Language- read & follow direction 1  Write a sentence 1  Copy design 1  Total score 30     6CIT Screen 10/18/2019  What time? 0 points  Count back from 20 2 points  Months in reverse 0 points  Repeat phrase 0 points    Immunizations Immunization History  Administered Date(s) Administered   Fluad Quad(high Dose 65+) 03/29/2019, 06/06/2020   Influenza, High Dose Seasonal PF 05/20/2016, 05/26/2017, 05/19/2018   Influenza,inj,Quad PF,6+ Mos 05/27/2013, 08/10/2014, 05/17/2015   Moderna SARS-COV2 Booster Vaccination 08/15/2020   Moderna Sars-Covid-2 Vaccination 09/27/2019, 10/25/2019   Pneumococcal Conjugate-13 08/10/2014   Pneumococcal Polysaccharide-23 01/22/2017   Td 04/29/2017    TDAP status: Up to date  Flu Vaccine status: Due, Education has been provided regarding the importance of this vaccine. Advised may receive this vaccine at local pharmacy or Health Dept. Aware to provide a copy of the vaccination record if obtained from local pharmacy or Health Dept. Verbalized acceptance and  understanding.  Pneumococcal vaccine status: Up to date  Covid-19 vaccine status: Completed vaccines  Qualifies for Shingles Vaccine? Yes   Zostavax completed No   Shingrix Completed?: No.    Education has been provided regarding the importance of this vaccine. Patient has been advised to call insurance company to determine out of pocket expense if they have not yet received this vaccine. Advised may also receive vaccine at local pharmacy or Health Dept. Verbalized acceptance and understanding.  Screening Tests Health Maintenance  Topic Date Due   Zoster Vaccines- Shingrix (1 of 2) Never done   OPHTHALMOLOGY EXAM  03/17/2019   COVID-19 Vaccine (3 - Booster for Moderna series) 10/10/2020   COLONOSCOPY (Pts 45-71yr Insurance coverage will need to be confirmed)  01/31/2021   INFLUENZA VACCINE  02/25/2021   HEMOGLOBIN A1C  05/31/2021   FOOT EXAM  07/30/2021   TETANUS/TDAP  04/30/2027   Pneumonia Vaccine 70 Years old  Completed   Hepatitis C Screening  Completed   HPV VACCINES  Aged Out    Health Maintenance  Health Maintenance Due  Topic Date Due   Zoster Vaccines- Shingrix (1 of 2) Never done   OPHTHALMOLOGY EXAM  03/17/2019   COVID-19 Vaccine (3 - Booster for Moderna series) 10/10/2020   COLONOSCOPY (Pts 45-414yrInsurance coverage will need to be confirmed)  01/31/2021   INFLUENZA VACCINE  02/25/2021    Colorectal cancer screening: Type of screening: Colonoscopy. Completed 2012. Repeat every 10 years DECLINED  Lung Cancer Screening: (Low Dose CT Chest recommended if Age 70-80ears, 30 pack-year currently smoking OR have quit w/in 15years.)  does not qualify.  Additional Screening:  Hepatitis C Screening: does qualify; Completed 01/22/2017  Vision Screening: Recommended annual ophthalmology exams for early detection of glaucoma and other disorders of the eye. Is the patient up to date with their annual eye exam?  No  Who is the provider or what is the name of the office  in which the patient attends annual eye exams? [facility] - he doesn't go to eye doctor unless he has a problem - declines annual visits If pt is not established with a provider, would they like to be referred to a provider to establish care? No .   Dental Screening: Recommended annual dental exams for proper oral hygiene  Community Resource Referral / Chronic Care Management: CRR required this visit?  No   CCM required this visit?  No      Plan:     I have personally reviewed and noted the following in the patient's chart:   Medical and social history Use of alcohol, tobacco or illicit drugs  Current medications and supplements including opioid prescriptions. Patient is not currently taking opioid prescriptions. Functional ability and status Nutritional status Physical activity Advanced directives List of other physicians Hospitalizations, surgeries, and ER visits in previous 12 months Vitals Screenings to include cognitive, depression, and falls Referrals and appointments  In addition, I have reviewed and discussed with patient certain preventive protocols, quality metrics, and best practice recommendations. A written personalized care plan for preventive services as well as general preventive health recommendations were provided to patient.     Sandrea Hammond, LPN   54/27/0623   Nurse Notes: None

## 2021-06-03 ENCOUNTER — Ambulatory Visit (INDEPENDENT_AMBULATORY_CARE_PROVIDER_SITE_OTHER): Payer: Medicare HMO | Admitting: Family

## 2021-06-03 ENCOUNTER — Encounter: Payer: Self-pay | Admitting: Family

## 2021-06-03 ENCOUNTER — Other Ambulatory Visit: Payer: Self-pay

## 2021-06-03 VITALS — BP 114/69 | HR 67 | Temp 97.0°F | Ht <= 58 in | Wt 173.8 lb

## 2021-06-03 DIAGNOSIS — E785 Hyperlipidemia, unspecified: Secondary | ICD-10-CM

## 2021-06-03 DIAGNOSIS — E663 Overweight: Secondary | ICD-10-CM

## 2021-06-03 DIAGNOSIS — E1169 Type 2 diabetes mellitus with other specified complication: Secondary | ICD-10-CM | POA: Diagnosis not present

## 2021-06-03 DIAGNOSIS — G4700 Insomnia, unspecified: Secondary | ICD-10-CM | POA: Diagnosis not present

## 2021-06-03 DIAGNOSIS — E1165 Type 2 diabetes mellitus with hyperglycemia: Secondary | ICD-10-CM

## 2021-06-03 DIAGNOSIS — Z23 Encounter for immunization: Secondary | ICD-10-CM

## 2021-06-03 DIAGNOSIS — E559 Vitamin D deficiency, unspecified: Secondary | ICD-10-CM | POA: Diagnosis not present

## 2021-06-03 DIAGNOSIS — E1159 Type 2 diabetes mellitus with other circulatory complications: Secondary | ICD-10-CM | POA: Diagnosis not present

## 2021-06-03 DIAGNOSIS — I152 Hypertension secondary to endocrine disorders: Secondary | ICD-10-CM

## 2021-06-03 DIAGNOSIS — E1142 Type 2 diabetes mellitus with diabetic polyneuropathy: Secondary | ICD-10-CM

## 2021-06-03 DIAGNOSIS — N401 Enlarged prostate with lower urinary tract symptoms: Secondary | ICD-10-CM | POA: Diagnosis not present

## 2021-06-03 LAB — CMP14+EGFR
ALT: 14 IU/L (ref 0–44)
AST: 15 IU/L (ref 0–40)
Albumin/Globulin Ratio: 2.4 — ABNORMAL HIGH (ref 1.2–2.2)
Albumin: 4.7 g/dL (ref 3.8–4.8)
Alkaline Phosphatase: 60 IU/L (ref 44–121)
BUN/Creatinine Ratio: 12 (ref 10–24)
BUN: 12 mg/dL (ref 8–27)
Bilirubin Total: 0.4 mg/dL (ref 0.0–1.2)
CO2: 24 mmol/L (ref 20–29)
Calcium: 10.2 mg/dL (ref 8.6–10.2)
Chloride: 103 mmol/L (ref 96–106)
Creatinine, Ser: 1.01 mg/dL (ref 0.76–1.27)
Globulin, Total: 2 g/dL (ref 1.5–4.5)
Glucose: 92 mg/dL (ref 70–99)
Potassium: 4.9 mmol/L (ref 3.5–5.2)
Sodium: 142 mmol/L (ref 134–144)
Total Protein: 6.7 g/dL (ref 6.0–8.5)
eGFR: 80 mL/min/{1.73_m2} (ref >59)

## 2021-06-03 LAB — CBC WITH DIFFERENTIAL/PLATELET
Basophils Absolute: 0 10*3/uL (ref 0.0–0.2)
Basos: 0 %
EOS (ABSOLUTE): 0.1 10*3/uL (ref 0.0–0.4)
Eos: 2 %
Hematocrit: 38.5 % (ref 37.5–51.0)
Hemoglobin: 12.7 g/dL — ABNORMAL LOW (ref 13.0–17.7)
Immature Grans (Abs): 0 10*3/uL (ref 0.0–0.1)
Immature Granulocytes: 0 %
Lymphocytes Absolute: 1.5 10*3/uL (ref 0.7–3.1)
Lymphs: 34 %
MCH: 28.3 pg (ref 26.6–33.0)
MCHC: 33 g/dL (ref 31.5–35.7)
MCV: 86 fL (ref 79–97)
Monocytes Absolute: 0.3 10*3/uL (ref 0.1–0.9)
Monocytes: 7 %
Neutrophils Absolute: 2.6 10*3/uL (ref 1.4–7.0)
Neutrophils: 57 %
Platelets: 336 10*3/uL (ref 150–450)
RBC: 4.49 x10E6/uL (ref 4.14–5.80)
RDW: 13.2 % (ref 11.6–15.4)
WBC: 4.5 10*3/uL (ref 3.4–10.8)

## 2021-06-03 LAB — BAYER DCA HB A1C WAIVED: HB A1C (BAYER DCA - WAIVED): 5.7 % — ABNORMAL HIGH (ref 4.8–5.6)

## 2021-06-03 MED ORDER — METFORMIN HCL 500 MG PO TABS: ORAL_TABLET | ORAL | 4 refills | Status: DC

## 2021-06-03 MED ORDER — TAMSULOSIN HCL 0.4 MG PO CAPS
0.4000 mg | ORAL_CAPSULE | Freq: Every day | ORAL | 4 refills | Status: DC
Start: 2021-06-03 — End: 2021-11-04

## 2021-06-03 MED ORDER — ATORVASTATIN CALCIUM 40 MG PO TABS: ORAL_TABLET | ORAL | 2 refills | Status: DC

## 2021-06-03 MED ORDER — SITAGLIPTIN PHOSPHATE 50 MG PO TABS: ORAL_TABLET | ORAL | 3 refills | Status: DC

## 2021-06-03 MED ORDER — LOSARTAN POTASSIUM 100 MG PO TABS: 100.0000 mg | ORAL_TABLET | Freq: Every day | ORAL | 4 refills | Status: DC

## 2021-06-03 MED ORDER — GLIMEPIRIDE 2 MG PO TABS
2.0000 mg | ORAL_TABLET | Freq: Every morning | ORAL | 3 refills | Status: DC
Start: 2021-06-03 — End: 2021-07-12

## 2021-06-03 MED ORDER — GABAPENTIN 300 MG PO CAPS: ORAL_CAPSULE | ORAL | 2 refills | Status: DC

## 2021-06-03 NOTE — Progress Notes (Signed)
Subjective:    Patient ID: Barry Smith., male    DOB: [birth year], 70 y.o.   MRN: [id number]  Chief Complaint  Patient presents with   Medical Management of Chronic Issues   PT presents to the office today for chronic follow up. Hypertension This is a chronic problem. The current episode started more than 1 year ago. The problem has been resolved since onset. The problem is controlled. Pertinent negatives include no blurred vision, malaise/fatigue or peripheral edema. Risk factors for coronary artery disease include dyslipidemia, diabetes mellitus, obesity, male gender and sedentary lifestyle. The current treatment provides moderate improvement. There is no history of CVA or heart failure.  Hyperlipidemia This is a chronic problem. The current episode started more than 1 year ago. The problem is controlled. He has no history of obesity. Current antihyperlipidemic treatment includes statins. The current treatment provides moderate improvement of lipids. Risk factors for coronary artery disease include dyslipidemia, diabetes mellitus, male sex, hypertension and a sedentary lifestyle.  Diabetes He presents for his follow-up diabetic visit. He has type 2 diabetes mellitus. There are no hypoglycemic associated symptoms. Pertinent negatives for diabetes include no blurred vision and no foot paresthesias. Symptoms are stable. Pertinent negatives for diabetic complications include no CVA. Risk factors for coronary artery disease include dyslipidemia, diabetes mellitus, male sex, hypertension and sedentary lifestyle. He is following a generally healthy diet. (Does not check BS at home) An ACE inhibitor/angiotensin II receptor blocker is being taken.  Benign Prostatic Hypertrophy This is a chronic problem. The current episode started more than 1 year ago. Irritative symptoms do not include nocturia.  Insomnia Primary symptoms: difficulty falling asleep, no somnolence, frequent awakening, no  malaise/fatigue.   The current episode started more than one year. The onset quality is gradual.     Review of Systems  Constitutional:  Negative for malaise/fatigue.  Eyes:  Negative for blurred vision.  Genitourinary:  Negative for nocturia.  Psychiatric/Behavioral:  The patient has insomnia.   All other systems reviewed and are negative.     Objective:   Physical Exam Vitals reviewed.  Constitutional:      General: He is not in acute distress.    Appearance: He is well-developed. He is obese.  HENT:     Head: Normocephalic.     Right Ear: Tympanic membrane normal.     Left Ear: Tympanic membrane normal.  Eyes:     General:        Right eye: No discharge.        Left eye: No discharge.     Pupils: Pupils are equal, round, and reactive to light.  Neck:     Thyroid: No thyromegaly.  Cardiovascular:     Rate and Rhythm: Normal rate and regular rhythm.     Heart sounds: Normal heart sounds. No murmur heard. Pulmonary:     Effort: Pulmonary effort is normal. No respiratory distress.     Breath sounds: Normal breath sounds. No wheezing.  Abdominal:     General: Bowel sounds are normal. There is no distension.     Palpations: Abdomen is soft.     Tenderness: There is no abdominal tenderness.  Musculoskeletal:        General: No tenderness. Normal range of motion.     Cervical back: Normal range of motion and neck supple.  Skin:    General: Skin is warm and dry.     Findings: No erythema or rash.  Neurological:     Mental  Status: He is alert and oriented to person, place, and time.     Cranial Nerves: No cranial nerve deficit.     Deep Tendon Reflexes: Reflexes are normal and symmetric.  Psychiatric:        Behavior: Behavior normal.        Thought Content: Thought content normal.        Judgment: Judgment normal.         BP 114/69   Pulse 67   Temp (!) 97 F (36.1 C) (Temporal)   Ht 2' (0.61 m)   Wt 173 lb 12.8 oz (78.8 kg)   BMI 212.14 kg/m   Assessment  & Plan:  Livan Hires Sr. comes in today with chief complaint of Medical Management of Chronic Issues   Diagnosis and orders addressed:  1. Diabetic polyneuropathy associated with type 2 diabetes mellitus (HCC) - gabapentin (NEURONTIN) 300 MG capsule; TAKE 1 CAPSULE THREE TIMES DAILY  Dispense: 270 capsule; Refill: 2 - sitaGLIPtin (JANUVIA) 50 MG tablet; TAKE ONE (1) TABLET EACH DAY  Dispense: 90 tablet; Refill: 3 - metFORMIN (GLUCOPHAGE) 500 MG tablet; Take 2 tablets (1,000 mg total) by mouth every morning AND 3 tablets (1,500 mg total) at bedtime.  Dispense: 450 tablet; Refill: 4 - glimepiride (AMARYL) 2 MG tablet; Take 1 tablet (2 mg total) by mouth every morning.  Dispense: 90 tablet; Refill: 3 - CMP14+EGFR - CBC with Differential/Platelet  2. Benign prostatic hyperplasia with lower urinary tract symptoms, symptom details unspecified - tamsulosin (FLOMAX) 0.4 MG CAPS capsule; Take 1 capsule (0.4 mg total) by mouth daily.  Dispense: 90 capsule; Refill: 4 - CMP14+EGFR - CBC with Differential/Platelet  3. Type 2 diabetes mellitus with hyperglycemia, without long-term current use of insulin (HCC) - sitaGLIPtin (JANUVIA) 50 MG tablet; TAKE ONE (1) TABLET EACH DAY  Dispense: 90 tablet; Refill: 3 - metFORMIN (GLUCOPHAGE) 500 MG tablet; Take 2 tablets (1,000 mg total) by mouth every morning AND 3 tablets (1,500 mg total) at bedtime.  Dispense: 450 tablet; Refill: 4 - glimepiride (AMARYL) 2 MG tablet; Take 1 tablet (2 mg total) by mouth every morning.  Dispense: 90 tablet; Refill: 3 - Bayer DCA Hb A1c Waived - CMP14+EGFR - CBC with Differential/Platelet  4. Hyperlipidemia associated with type 2 diabetes mellitus (HCC) - atorvastatin (LIPITOR) 40 MG tablet; TAKE ONE (1) TABLET EACH DAY  Dispense: 90 tablet; Refill: 2 - CMP14+EGFR - CBC with Differential/Platelet  5. Hypertension associated with diabetes ([place]) - losartan (COZAAR) 100 MG tablet; Take 1 tablet (100 mg total) by mouth  daily.  Dispense: 90 tablet; Refill: 4 - CMP14+EGFR - CBC with Differential/Platelet  6. Insomnia, unspecified type - CMP14+EGFR - CBC with Differential/Platelet  7. Overweight (BMI 25.0-29.9) - CMP14+EGFR - CBC with Differential/Platelet  8. Vitamin D deficiency - CMP14+EGFR - CBC with Differential/Platelet  9. Need for immunization against influenza - Flu Vaccine QUAD High Dose(Fluad) - CMP14+EGFR - CBC with Differential/Platelet   Labs pending Health Maintenance reviewed Diet and exercise encouraged  Follow up plan: 6 months    Evelina Dun, FNP

## 2021-06-03 NOTE — Patient Instructions (Signed)
Health Maintenance After Age 70 After age 70, you are at a higher risk for certain long-term diseases and infections as well as injuries from falls. Falls are a major cause of broken bones and head injuries in people who are older than age 70. Getting regular preventive care can help to keep you healthy and well. Preventive care includes getting regular testing and making lifestyle changes as recommended by your health care provider. Talk with your health care provider about: Which screenings and tests you should have. A screening is a test that checks for a disease when you have no symptoms. A diet and exercise plan that is right for you. What should I know about screenings and tests to prevent falls? Screening and testing are the best ways to find a health problem early. Early diagnosis and treatment give you the best chance of managing medical conditions that are common after age 70. Certain conditions and lifestyle choices may make you more likely to have a fall. Your health care provider may recommend: Regular vision checks. Poor vision and conditions such as cataracts can make you more likely to have a fall. If you wear glasses, make sure to get your prescription updated if your vision changes. Medicine review. Work with your health care provider to regularly review all of the medicines you are taking, including over-the-counter medicines. Ask your health care provider about any side effects that may make you more likely to have a fall. Tell your health care provider if any medicines that you take make you feel dizzy or sleepy. Strength and balance checks. Your health care provider may recommend certain tests to check your strength and balance while standing, walking, or changing positions. Foot health exam. Foot pain and numbness, as well as not wearing proper footwear, can make you more likely to have a fall. Screenings, including: Osteoporosis screening. Osteoporosis is a condition that causes  the bones to get weaker and break more easily. Blood pressure screening. Blood pressure changes and medicines to control blood pressure can make you feel dizzy. Depression screening. You may be more likely to have a fall if you have a fear of falling, feel depressed, or feel unable to do activities that you used to do. Alcohol use screening. Using too much alcohol can affect your balance and may make you more likely to have a fall. Follow these instructions at home: Lifestyle Do not drink alcohol if: Your health care provider tells you not to drink. If you drink alcohol: Limit how much you have to: 0-1 drink a day for women. 0-2 drinks a day for men. Know how much alcohol is in your drink. In the [place] one drink equals one 12 oz bottle of beer (355 mL), one 5 oz glass of wine (148 mL), or one 1 oz glass of hard liquor (44 mL). Do not use any products that contain nicotine or tobacco. These products include cigarettes, chewing tobacco, and vaping devices, such as e-cigarettes. If you need help quitting, ask your health care provider. Activity  Follow a regular exercise program to stay fit. This will help you maintain your balance. Ask your health care provider what types of exercise are appropriate for you. If you need a cane or walker, use it as recommended by your health care provider. Wear supportive shoes that have nonskid soles. Safety  Remove any tripping hazards, such as rugs, cords, and clutter. Install safety equipment such as grab bars in bathrooms and safety rails on stairs. Keep rooms and walkways   well-lit. General instructions Talk with your health care provider about your risks for falling. Tell your health care provider if: You fall. Be sure to tell your health care provider about all falls, even ones that seem minor. You feel dizzy, tiredness (fatigue), or off-balance. Take over-the-counter and prescription medicines only as told by your health care provider. These include  supplements. Eat a healthy diet and maintain a healthy weight. A healthy diet includes low-fat dairy products, low-fat (lean) meats, and fiber from whole grains, beans, and lots of fruits and vegetables. Stay current with your vaccines. Schedule regular health, dental, and eye exams. Summary Having a healthy lifestyle and getting preventive care can help to protect your health and wellness after age 70. Screening and testing are the best way to find a health problem early and help you avoid having a fall. Early diagnosis and treatment give you the best chance for managing medical conditions that are more common for people who are older than age 70. Falls are a major cause of broken bones and head injuries in people who are older than age 70. Take precautions to prevent a fall at home. Work with your health care provider to learn what changes you can make to improve your health and wellness and to prevent falls. This information is not intended to replace advice given to you by your health care provider. Make sure you discuss any questions you have with your health care provider. Document Revised: 12/03/2020 Document Reviewed: 12/03/2020 Elsevier Patient Education  2022 [employer] Inc.  

## 2021-07-10 ENCOUNTER — Ambulatory Visit (INDEPENDENT_AMBULATORY_CARE_PROVIDER_SITE_OTHER): Payer: Medicare HMO | Admitting: Family

## 2021-07-10 ENCOUNTER — Encounter: Payer: Self-pay | Admitting: Family

## 2021-07-10 VITALS — BP 114/57 | HR 64 | Temp 97.3°F | Ht 69.5 in | Wt 182.6 lb

## 2021-07-10 DIAGNOSIS — E1142 Type 2 diabetes mellitus with diabetic polyneuropathy: Secondary | ICD-10-CM | POA: Diagnosis not present

## 2021-07-10 DIAGNOSIS — R040 Epistaxis: Secondary | ICD-10-CM | POA: Diagnosis not present

## 2021-07-10 DIAGNOSIS — M19019 Primary osteoarthritis, unspecified shoulder: Secondary | ICD-10-CM

## 2021-07-10 MED ORDER — GABAPENTIN 300 MG PO CAPS: 300.0000 mg | ORAL_CAPSULE | Freq: Four times a day (QID) | ORAL | 2 refills | Status: DC

## 2021-07-10 MED ORDER — PREDNISONE 10 MG (21) PO TBPK: ORAL_TABLET | ORAL | 0 refills | Status: DC

## 2021-07-10 MED ORDER — DICLOFENAC SODIUM 75 MG PO TBEC: 75.0000 mg | DELAYED_RELEASE_TABLET | Freq: Two times a day (BID) | ORAL | 0 refills | Status: DC

## 2021-07-10 NOTE — Progress Notes (Signed)
Subjective:    Patient ID: Barry Smith., male    DOB: 08/22/1950, 70 y.o.   MRN: [id number]  Chief Complaint  Patient presents with   Shoulder Pain    Pain in both shoulder.    Epistaxis   Pt presents to the office today for bilateral shoulder pain that started several weeks ago. He has used Voltaren gel with mild relief.  Shoulder Pain  The pain is present in the right shoulder and left shoulder. This is a new problem. The current episode started 1 to 4 weeks ago. There has been no history of extremity trauma. The problem occurs intermittently. The quality of the pain is described as aching. The pain is at a severity of 9/10. The pain is moderate. Associated symptoms include numbness and tingling. Pertinent negatives include no joint locking, joint swelling or stiffness. He has tried rest and OTC pain meds for the symptoms. The treatment provided mild relief.  Epistaxis  The bleeding has been from the left nare. This is a new problem. The current episode started in the past 7 days. The problem occurs hourly. The bleeding is associated with dry air. He has tried pressure for the symptoms. The treatment provided mild relief.  Diabetes He presents for his follow-up diabetic visit. He has type 2 diabetes mellitus. Associated symptoms include foot paresthesias. Pertinent negatives for diabetes include no blurred vision. Risk factors for coronary artery disease include dyslipidemia, diabetes mellitus, male sex, hypertension and sedentary lifestyle.     Review of Systems  HENT:  Positive for nosebleeds.   Eyes:  Negative for blurred vision.  Musculoskeletal:  Negative for stiffness.  Neurological:  Positive for tingling and numbness.  All other systems reviewed and are negative.     Objective:   Physical Exam Vitals reviewed.  Constitutional:      General: He is not in acute distress.    Appearance: He is well-developed.  HENT:     Head: Normocephalic.     Right Ear: Tympanic  membrane normal.     Left Ear: Tympanic membrane normal.     Nose:     Left Turbinates: Swollen.  Eyes:     General:        Right eye: No discharge.        Left eye: No discharge.     Pupils: Pupils are equal, round, and reactive to light.  Neck:     Thyroid: No thyromegaly.  Cardiovascular:     Rate and Rhythm: Normal rate and regular rhythm.     Heart sounds: Normal heart sounds. No murmur heard. Pulmonary:     Effort: Pulmonary effort is normal. No respiratory distress.     Breath sounds: Normal breath sounds. No wheezing.  Abdominal:     General: Bowel sounds are normal. There is no distension.     Palpations: Abdomen is soft.     Tenderness: There is no abdominal tenderness.  Musculoskeletal:        General: No tenderness. Normal range of motion.     Cervical back: Normal range of motion and neck supple.     Comments: Full ROM of shoulders  Skin:    General: Skin is warm and dry.     Findings: No erythema or rash.  Neurological:     Mental Status: He is alert and oriented to person, place, and time.     Cranial Nerves: No cranial nerve deficit.     Deep Tendon Reflexes: Reflexes are normal  and symmetric.  Psychiatric:        Behavior: Behavior normal.        Thought Content: Thought content normal.        Judgment: Judgment normal.      BP (!) 114/57    Pulse 64    Temp (!) 97.3 F (36.3 C) (Temporal)    Ht 5' 9.5" (1.765 m)    Wt 182 lb 9.6 oz (82.8 kg)    BMI 26.58 kg/m      Assessment & Plan:  Bowman Higbie Sr. comes in today with chief complaint of Shoulder Pain (Pain in both shoulder. ) and Epistaxis   Diagnosis and orders addressed:  1. Diabetic polyneuropathy associated with type 2 diabetes mellitus (HCC) Continue medications and low carb diet Will increase gabapentin to QID from TID - gabapentin (NEURONTIN) 300 MG capsule; Take 1 capsule (300 mg total) by mouth 4 (four) times daily. TAKE 1 CAPSULE THREE TIMES DAILY  Dispense: 120 capsule; Refill:  2  2. Shoulder arthritis Rest Ice  No other NSAID's while taking diclofenac  Prednisone- Strict low carb diet  - diclofenac (VOLTAREN) 75 MG EC tablet; Take 1 tablet (75 mg total) by mouth 2 (two) times daily.  Dispense: 30 tablet; Refill: 0 - predniSONE (STERAPRED UNI-PAK 21 TAB) 10 MG (21) TBPK tablet; Use as directed  Dispense: 21 tablet; Refill: 0  3. Epistaxis Saline gel  Use humidifier       Jannifer Rodney, FNP

## 2021-07-10 NOTE — Patient Instructions (Signed)
Nosebleed, Adult A nosebleed is when blood comes out of the nose. Nosebleeds are common. Usually, they are not a sign of a serious condition. Nosebleeds can happen if a blood vessel in your nose starts to bleed or if the lining of your nose (mucous membrane) cracks. They are commonly caused by: Allergies. Colds. Picking your nose. Blowing your nose too hard. An injury from sticking an object into your nose or getting hit in the nose. Dry or cold air. Less common causes of nosebleeds include: Toxic fumes. Something abnormal in the nose or in the air-filled spaces in the bones of the face (sinuses). Growths in the nose, such as polyps. Blood thinners or conditions that cause blood to clot slowly. Certain illnesses or procedures that irritate or dry out the nasal passages. Follow these instructions at home: When you have a nosebleed:  Sit down and tilt your head slightly forward. Use a clean towel or tissue to pinch your nostrils under the bony part of your nose. After 5 minutes, let go of your nose and see if bleeding starts again. Do not release pressure before that time. If there is still bleeding, repeat the pinching and holding for 5 minutes or until the bleeding stops. Do not place tissues or gauze in the nose to stop the bleeding. Avoid lying down and avoid tilting your head backward. That may make blood collect in the throat and cause gagging or coughing. Use a nasal spray decongestant to help with a nosebleed as told by your health care provider. After a nosebleed: Avoid blowing your nose or sniffing for a number of hours. Avoid straining, lifting, or bending at the waist for several days. You may go back to other normal activities as you are able. If you are taking aspirin or blood thinners and you have nosebleeds, talk to your health care provider. These medicines make bleeding more likely. Ask your health care provider if you should stop taking the medicines or if you should  adjust the dose. Do not stop taking medicines that your health care provider has recommended unless he or she tells you to stop taking them. If your nosebleed was caused by dry mucous membranes, use over-the-counter saline nasal spray or gel and a humidifier as told by your health care provider. This will keep the mucous membranes moist and allow them to heal. If you need to use one of these products: Choose one that is water-soluble. Use only as much as you need and use it only as often as needed. Do not lie down right after you use it. If you get nosebleeds often, talk with your health care provider about medical treatments. Options may include: Nasal cautery. This treatment stops and prevents nosebleeds by using a chemical swab or electrical device to lightly burn tiny blood vessels inside the nose. Nasal packing. A gauze or other material is placed in the nose to keep constant pressure on the bleeding area. Contact a health care provider if you: Have a fever. Get nosebleeds often or more often than usual. Bruise very easily. Have a nosebleed from having something stuck in your nose. Have bleeding in your mouth. Vomit or cough up brown material. Have a nosebleed after you start a new medicine. Get help right away if: You have a nosebleed after a fall or a head injury. Your nosebleed does not go away after 20 minutes. You feel dizzy or weak. You have unusual bleeding from other parts of your body. You have unusual bruising on   other parts of your body. You become sweaty. You vomit blood. Summary A nosebleed is when blood comes out of the nose. Common causes include allergies, an injury to the nose, or cold or dry air. Initial treatment includes applying pressure for 5 minutes. Moisturizing the nose with saline nasal spray or gel after a nosebleed may help prevent future bleeding. Get help right away if your nosebleed does not go away after 20 minutes. This information is not intended  to replace advice given to you by your health care provider. Make sure you discuss any questions you have with your health care provider. Document Revised: 05/12/2019 Document Reviewed: 05/12/2019 Elsevier Patient Education  2022 [employer] Inc.  

## 2021-07-12 ENCOUNTER — Other Ambulatory Visit: Payer: Self-pay | Admitting: Family

## 2021-07-12 DIAGNOSIS — E1165 Type 2 diabetes mellitus with hyperglycemia: Secondary | ICD-10-CM

## 2021-07-12 DIAGNOSIS — E1142 Type 2 diabetes mellitus with diabetic polyneuropathy: Secondary | ICD-10-CM

## 2021-08-19 ENCOUNTER — Other Ambulatory Visit: Payer: Self-pay | Admitting: Family

## 2021-08-19 DIAGNOSIS — E1142 Type 2 diabetes mellitus with diabetic polyneuropathy: Secondary | ICD-10-CM

## 2021-08-26 ENCOUNTER — Telehealth: Payer: Self-pay | Admitting: Family

## 2021-08-26 DIAGNOSIS — E1142 Type 2 diabetes mellitus with diabetic polyneuropathy: Secondary | ICD-10-CM

## 2021-08-26 MED ORDER — GABAPENTIN 300 MG PO CAPS: ORAL_CAPSULE | ORAL | 1 refills | Status: DC

## 2021-08-26 NOTE — Telephone Encounter (Signed)
SENT TO THE DRUG STORE. LEFT MESSAGE FOR PATIENT.

## 2021-09-18 ENCOUNTER — Other Ambulatory Visit: Payer: Self-pay | Admitting: Family

## 2021-09-18 DIAGNOSIS — N401 Enlarged prostate with lower urinary tract symptoms: Secondary | ICD-10-CM

## 2021-09-18 DIAGNOSIS — E1159 Type 2 diabetes mellitus with other circulatory complications: Secondary | ICD-10-CM

## 2021-09-18 DIAGNOSIS — I152 Hypertension secondary to endocrine disorders: Secondary | ICD-10-CM

## 2021-09-18 DIAGNOSIS — E1165 Type 2 diabetes mellitus with hyperglycemia: Secondary | ICD-10-CM

## 2021-09-18 DIAGNOSIS — E1142 Type 2 diabetes mellitus with diabetic polyneuropathy: Secondary | ICD-10-CM

## 2021-09-19 ENCOUNTER — Encounter: Payer: Self-pay | Admitting: Family

## 2021-09-19 ENCOUNTER — Ambulatory Visit (INDEPENDENT_AMBULATORY_CARE_PROVIDER_SITE_OTHER): Payer: Medicare HMO | Admitting: Family

## 2021-09-19 VITALS — BP 129/65 | HR 64 | Temp 98.6°F | Ht 69.5 in | Wt 180.4 lb

## 2021-09-19 DIAGNOSIS — K047 Periapical abscess without sinus: Secondary | ICD-10-CM

## 2021-09-19 DIAGNOSIS — K0889 Other specified disorders of teeth and supporting structures: Secondary | ICD-10-CM

## 2021-09-19 DIAGNOSIS — L918 Other hypertrophic disorders of the skin: Secondary | ICD-10-CM

## 2021-09-19 DIAGNOSIS — E1142 Type 2 diabetes mellitus with diabetic polyneuropathy: Secondary | ICD-10-CM | POA: Diagnosis not present

## 2021-09-19 MED ORDER — GABAPENTIN 300 MG PO CAPS: ORAL_CAPSULE | ORAL | 2 refills | Status: DC

## 2021-09-19 MED ORDER — AMOXICILLIN-POT CLAVULANATE 875-125 MG PO TABS: 1.0000 | ORAL_TABLET | Freq: Two times a day (BID) | ORAL | 0 refills | Status: DC

## 2021-09-19 NOTE — Patient Instructions (Signed)
Skin Tag, Adult A skin tag (acrochordon) is a soft, extra growth of skin. Most skin tags are skin-colored and rarely bigger than a pencil eraser. They commonly form in areas where there is frequent rubbing, or friction, on the skin. This may be where there are folds in the skin, such as the eyelids, neck, armpit, or groin. Skin tags are not dangerous, and they do not spread from person to person (are not contagious). You may have one skin tag or several. Skin tags do not require treatment. However, your health care provider may recommend removal of a skin tag if it: Gets irritated from clothing or jewelry. Bleeds. Is visible and unsightly. What are the causes? This condition is linked with: Increasing age. Pregnancy. Diabetes. Obesity. What are the signs or symptoms? Skin tags usually do not cause symptoms unless they get irritated by items touching your skin, such as clothing or jewelry. When this happens, you may have pain, itching, or bleeding. How is this diagnosed? This condition is diagnosed with an evaluation from your health care provider. No testing is needed for diagnosis. How is this treated? Treatment for this condition depends on whether you have symptoms. If a skin tag needs to be removed, your health care provider can remove it with: A simple surgical procedure using scissors. A procedure that involves freezing your skin tag with a gas in liquid form (liquid nitrogen). A procedure that uses heat to destroy your skin tag (electrodessication). Your health care provider may also remove your skin tag if it is visible or unsightly, Follow these instructions at home: Watch for any changes in your skin tag. A normal skin tag does not require any other special care at home. Take over-the-counter and prescription medicines only as told by your health care provider. Keep all follow-up visits as told by your health care provider. This is important. Contact a health care provider  if: You have a skin tag that: Becomes painful. Changes color. Bleeds. Swells. Summary Skin tags are soft, extra growths of skin found in areas of frequent rubbing or friction. Skin tags usually do not cause symptoms. If symptoms occur, you may have pain, itching, or bleeding. If your skin tag causes symptoms or is unsightly, your health care provider can remove it. This information is not intended to replace advice given to you by your health care provider. Make sure you discuss any questions you have with your health care provider.  Dental Abscess A dental abscess is an infection around a tooth that may involve pain, swelling, and a collection of pus, as well as other symptoms. Treatment is important to help with symptoms and to prevent the infection from spreading. The general types of dental abscesses are: Pulpal abscess. This abscess may form from the inner part of the tooth (pulp). Periodontal abscess. This abscess may form from the gum. What are the causes? This condition is caused by a bacterial infection in or around the tooth. It may result from: Severe tooth decay (cavities). Trauma to the tooth, such as a broken or chipped tooth. What increases the risk? This condition is more likely to develop in males. It is also more likely to develop in people who: Have cavities. Have severe gum disease. Eat sugary snacks between meals. Use tobacco products. Have diabetes. Have a weakened disease-fighting system (immune system). Do not brush and care for their teeth regularly. What are the signs or symptoms? Mild symptoms of this condition include: Tenderness. Bad breath. Fever. A bitter taste in  the mouth. Pain in and around the infected tooth. Moderate symptoms of this condition include: Swollen neck glands. Chills. Pus drainage. Swelling and redness around the infected tooth, in the mouth, or in the face. Severe pain in and around the infected tooth. Severe symptoms of  this condition include: Difficulty swallowing. Difficulty opening the mouth. Nausea. Vomiting. How is this diagnosed? This condition is diagnosed based on: Your symptoms and your medical and dental history. An examination of the infected tooth. During the exam, your dental care provider may tap on the infected tooth. You may also need to have X-rays taken of the affected area. How is this treated? This condition is treated by getting rid of the infection. This may be done with: Antibiotic medicines. These may be used in certain situations. Antibacterial mouth rinse. Incision and drainage. This procedure is done by making an incision in the abscess to drain out the pus. Removing pus is the first priority in treating an abscess. A root canal. This may be performed to save the tooth. Your dental care provider accesses the visible part of your tooth (crown) with a drill and removes any infected pulp. Then the space is filled and sealed off. Tooth extraction. The tooth is pulled out if it cannot be saved by other treatment. You may also receive treatment for pain, such as: Acetaminophen or NSAIDs. Gels that contain a numbing medicine. An injection to block the pain near your nerve. Follow these instructions at home: Medicines Take over-the-counter and prescription medicines only as told by your dental care provider. If you were prescribed an antibiotic, take it as told by your dental care provider. Do not stop taking the antibiotic even if you start to feel better. If you were prescribed a gel that contains a numbing medicine, use it exactly as told in the directions. Do not use these gels for children who are younger than 86 years of age. Use an antibacterial mouth rinse as told by your dental care provider. General instructions  Gargle with a mixture of salt and water 3-4 times a day or as needed. To make salt water, completely dissolve -1 tsp (3-6 g) of salt in 1 cup (237 mL) of warm  water. Eat a soft diet while your abscess is healing. Drink enough fluid to keep your urine pale yellow. Do not apply heat to the outside of your mouth. Do not use any products that contain nicotine or tobacco. These products include cigarettes, chewing tobacco, and vaping devices, such as e-cigarettes. If you need help quitting, ask your dental care provider. Keep all follow-up visits. This is important. How is this prevented?  Excellent dental home care, which includes brushing your teeth every morning and night with fluoride toothpaste. Floss one time each day. Get regularly scheduled dental cleanings. Consider having a dental sealant applied on teeth that have deep grooves to prevent cavities. Drink fluoridated water regularly. This includes most tap water. Check the label on bottled water to see if it contains fluoride. Reduce or eliminate sugary drinks. Eat healthy meals and snacks. Wear a mouth guard or face shield to protect your teeth while playing sports. Contact a health care provider if: Your pain is worse and is not helped by medicine. You have swelling. You see pus around the tooth. You have a fever or chills. Get help right away if: Your symptoms suddenly get worse. You have a very bad headache. You have problems breathing or swallowing. You have trouble opening your mouth. You have swelling  in your neck or around your eye. These symptoms may represent a serious problem that is an emergency. Do not wait to see if the symptoms will go away. Get medical help right away. Call your local emergency services (911 in the [place] Do not drive yourself to the hospital. Summary A dental abscess is a collection of pus in or around a tooth that results from an infection. A dental abscess may result from severe tooth decay, trauma to the tooth, or severe gum disease around a tooth. Symptoms include severe pain, swelling, redness, and drainage of pus in and around the infected  tooth. The first priority in treating a dental abscess is to drain out the pus. Treatment may also involve removing damage inside the tooth (root canal) or extracting the tooth. This information is not intended to replace advice given to you by your health care provider. Make sure you discuss any questions you have with your health care provider. Document Revised: 09/20/2020 Document Reviewed: 09/20/2020 Elsevier Patient Education  2022 [employer] Inc.  Document Revised: 05/16/2019 Document Reviewed: 05/16/2019 Elsevier Patient Education  2022 [employer].

## 2021-09-19 NOTE — Progress Notes (Addendum)
Subjective:    Patient ID: Barry Derry., male    DOB: 10/22/1950, 71 y.o.   MRN: [id number]  Chief Complaint  Patient presents with   Nevus   Skin Tag   Dental Pain   PT presents to the office today for a skin tag on his lower back that has been there for years, but has been getting larger.   He reports he is having dental pain 10 out 10 on right lower jaw. He had a dentist appointment on 09/24/21, but then got rescheduled for 10/22/21. He is going to get them pulled.   Also, wants gabapentin increased. He is currently taking 600 mg TID vs 300 mg TID. States he would like to say on 300 mg because some times he only takes 300 mg if his pain is doing ok. He has peripheral neuropathy with burning pain of 7 out 10.  Dental Pain  This is a new problem. The current episode started 1 to 4 weeks ago. The problem has been gradually worsening. The pain is moderate. Associated symptoms include facial pain and sinus pressure. Pertinent negatives include no difficulty swallowing or fever. He has tried acetaminophen, NSAIDs and rest for the symptoms. The treatment provided mild relief.     Review of Systems  Constitutional:  Negative for fever.  HENT:  Positive for sinus pressure.   All other systems reviewed and are negative.     Objective:   Physical Exam Vitals reviewed.  Constitutional:      General: He is not in acute distress.    Appearance: He is well-developed.  HENT:     Head: Normocephalic.     Mouth/Throat:     Dentition: Dental tenderness, gingival swelling, dental caries and dental abscesses present.     Comments: Right lower gum swollen  Eyes:     General:        Right eye: No discharge.        Left eye: No discharge.     Pupils: Pupils are equal, round, and reactive to light.  Neck:     Thyroid: No thyromegaly.  Cardiovascular:     Rate and Rhythm: Normal rate and regular rhythm.     Heart sounds: Normal heart sounds. No murmur heard. Pulmonary:     Effort:  Pulmonary effort is normal. No respiratory distress.     Breath sounds: Normal breath sounds. No wheezing.  Abdominal:     General: Bowel sounds are normal. There is no distension.     Palpations: Abdomen is soft.     Tenderness: There is no abdominal tenderness.  Musculoskeletal:        General: No tenderness. Normal range of motion.     Cervical back: Normal range of motion and neck supple.  Skin:    General: Skin is warm and dry.     Findings: No erythema or rash.          Comments: 0.5 mm skin tag on left lower back   Neurological:     Mental Status: He is alert and oriented to person, place, and time.     Cranial Nerves: No cranial nerve deficit.     Deep Tendon Reflexes: Reflexes are normal and symmetric.  Psychiatric:        Behavior: Behavior normal.        Thought Content: Thought content normal.        Judgment: Judgment normal.    Area cleaned and skin tag removed. Pt tolerated  well.   BP 129/65    Pulse 64    Temp 98.6 F (37 C) (Temporal)    Ht 5' 9.5" (1.765 m)    Wt 180 lb 6.4 oz (81.8 kg)    BMI 26.26 kg/m      Assessment & Plan:  Barry Dey Sr. comes in today with chief complaint of Nevus, Skin Tag, and Dental Pain   Diagnosis and orders addressed:  1. Pain, dental Start Augmentin  Keep dentist follow up  - amoxicillin-clavulanate (AUGMENTIN) 875-125 MG tablet; Take 1 tablet by mouth 2 (two) times daily.  Dispense: 20 tablet; Refill: 0  2. Dental abscess  - amoxicillin-clavulanate (AUGMENTIN) 875-125 MG tablet; Take 1 tablet by mouth 2 (two) times daily.  Dispense: 20 tablet; Refill: 0  3. Skin tag Removed Keep area clean and dry   4. Diabetic polyneuropathy associated with type 2 diabetes mellitus (HCC) Will increase gabapentin  - gabapentin (NEURONTIN) 300 MG capsule; Take 600 mg TID  Dispense: 540 capsule; Refill: 2    Health Maintenance reviewed Diet and exercise encouraged  Follow up plan: As needed   Evelina Dun,  FNP

## 2021-09-26 ENCOUNTER — Telehealth: Payer: Self-pay | Admitting: Family

## 2021-09-26 DIAGNOSIS — E559 Vitamin D deficiency, unspecified: Secondary | ICD-10-CM

## 2021-09-26 MED ORDER — VITAMIN D (ERGOCALCIFEROL) 1.25 MG (50000 UNIT) PO CAPS: ORAL_CAPSULE | ORAL | 4 refills | Status: DC

## 2021-09-26 NOTE — Telephone Encounter (Signed)
Sent to [facility]

## 2021-09-26 NOTE — Telephone Encounter (Signed)
Wife aware meds sent  ?

## 2021-10-20 ENCOUNTER — Emergency Department (HOSPITAL_COMMUNITY)
Admission: EM | Admit: 2021-10-20 | Discharge: 2021-10-20 | Disposition: A | Discharge status: Home / Self Care | Payer: Medicare HMO | Attending: Emergency Medicine | Admitting: Emergency Medicine

## 2021-10-20 ENCOUNTER — Encounter (HOSPITAL_COMMUNITY): Payer: Self-pay

## 2021-10-20 ENCOUNTER — Emergency Department (HOSPITAL_COMMUNITY): Payer: Medicare HMO

## 2021-10-20 ENCOUNTER — Other Ambulatory Visit: Payer: Self-pay

## 2021-10-20 DIAGNOSIS — R509 Fever, unspecified: Secondary | ICD-10-CM | POA: Diagnosis not present

## 2021-10-20 DIAGNOSIS — R000 Tachycardia, unspecified: Secondary | ICD-10-CM | POA: Insufficient documentation

## 2021-10-20 DIAGNOSIS — R531 Weakness: Secondary | ICD-10-CM | POA: Diagnosis not present

## 2021-10-20 DIAGNOSIS — R0602 Shortness of breath: Secondary | ICD-10-CM | POA: Diagnosis not present

## 2021-10-20 DIAGNOSIS — E871 Hypo-osmolality and hyponatremia: Secondary | ICD-10-CM | POA: Insufficient documentation

## 2021-10-20 DIAGNOSIS — D72829 Elevated white blood cell count, unspecified: Secondary | ICD-10-CM | POA: Insufficient documentation

## 2021-10-20 DIAGNOSIS — Z7984 Long term (current) use of oral hypoglycemic drugs: Secondary | ICD-10-CM | POA: Diagnosis not present

## 2021-10-20 DIAGNOSIS — Z20822 Contact with and (suspected) exposure to covid-19: Secondary | ICD-10-CM | POA: Insufficient documentation

## 2021-10-20 DIAGNOSIS — E119 Type 2 diabetes mellitus without complications: Secondary | ICD-10-CM | POA: Diagnosis not present

## 2021-10-20 DIAGNOSIS — I10 Essential (primary) hypertension: Secondary | ICD-10-CM | POA: Diagnosis not present

## 2021-10-20 DIAGNOSIS — Z7982 Long term (current) use of aspirin: Secondary | ICD-10-CM | POA: Insufficient documentation

## 2021-10-20 LAB — COMPREHENSIVE METABOLIC PANEL
ALT: 26 U/L (ref 0–44)
AST: 26 U/L (ref 15–41)
Albumin: 3.7 g/dL (ref 3.5–5.0)
Alkaline Phosphatase: 98 U/L (ref 38–126)
Anion gap: 11 (ref 5–15)
BUN: 14 mg/dL (ref 8–23)
CO2: 22 mmol/L (ref 22–32)
Calcium: 9.2 mg/dL (ref 8.9–10.3)
Chloride: 100 mmol/L (ref 98–111)
Creatinine, Ser: 1.14 mg/dL (ref 0.61–1.24)
GFR, Estimated: >60 mL/min (ref >60)
Glucose, Bld: 111 mg/dL — ABNORMAL HIGH (ref 70–99)
Potassium: 3.8 mmol/L (ref 3.5–5.1)
Sodium: 133 mmol/L — ABNORMAL LOW (ref 135–145)
Total Bilirubin: 1 mg/dL (ref 0.3–1.2)
Total Protein: 7.4 g/dL (ref 6.5–8.1)

## 2021-10-20 LAB — URINALYSIS, ROUTINE W REFLEX MICROSCOPIC
Bacteria, UA: NONE SEEN
Bilirubin Urine: NEGATIVE
Glucose, UA: NEGATIVE mg/dL
Hgb urine dipstick: NEGATIVE
Ketones, ur: NEGATIVE mg/dL
Leukocytes,Ua: NEGATIVE
Nitrite: NEGATIVE
Protein, ur: 30 mg/dL — AB
Specific Gravity, Urine: 1.021 (ref 1.005–1.030)
pH: 5 (ref 5.0–8.0)

## 2021-10-20 LAB — CBC WITH DIFFERENTIAL/PLATELET
Abs Immature Granulocytes: 0.07 10*3/uL (ref 0.00–0.07)
Basophils Absolute: 0 10*3/uL (ref 0.0–0.1)
Basophils Relative: 0 %
Eosinophils Absolute: 0 10*3/uL (ref 0.0–0.5)
Eosinophils Relative: 0 %
HCT: 32.8 % — ABNORMAL LOW (ref 39.0–52.0)
Hemoglobin: 11.1 g/dL — ABNORMAL LOW (ref 13.0–17.0)
Immature Granulocytes: 1 %
Lymphocytes Relative: 11 %
Lymphs Abs: 1.2 10*3/uL (ref 0.7–4.0)
MCH: 28.5 pg (ref 26.0–34.0)
MCHC: 33.8 g/dL (ref 30.0–36.0)
MCV: 84.3 fL (ref 80.0–100.0)
Monocytes Absolute: 0.8 10*3/uL (ref 0.1–1.0)
Monocytes Relative: 8 %
Neutro Abs: 8.7 10*3/uL — ABNORMAL HIGH (ref 1.7–7.7)
Neutrophils Relative %: 80 %
Platelets: 257 10*3/uL (ref 150–400)
RBC: 3.89 MIL/uL — ABNORMAL LOW (ref 4.22–5.81)
RDW: 14.6 % (ref 11.5–15.5)
WBC: 10.9 10*3/uL — ABNORMAL HIGH (ref 4.0–10.5)
nRBC: 0 % (ref 0.0–0.2)

## 2021-10-20 LAB — RESP PANEL BY RT-PCR (FLU A&B, COVID) ARPGX2
Influenza A by PCR: NEGATIVE
Influenza B by PCR: NEGATIVE
SARS Coronavirus 2 by RT PCR: NEGATIVE

## 2021-10-20 LAB — PROTIME-INR
INR: 1.1 (ref 0.8–1.2)
Prothrombin Time: 14.5 seconds (ref 11.4–15.2)

## 2021-10-20 LAB — CBG MONITORING, ED: Glucose-Capillary: 97 mg/dL (ref 70–99)

## 2021-10-20 LAB — LACTIC ACID, PLASMA: Lactic Acid, Venous: 1.8 mmol/L (ref 0.5–1.9)

## 2021-10-20 MED ORDER — LACTATED RINGERS IV BOLUS
1000.0000 mL | Freq: Once | INTRAVENOUS | Status: AC
  Administered 2021-10-20: 1000 mL via INTRAVENOUS

## 2021-10-20 MED ORDER — IOHEXOL 350 MG/ML SOLN
75.0000 mL | Freq: Once | INTRAVENOUS | Status: AC | PRN
  Administered 2021-10-20: 75 mL via INTRAVENOUS

## 2021-10-20 MED ORDER — ACETAMINOPHEN 325 MG PO TABS
650.0000 mg | ORAL_TABLET | Freq: Once | ORAL | Status: AC
  Administered 2021-10-20: 650 mg via ORAL
  Filled 2021-10-20: qty 2

## 2021-10-20 NOTE — Discharge Instructions (Addendum)
It is unclear where your fever is coming from.  If you develop trouble breathing, headache, neck stiffness, chest or abdominal pain, back pain, burning with urination, rash, or any other new/concerning symptoms then return to the ER for evaluation.  Call your doctor tomorrow, ideally for an appointment in the next 24-48 hours. ?

## 2021-10-20 NOTE — ED Triage Notes (Signed)
Pt c/o weakness, decrease in po intake, sweating at night and fever.  ?

## 2021-10-20 NOTE — ED Notes (Signed)
Back from CT. Resting quietly. No signs of distress. Side rails up x 2. Call bell in reach.  ?

## 2021-10-20 NOTE — ED Provider Notes (Signed)
?[facility] ?Provider Note ? ? ?CSN: [id number] ?Arrival date & time: 10/20/21  1438 ? ?  ? ?History ? ?Chief Complaint  ?Patient presents with  ? Fever  ? ? ?Barry Jabree Rebert Sr. is a 71 y.o. male. ? ?HPI ?71 year old male with a history of diabetes, hypertension and hyperlipidemia presents with generalized weakness.  History is primarily from his wife at the bedside but he also participates.  His wife states he has been feeling bad and has been weak for about 3 weeks.  Intermittent sweating, mostly at night.  Seems to have had subjective fever over the last 3 days or so.  Today after coming home he was feeling so weak that his wife was able to convince him to come to the hospital.  Patient denies any headache, cough, abdominal pain, vomiting, or rash.  He feels generally weak.  He has chronic difficulties urinating and is on medicine for BPH.  However it does not really seem like any dysuria. Arrives to ED and his Temp is 102.7.  ? ?Home Medications ?Prior to Admission medications   ?Medication Sig Start Date End Date Taking? Authorizing Provider  ?amoxicillin-clavulanate (AUGMENTIN) 875-125 MG tablet Take 1 tablet by mouth 2 (two) times daily. 09/19/21   Sharion Balloon, FNP  ?aspirin 81 MG tablet Take 81 mg by mouth daily.    [provider]  ?atorvastatin (LIPITOR) 40 MG tablet TAKE ONE (1) TABLET EACH DAY 06/03/21   Sharion Balloon, FNP  ?Blood Glucose Calibration (TRUE METRIX LEVEL 1) Low SOLN Use with glucose monitor Dx E11.9 06/29/20   Sharion Balloon, FNP  ?blood glucose meter kit and supplies Dispense based on patient and insurance preference. Use up to four times daily as directed. (FOR ICD-10 E10.9, E11.9). 03/29/19   Sharion Balloon, FNP  ?Blood Glucose Monitoring Suppl (TRUE METRIX METER) w/Device KIT Test BS four times daily Dx E11.9 04/25/21   Sharion Balloon, FNP  ?cholecalciferol (VITAMIN D) 1000 UNITS tablet Take 1 tablet (1,000 Units total) by mouth daily. 08/10/14    Sharion Balloon, FNP  ?diclofenac (VOLTAREN) 75 MG EC tablet Take 1 tablet (75 mg total) by mouth 2 (two) times daily. 07/10/21   Sharion Balloon, FNP  ?gabapentin (NEURONTIN) 300 MG capsule Take 600 mg TID 09/19/21   Sharion Balloon, FNP  ?glimepiride (AMARYL) 2 MG tablet TAKE 1 TABLET (2 MG TOTAL) BY MOUTH EVERY MORNING. 07/12/21   Sharion Balloon, FNP  ?glucose blood (TRUE METRIX BLOOD GLUCOSE TEST) test strip TEST BLOOD SUGAR FOUR TIMES DAILY DX E11.40 07/12/21   Sharion Balloon, FNP  ?losartan (COZAAR) 100 MG tablet Take 1 tablet (100 mg total) by mouth daily. 06/03/21   Sharion Balloon, FNP  ?metFORMIN (GLUCOPHAGE) 500 MG tablet Take 2 tablets (1,000 mg total) by mouth every morning AND 3 tablets (1,500 mg total) at bedtime. 06/03/21   Sharion Balloon, FNP  ?Multiple Vitamin (MULTIVITAMIN) capsule Take 1 capsule by mouth daily.    [provider]  ?omega-3 acid ethyl esters (LOVAZA) 1 g capsule Take 2 capsules (2 g total) by mouth 2 (two) times daily. 08/02/20   Sharion Balloon, FNP  ?Omega-3 Fatty Acids (FISH OIL) 1200 MG CAPS Take 2 capsules by mouth daily.     [provider]  ?predniSONE (STERAPRED UNI-PAK 21 TAB) 10 MG (21) TBPK tablet Use as directed 07/10/21   Sharion Balloon, FNP  ?sitaGLIPtin (JANUVIA) 50 MG tablet TAKE  ONE (1) TABLET EACH DAY 06/03/21   Evelina Dun A, FNP  ?tamsulosin (FLOMAX) 0.4 MG CAPS capsule Take 1 capsule (0.4 mg total) by mouth daily. 06/03/21   Sharion Balloon, FNP  ?TRUEplus Lancets 33G MISC Test BS four times daily Dx E11.9 07/02/20   Sharion Balloon, FNP  ?Vitamin D, Ergocalciferol, (DRISDOL) 1.25 MG (50000 UNIT) CAPS capsule TAKE 1 CAPSULE EVERY WEEK 09/26/21   Sharion Balloon, FNP  ?   ? ?Allergies    ?Patient has no known allergies.   ? ?Review of Systems   ?Review of Systems  ?Constitutional:  Positive for chills, diaphoresis and fever.  ?HENT:  Positive for sore throat.   ?Respiratory:  Positive for shortness of breath. Negative for cough.    ?Gastrointestinal:  Negative for abdominal pain and vomiting.  ?Genitourinary:  Positive for difficulty urinating. Negative for dysuria.  ?Neurological:  Positive for weakness. Negative for headaches.  ? ?Physical Exam ?Updated Vital Signs ?BP (!) 115/59   Pulse 89   Temp 99.6 ?F (37.6 ?C) (Oral)   Resp 20   Ht 5' 6"  (1.676 m)   Wt 78 kg   SpO2 95%   BMI 27.76 kg/m?  ?Physical Exam ?Vitals and nursing note reviewed.  ?Constitutional:   ?   General: He is not in acute distress. ?   Appearance: He is well-developed. He is not ill-appearing or diaphoretic.  ?HENT:  ?   Head: Normocephalic and atraumatic.  ?   Mouth/Throat:  ?   Pharynx: No oropharyngeal exudate or posterior oropharyngeal erythema.  ?Eyes:  ?   Extraocular Movements: Extraocular movements intact.  ?   Pupils: Pupils are equal, round, and reactive to light.  ?Cardiovascular:  ?   Rate and Rhythm: Regular rhythm. Tachycardia present.  ?   Heart sounds: Normal heart sounds.  ?Pulmonary:  ?   Effort: Pulmonary effort is normal.  ?   Breath sounds: Normal breath sounds. No wheezing, rhonchi or rales.  ?Abdominal:  ?   Palpations: Abdomen is soft.  ?   Tenderness: There is no abdominal tenderness.  ?Musculoskeletal:  ?   Cervical back: Neck supple. No rigidity.  ?Skin: ?   General: Skin is warm and dry.  ?   Findings: No erythema or rash.  ?Neurological:  ?   Mental Status: He is alert and oriented to person, place, and time.  ?   Comments: CN 3-12 grossly intact. Seems generally weak but has 5/5 strength in all 4 extremities. Grossly normal sensation. Normal finger to nose.   ? ? ?ED Results / Procedures / Treatments   ?Labs ?(all labs ordered are listed, but only abnormal results are displayed) ?Labs Reviewed  ?COMPREHENSIVE METABOLIC PANEL - Abnormal; Notable for the following components:  ?    Result Value  ? Sodium 133 (*)   ? Glucose, Bld 111 (*)   ? All other components within normal limits  ?CBC WITH DIFFERENTIAL/PLATELET - Abnormal; Notable  for the following components:  ? WBC 10.9 (*)   ? RBC 3.89 (*)   ? Hemoglobin 11.1 (*)   ? HCT 32.8 (*)   ? Neutro Abs 8.7 (*)   ? All other components within normal limits  ?URINALYSIS, ROUTINE W REFLEX MICROSCOPIC - Abnormal; Notable for the following components:  ? Protein, ur 30 (*)   ? All other components within normal limits  ?CULTURE, BLOOD (ROUTINE X 2)  ?CULTURE, BLOOD (ROUTINE X 2)  ?RESP PANEL BY RT-PCR (FLU A&B,  COVID) [id number]  ?LACTIC ACID, PLASMA  ?PROTIME-INR  ?CBG MONITORING, ED  ? ? ?EKG ?EKG Interpretation ? ?Date/Time:  Sunday October 20 2021 15:16:48 EDT ?Ventricular Rate:  110 ?PR Interval:  150 ?QRS Duration: 84 ?QT Interval:  291 ?QTC Calculation: 394 ?R Axis:   63 ?Text Interpretation: Sinus tachycardia Probable left atrial enlargement no acute ST/T changes V4? quality is poor Confirmed by Sherwood Gambler [phone]) on 10/20/2021 3:50:48 PM ? ?Radiology ?DG Chest 2 View ? ?Result Date: 10/20/2021 ?CLINICAL DATA:  Weakness for 2 weeks COVID fever EXAM: CHEST - 2 VIEW COMPARISON:  09/23/2017 FINDINGS: The heart size and mediastinal contours are within normal limits. Both lungs are clear. The visualized skeletal structures are unremarkable. IMPRESSION: No active cardiopulmonary disease. Electronically Signed   By: Randa Ngo M.D.   On: 10/20/2021 15:28  ? ?CT Angio Chest PE W and/or Wo Contrast ? ?Result Date: 10/20/2021 ?CLINICAL DATA:  Weakness, diaphoresis, fever, shortness of breath EXAM: CT ANGIOGRAPHY CHEST WITH CONTRAST TECHNIQUE: Multidetector CT imaging of the chest was performed using the standard protocol during bolus administration of intravenous contrast. Multiplanar CT image reconstructions and MIPs were obtained to evaluate the vascular anatomy. RADIATION DOSE REDUCTION: This exam was performed according to the departmental dose-optimization program which includes automated exposure control, adjustment of the mA and/or kV according to patient size and/or use of iterative  reconstruction technique. CONTRAST:  5m OMNIPAQUE IOHEXOL 350 MG/ML SOLN COMPARISON:  10/20/2021, 11/20/2010 FINDINGS: Cardiovascular: This is a technically adequate evaluation of the pulmonary vasculature. No filling defec

## 2021-10-21 ENCOUNTER — Ambulatory Visit (INDEPENDENT_AMBULATORY_CARE_PROVIDER_SITE_OTHER): Payer: Medicare HMO | Admitting: Family Medicine

## 2021-10-21 ENCOUNTER — Other Ambulatory Visit: Payer: Self-pay | Admitting: Family Medicine

## 2021-10-21 ENCOUNTER — Encounter: Payer: Self-pay | Admitting: Family Medicine

## 2021-10-21 VITALS — BP 132/74 | HR 113 | Temp 98.2°F | Ht 66.0 in | Wt 176.0 lb

## 2021-10-21 DIAGNOSIS — R509 Fever, unspecified: Secondary | ICD-10-CM

## 2021-10-21 DIAGNOSIS — N3000 Acute cystitis without hematuria: Secondary | ICD-10-CM

## 2021-10-21 LAB — URINALYSIS, COMPLETE
Bilirubin, UA: NEGATIVE
Glucose, UA: NEGATIVE
Ketones, UA: NEGATIVE
Nitrite, UA: NEGATIVE
Specific Gravity, UA: 1.02 (ref 1.005–1.030)
Urobilinogen, Ur: 2 mg/dL — ABNORMAL HIGH (ref 0.2–1.0)
pH, UA: 7 (ref 5.0–7.5)

## 2021-10-21 LAB — MICROSCOPIC EXAMINATION
Epithelial Cells (non renal): NONE SEEN /hpf (ref 0–10)
Renal Epithel, UA: NONE SEEN /hpf
WBC, UA: >30 /hpf — AB (ref 0–5)

## 2021-10-21 MED ORDER — CEPHALEXIN 500 MG PO CAPS: 500.0000 mg | ORAL_CAPSULE | Freq: Four times a day (QID) | ORAL | 0 refills | Status: DC

## 2021-10-21 NOTE — Progress Notes (Signed)
Lmtcb.

## 2021-10-21 NOTE — Progress Notes (Signed)
? ?BP 132/74   Pulse (!) 113   Temp 98.2 ?F (36.8 ?C)   Ht 5' 6"  (1.676 m)   Wt 176 lb (79.8 kg)   SpO2 97%   BMI 28.41 kg/m?   ? ?Subjective:  ? ?Patient ID: Barry Derry., male    DOB: [birth year], 71 y.o.   MRN: [id number] ? ?HPI: ?Barry Buchberger. is a 71 y.o. male presenting on 10/21/2021 for Fatigue and Shortness of Breath (Ran 105 temp over the weekend. Has oral surgery tomorrow to remove two teeth. Wants to make sure he is ok to keep appt) ? ? ?HPI ?Patient is coming in today with complaints of progressing with fevers and chills over the past few days and then progressively feeling weaker over the past month.  He says he feels like he has decreased energy and is just giving out.  He says he has been treated on multiple occasions for dental infections and he was taking Augmentin but stopped it about a week ago and the symptoms started a few days ago.  He went into the ER 2 days ago and had an extensive work-up including a CT chest for PE and blood cultures and CBC which was mildly elevated on the white blood cell count and electrolytes and flu and COVID testing and urinalysis and they did not find any signs of infection.  He has been fighting chronic dental infections and is going in tomorrow to have both of his bad teeth removed. ? ?Relevant past medical, surgical, family and social history reviewed and updated as indicated. Interim medical history since our last visit reviewed. ?Allergies and medications reviewed and updated. ? ?Review of Systems  ?Constitutional:  Positive for fatigue and fever. Negative for chills.  ?HENT:  Negative for congestion, ear discharge, ear pain, postnasal drip, rhinorrhea, sinus pressure, sneezing, sore throat and voice change.   ?Eyes:  Negative for pain, discharge, redness and visual disturbance.  ?Respiratory:  Negative for cough, chest tightness, shortness of breath and wheezing.   ?Cardiovascular:  Negative for chest pain, palpitations and leg swelling.   ?Gastrointestinal:  Negative for abdominal pain, constipation, diarrhea, nausea and vomiting.  ?Genitourinary:  Positive for frequency (Has been having frequency for a while because of his prostate and feels like he is not completely emptying sometimes). Negative for dysuria and urgency.  ?Musculoskeletal:  Negative for gait problem.  ?Skin:  Negative for rash.  ?Neurological:  Positive for weakness. Negative for dizziness, numbness and headaches.  ?All other systems reviewed and are negative. ? ?Per HPI unless specifically indicated above ? ? ?Allergies as of 10/21/2021   ?No Known Allergies ?  ? ?  ?Medication List  ?  ? ?  ? Accurate as of October 21, 2021  2:24 PM. If you have any questions, ask your nurse or doctor.  ?  ?  ? ?  ? ?amoxicillin-clavulanate 875-125 MG tablet ?Commonly known as: AUGMENTIN ?Take 1 tablet by mouth 2 (two) times daily. ?  ?aspirin 81 MG tablet ?Take 81 mg by mouth daily. ?  ?atorvastatin 40 MG tablet ?Commonly known as: LIPITOR ?TAKE ONE (1) TABLET EACH DAY ?  ?blood glucose meter kit and supplies ?Dispense based on patient and insurance preference. Use up to four times daily as directed. (FOR ICD-10 E10.9, E11.9). ?  ?cholecalciferol 1000 units tablet ?Commonly known as: VITAMIN D ?Take 1 tablet (1,000 Units total) by mouth daily. ?  ?diclofenac 75 MG EC tablet ?Commonly known as: VOLTAREN ?Take  1 tablet (75 mg total) by mouth 2 (two) times daily. ?  ?Fish Oil 1200 MG Caps ?Take 2 capsules by mouth daily. ?  ?gabapentin 300 MG capsule ?Commonly known as: NEURONTIN ?Take 600 mg TID ?  ?glimepiride 2 MG tablet ?Commonly known as: AMARYL ?TAKE 1 TABLET (2 MG TOTAL) BY MOUTH EVERY MORNING. ?  ?losartan 100 MG tablet ?Commonly known as: COZAAR ?Take 1 tablet (100 mg total) by mouth daily. ?  ?metFORMIN 500 MG tablet ?Commonly known as: GLUCOPHAGE ?Take 2 tablets (1,000 mg total) by mouth every morning AND 3 tablets (1,500 mg total) at bedtime. ?  ?multivitamin capsule ?Take 1 capsule by  mouth daily. ?  ?omega-3 acid ethyl esters 1 g capsule ?Commonly known as: Lovaza ?Take 2 capsules (2 g total) by mouth 2 (two) times daily. ?  ?predniSONE 10 MG (21) Tbpk tablet ?Commonly known as: STERAPRED UNI-PAK 21 TAB ?Use as directed ?  ?sitaGLIPtin 50 MG tablet ?Commonly known as: Januvia ?TAKE ONE (1) TABLET EACH DAY ?  ?tamsulosin 0.4 MG Caps capsule ?Commonly known as: FLOMAX ?Take 1 capsule (0.4 mg total) by mouth daily. ?  ?True Metrix Blood Glucose Test test strip ?Generic drug: glucose blood ?TEST BLOOD SUGAR FOUR TIMES DAILY DX E11.40 ?  ?True Metrix Level 1 Low Soln ?Use with glucose monitor Dx E11.9 ?  ?True Metrix Meter w/Device Kit ?Test BS four times daily Dx E11.9 ?  ?TRUEplus Lancets 33G Misc ?Test BS four times daily Dx E11.9 ?  ?Vitamin D (Ergocalciferol) 1.25 MG (50000 UNIT) Caps capsule ?Commonly known as: DRISDOL ?TAKE 1 CAPSULE EVERY WEEK ?  ? ?  ? ? ? ?Objective:  ? ?BP 132/74   Pulse (!) 113   Temp 98.2 ?F (36.8 ?C)   Ht $R'5\' 6"'bC$  (1.676 m)   Wt 176 lb (79.8 kg)   SpO2 97%   BMI 28.41 kg/m?   ?Wt Readings from Last 3 Encounters:  ?10/21/21 176 lb (79.8 kg)  ?10/20/21 172 lb (78 kg)  ?09/19/21 180 lb 6.4 oz (81.8 kg)  ?  ?Physical Exam ?Vitals and nursing note reviewed.  ?Constitutional:   ?   General: He is not in acute distress. ?   Appearance: He is well-developed. He is not diaphoretic.  ?Eyes:  ?   General: No scleral icterus. ?   Conjunctiva/sclera: Conjunctivae normal.  ?Neck:  ?   Thyroid: No thyromegaly.  ?Cardiovascular:  ?   Rate and Rhythm: Normal rate and regular rhythm.  ?   Heart sounds: Normal heart sounds. No murmur heard. ?Pulmonary:  ?   Effort: Pulmonary effort is normal. No respiratory distress.  ?   Breath sounds: Normal breath sounds. No wheezing.  ?Abdominal:  ?   General: Abdomen is flat. Bowel sounds are normal. There is no distension.  ?   Tenderness: There is no abdominal tenderness. There is no right CVA tenderness, left CVA tenderness, guarding or  rebound.  ?Musculoskeletal:     ?   General: Normal range of motion.  ?   Cervical back: Neck supple.  ?Lymphadenopathy:  ?   Cervical: No cervical adenopathy.  ?Skin: ?   General: Skin is warm and dry.  ?   Findings: No rash.  ?Neurological:  ?   Mental Status: He is alert and oriented to person, place, and time.  ?   Coordination: Coordination normal.  ?Psychiatric:     ?   Behavior: Behavior normal.  ? ? ? ? ?Assessment & Plan:  ? ?  Problem List Items Addressed This Visit   ?None ?Visit Diagnoses   ? ? Fever of unknown origin    -  Primary  ? Relevant Orders  ? CBC with Differential/Platelet  ? Urinalysis, Complete  ? Urine Culture  ? ?  ?  ?Recommended for him to restart his amoxicillin clavulanic acid that he has.  We will have him leave a urine and a CBC and repeat that today. ? ?Have a feeling that a lot of his symptoms are related to this chronic dental infection and will be resolved once it is repaired tomorrow ? ?If he still has symptoms after that then please call us back ?Follow up plan: ?Return if symptoms worsen or fail to improve. ? ?Counseling provided for all of the vaccine components ?Orders Placed This Encounter  ?Procedures  ? Urine Culture  ? CBC with Differential/Platelet  ? Urinalysis, Complete  ? ? ?Caryl Pina, MD ?[facility] ?10/21/2021, 2:24 PM ? ? ? ? ?

## 2021-10-21 NOTE — Progress Notes (Signed)
Please let patient know that it does look like he has urinary tract infection, I sent Keflex in for him to treat and we will monitor his symptoms from there. ?

## 2021-10-22 ENCOUNTER — Telehealth: Payer: Self-pay | Admitting: Family

## 2021-10-22 DIAGNOSIS — R69 Illness, unspecified: Secondary | ICD-10-CM | POA: Diagnosis not present

## 2021-10-22 LAB — CBC WITH DIFFERENTIAL/PLATELET
Basophils Absolute: 0 10*3/uL (ref 0.0–0.2)
Basos: 1 %
EOS (ABSOLUTE): 0.2 10*3/uL (ref 0.0–0.4)
Eos: 3 %
Hematocrit: 35.2 % — ABNORMAL LOW (ref 37.5–51.0)
Hemoglobin: 12 g/dL — ABNORMAL LOW (ref 13.0–17.7)
Immature Grans (Abs): 0.1 10*3/uL (ref 0.0–0.1)
Immature Granulocytes: 1 %
Lymphocytes Absolute: 1.2 10*3/uL (ref 0.7–3.1)
Lymphs: 15 %
MCH: 28.4 pg (ref 26.6–33.0)
MCHC: 34.1 g/dL (ref 31.5–35.7)
MCV: 83 fL (ref 79–97)
Monocytes Absolute: 1 10*3/uL — ABNORMAL HIGH (ref 0.1–0.9)
Monocytes: 12 %
Neutrophils Absolute: 5.5 10*3/uL (ref 1.4–7.0)
Neutrophils: 68 %
Platelets: 297 10*3/uL (ref 150–450)
RBC: 4.23 x10E6/uL (ref 4.14–5.80)
RDW: 13.8 % (ref 11.6–15.4)
WBC: 8 10*3/uL (ref 3.4–10.8)

## 2021-10-22 NOTE — Progress Notes (Signed)
Left message for pt to return call if needed. ? ?Messaged via Mychart about urine results and treatment. Informed that lab work has not been reviewed at this time but we will call or message once they are. ?

## 2021-10-22 NOTE — Telephone Encounter (Signed)
Urine positive for UTI. Start antibiotic given from Dr. Keturah Barre. CBC stable. ?

## 2021-10-22 NOTE — Telephone Encounter (Signed)
Lmtcb.

## 2021-10-25 LAB — CULTURE, BLOOD (ROUTINE X 2)
Culture: NO GROWTH
Culture: NO GROWTH
Special Requests: ADEQUATE

## 2021-10-26 LAB — URINE CULTURE

## 2021-10-30 NOTE — Telephone Encounter (Signed)
Patient aware by labs from [facility]  ?

## 2021-11-01 ENCOUNTER — Other Ambulatory Visit: Payer: Self-pay | Admitting: Family

## 2021-11-01 DIAGNOSIS — E1142 Type 2 diabetes mellitus with diabetic polyneuropathy: Secondary | ICD-10-CM

## 2021-11-01 DIAGNOSIS — E1165 Type 2 diabetes mellitus with hyperglycemia: Secondary | ICD-10-CM

## 2021-11-01 DIAGNOSIS — E1159 Type 2 diabetes mellitus with other circulatory complications: Secondary | ICD-10-CM

## 2021-11-01 DIAGNOSIS — I152 Hypertension secondary to endocrine disorders: Secondary | ICD-10-CM

## 2021-11-01 DIAGNOSIS — N401 Enlarged prostate with lower urinary tract symptoms: Secondary | ICD-10-CM

## 2021-11-28 ENCOUNTER — Ambulatory Visit: Payer: Medicare HMO | Admitting: Family

## 2021-11-28 ENCOUNTER — Ambulatory Visit: Payer: Medicare HMO

## 2021-11-28 ENCOUNTER — Encounter: Payer: Self-pay | Admitting: Family

## 2021-11-28 ENCOUNTER — Ambulatory Visit (INDEPENDENT_AMBULATORY_CARE_PROVIDER_SITE_OTHER): Payer: Medicare HMO | Admitting: Family

## 2021-11-28 VITALS — BP 119/58 | HR 74 | Temp 97.6°F | Resp 16 | Ht 66.0 in | Wt 181.4 lb

## 2021-11-28 DIAGNOSIS — E1165 Type 2 diabetes mellitus with hyperglycemia: Secondary | ICD-10-CM

## 2021-11-28 DIAGNOSIS — E1142 Type 2 diabetes mellitus with diabetic polyneuropathy: Secondary | ICD-10-CM

## 2021-11-28 DIAGNOSIS — Z0001 Encounter for general adult medical examination with abnormal findings: Secondary | ICD-10-CM | POA: Diagnosis not present

## 2021-11-28 DIAGNOSIS — G4700 Insomnia, unspecified: Secondary | ICD-10-CM

## 2021-11-28 DIAGNOSIS — I152 Hypertension secondary to endocrine disorders: Secondary | ICD-10-CM

## 2021-11-28 DIAGNOSIS — E1169 Type 2 diabetes mellitus with other specified complication: Secondary | ICD-10-CM | POA: Diagnosis not present

## 2021-11-28 DIAGNOSIS — E559 Vitamin D deficiency, unspecified: Secondary | ICD-10-CM | POA: Diagnosis not present

## 2021-11-28 DIAGNOSIS — N401 Enlarged prostate with lower urinary tract symptoms: Secondary | ICD-10-CM

## 2021-11-28 DIAGNOSIS — Z0000 Encounter for general adult medical examination without abnormal findings: Secondary | ICD-10-CM

## 2021-11-28 DIAGNOSIS — E663 Overweight: Secondary | ICD-10-CM | POA: Diagnosis not present

## 2021-11-28 DIAGNOSIS — E1159 Type 2 diabetes mellitus with other circulatory complications: Secondary | ICD-10-CM

## 2021-11-28 DIAGNOSIS — M19019 Primary osteoarthritis, unspecified shoulder: Secondary | ICD-10-CM

## 2021-11-28 DIAGNOSIS — E785 Hyperlipidemia, unspecified: Secondary | ICD-10-CM

## 2021-11-28 LAB — BAYER DCA HB A1C WAIVED: HB A1C (BAYER DCA - WAIVED): 6.2 % — ABNORMAL HIGH (ref 4.8–5.6)

## 2021-11-28 MED ORDER — SITAGLIPTIN PHOSPHATE 50 MG PO TABS: ORAL_TABLET | ORAL | 3 refills | Status: DC

## 2021-11-28 MED ORDER — METFORMIN HCL 500 MG PO TABS: ORAL_TABLET | ORAL | 4 refills | Status: DC

## 2021-11-28 MED ORDER — ATORVASTATIN CALCIUM 40 MG PO TABS: ORAL_TABLET | ORAL | 2 refills | Status: DC

## 2021-11-28 MED ORDER — GABAPENTIN 300 MG PO CAPS: ORAL_CAPSULE | ORAL | 2 refills | Status: DC

## 2021-11-28 MED ORDER — GLIMEPIRIDE 2 MG PO TABS: 2.0000 mg | ORAL_TABLET | Freq: Every morning | ORAL | 1 refills | Status: DC

## 2021-11-28 MED ORDER — DICLOFENAC SODIUM 75 MG PO TBEC: 75.0000 mg | DELAYED_RELEASE_TABLET | Freq: Two times a day (BID) | ORAL | 0 refills | Status: DC

## 2021-11-28 MED ORDER — LOSARTAN POTASSIUM 100 MG PO TABS: 100.0000 mg | ORAL_TABLET | Freq: Every day | ORAL | 4 refills | Status: DC

## 2021-11-28 MED ORDER — TAMSULOSIN HCL 0.4 MG PO CAPS: 0.4000 mg | ORAL_CAPSULE | Freq: Every day | ORAL | 4 refills | Status: DC

## 2021-11-28 NOTE — Patient Instructions (Signed)

## 2021-11-28 NOTE — Progress Notes (Signed)
? ?Subjective:  ? ? Patient ID: Barry Derry., male    DOB: 08-16-1950, 71 y.o.   MRN: [id number] ? ?Chief Complaint  ?Patient presents with  ? Medical Management of Chronic Issues  ? ?PT presents to the office today for CPE and chronic follow up. He is complaining of fatigue.  ?Hypertension ?This is a chronic problem. The current episode started more than 1 year ago. The problem has been resolved since onset. The problem is controlled. Pertinent negatives include no blurred vision, malaise/fatigue, peripheral edema or shortness of breath. Risk factors for coronary artery disease include dyslipidemia, diabetes mellitus, obesity, male gender and sedentary lifestyle. The current treatment provides moderate improvement. Hypertensive end-organ damage includes kidney disease. There is no history of heart failure.  ?Hyperlipidemia ?This is a chronic problem. The current episode started more than 1 year ago. The problem is controlled. Recent lipid tests were reviewed and are normal. Exacerbating diseases include obesity. Pertinent negatives include no shortness of breath. Current antihyperlipidemic treatment includes statins. The current treatment provides moderate improvement of lipids. Risk factors for coronary artery disease include dyslipidemia, diabetes mellitus, male sex, hypertension and a sedentary lifestyle.  ?Diabetes ?He presents for his follow-up diabetic visit. He has type 2 diabetes mellitus. Associated symptoms include foot paresthesias. Pertinent negatives for diabetes include no blurred vision. Symptoms are stable. Diabetic complications include heart disease and peripheral neuropathy. Risk factors for coronary artery disease include dyslipidemia, diabetes mellitus, hypertension, male sex and sedentary lifestyle. He is following a generally healthy diet. His overall blood glucose range is 110-130 mg/dl. Eye exam is not current.  ?Benign Prostatic Hypertrophy ?This is a chronic problem. The current  episode started more than 1 year ago. The problem has been resolved since onset. Irritative symptoms do not include nocturia. Obstructive symptoms do not include straining.  ?Insomnia ?Primary symptoms: difficulty falling asleep, frequent awakening, no malaise/fatigue.   ?The current episode started more than one year. The onset quality is gradual. The problem occurs intermittently.  ? ? ? ?Review of Systems  ?Constitutional:  Negative for malaise/fatigue.  ?Eyes:  Negative for blurred vision.  ?Respiratory:  Negative for shortness of breath.   ?Genitourinary:  Negative for nocturia.  ?Psychiatric/Behavioral:  The patient has insomnia.   ?All other systems reviewed and are negative. ? ?Family History  ?Problem Relation Age of Onset  ? Diabetes Father   ? Diabetes Sister   ? Pneumonia Brother   ? Diabetes Sister   ? Diabetes Sister   ? Diabetes Sister   ? Diabetes Sister   ? Diabetes Sister   ? Cancer Sister   ? Diabetes Brother   ? Diabetes Brother   ? Diabetes Brother   ? Diabetes Brother   ? Diabetes Brother   ? ?Social History  ? ?Socioeconomic History  ? Marital status: Married  ?  Spouse name: Tye [place]  ? Number of children: Not on file  ? Years of education: Not on file  ? Highest education level: Not on file  ?Occupational History  ? Occupation: Tourist information centre manager  ?  Comment: full time  ?Tobacco Use  ? Smoking status: Never  ? Smokeless tobacco: Never  ?Vaping Use  ? Vaping Use: Never used  ?Substance and Sexual Activity  ? Alcohol use: No  ? Drug use: No  ? Sexual activity: Yes  ?Other Topics Concern  ? Not on file  ?Social History Narrative  ? Lives on one level with his wife - daughter lives in  , son next door  ? ?Social Determinants of Health  ? ?Financial Resource Strain: Low Risk   ? Difficulty of Paying Living Expenses: Not very hard  ?Food Insecurity: No Food Insecurity  ? Worried About Charity fundraiser in the Last Year: Never true  ? Ran Out of Food in the Last Year: Never true   ?Transportation Needs: No Transportation Needs  ? Lack of Transportation (Medical): No  ? Lack of Transportation (Non-Medical): No  ?Physical Activity: Sufficiently Active  ? Days of Exercise per Week: 5 days  ? Minutes of Exercise per Session: 60 min  ?Stress: No Stress Concern Present  ? Feeling of Stress : Not at all  ?Social Connections: Moderately Isolated  ? Frequency of Communication with Friends and Family: More than three times a week  ? Frequency of Social Gatherings with Friends and Family: Three times a week  ? Attends Religious Services: Never  ? Active Member of Clubs or Organizations: No  ? Attends Archivist Meetings: Never  ? Marital Status: Married  ? ? ?   ?Objective:  ? Physical Exam ?Vitals reviewed.  ?Constitutional:   ?   General: He is not in acute distress. ?   Appearance: He is well-developed. He is obese.  ?HENT:  ?   Head: Normocephalic.  ?   Right Ear: Tympanic membrane normal.  ?   Left Ear: Tympanic membrane normal.  ?Eyes:  ?   General:     ?   Right eye: No discharge.     ?   Left eye: No discharge.  ?   Pupils: Pupils are equal, round, and reactive to light.  ?Neck:  ?   Thyroid: No thyromegaly.  ?Cardiovascular:  ?   Rate and Rhythm: Normal rate and regular rhythm.  ?   Heart sounds: Normal heart sounds. No murmur heard. ?Pulmonary:  ?   Effort: Pulmonary effort is normal. No respiratory distress.  ?   Breath sounds: Normal breath sounds. No wheezing.  ?Abdominal:  ?   General: Bowel sounds are normal. There is no distension.  ?   Palpations: Abdomen is soft.  ?   Tenderness: There is no abdominal tenderness.  ?Musculoskeletal:     ?   General: No tenderness. Normal range of motion.  ?   Cervical back: Normal range of motion and neck supple.  ?Skin: ?   General: Skin is warm and dry.  ?   Findings: No erythema or rash.  ?Neurological:  ?   Mental Status: He is alert and oriented to person, place, and time.  ?   Cranial Nerves: No cranial nerve deficit.  ?   Deep Tendon  Reflexes: Reflexes are normal and symmetric.  ?Psychiatric:     ?   Behavior: Behavior normal.     ?   Thought Content: Thought content normal.     ?   Judgment: Judgment normal.  ? ?Diabetic Foot Exam - Simple   ?Simple Foot Form ?Diabetic Foot exam was performed with the following findings: Yes 11/28/2021  8:37 AM  ?Visual Inspection ?No deformities, no ulcerations, no other skin breakdown bilaterally: Yes ?Sensation Testing ?Intact to touch and monofilament testing bilaterally: Yes ?Pulse Check ?Posterior Tibialis and Dorsalis pulse intact bilaterally: Yes ?Comments ?  ? ? ? ? ?BP (!) 119/58   Pulse 74   Temp 97.6 ?F (36.4 ?C)   Resp 16   Ht _0  (1.676 m)   Wt 181 lb 6.4 oz (  82.3 kg)   SpO2 97%   BMI 29.28 kg/m?  ? ?   ?Assessment & Plan:  ?Crisanto Nied Sr. comes in today with chief complaint of Medical Management of Chronic Issues ? ? ?Diagnosis and orders addressed: ? ?1. Annual physical exam ?- Bayer DCA Hb A1c Waived ?- CMP14+EGFR ?- CBC with Differential/Platelet ?- Lipid panel ?- PSA, total and free ?- TSH ? ?2. Hypertension associated with diabetes ([place]) ?- CMP14+EGFR ?- CBC with Differential/Platelet ? ?3. Hyperlipidemia associated with type 2 diabetes mellitus ([place]) ?- CMP14+EGFR ?- CBC with Differential/Platelet ?- Lipid panel ? ?4. Type 2 diabetes mellitus with diabetic polyneuropathy, without long-term current use of insulin ([place]) ?- Bayer DCA Hb A1c Waived ?- CMP14+EGFR ?- CBC with Differential/Platelet ?- Microalbumin / creatinine urine ratio ? ?5. Diabetic polyneuropathy associated with type 2 diabetes mellitus ([place]) ?- CMP14+EGFR ?- CBC with Differential/Platelet ? ?6. Benign prostatic hyperplasia with lower urinary tract symptoms, symptom details unspecified ?- CMP14+EGFR ?- CBC with Differential/Platelet ?- PSA, total and free ? ?7. Insomnia, unspecified type ?- CMP14+EGFR ?- CBC with Differential/Platelet ? ?8. Overweight (BMI 25.0-29.9) ?- CMP14+EGFR ?- CBC with  Differential/Platelet ? ?9. Vitamin D deficiency ?- CMP14+EGFR ?- CBC with Differential/Platelet ?- VITAMIN D 25 Hydroxy (Vit-D Deficiency, Fractures) ? ? ?Labs pending ?Health Maintenance reviewed ?Diet and exercise encouraged ?

## 2021-11-29 LAB — PSA, TOTAL AND FREE
PSA, Free Pct: 10 %
PSA, Free: 0.26 ng/mL
Prostate Specific Ag, Serum: 2.6 ng/mL (ref 0.0–4.0)

## 2021-11-29 LAB — CMP14+EGFR
ALT: 26 IU/L (ref 0–44)
AST: 20 IU/L (ref 0–40)
Albumin/Globulin Ratio: 2 (ref 1.2–2.2)
Albumin: 4.5 g/dL (ref 3.7–4.7)
Alkaline Phosphatase: 65 IU/L (ref 44–121)
BUN/Creatinine Ratio: 16 (ref 10–24)
BUN: 13 mg/dL (ref 8–27)
Bilirubin Total: 0.3 mg/dL (ref 0.0–1.2)
CO2: 22 mmol/L (ref 20–29)
Calcium: 9.5 mg/dL (ref 8.6–10.2)
Chloride: 101 mmol/L (ref 96–106)
Creatinine, Ser: 0.8 mg/dL (ref 0.76–1.27)
Globulin, Total: 2.2 g/dL (ref 1.5–4.5)
Glucose: 153 mg/dL — ABNORMAL HIGH (ref 70–99)
Potassium: 4.4 mmol/L (ref 3.5–5.2)
Sodium: 140 mmol/L (ref 134–144)
Total Protein: 6.7 g/dL (ref 6.0–8.5)
eGFR: 95 mL/min/{1.73_m2} (ref >59)

## 2021-11-29 LAB — MICROALBUMIN / CREATININE URINE RATIO
Creatinine, Urine: 112.2 mg/dL
Microalb/Creat Ratio: 15 mg/g creat (ref 0–29)
Microalbumin, Urine: 16.4 ug/mL

## 2021-11-29 LAB — LIPID PANEL
Chol/HDL Ratio: 3.1 ratio (ref 0.0–5.0)
Cholesterol, Total: 123 mg/dL (ref 100–199)
HDL: 40 mg/dL (ref >39)
LDL Chol Calc (NIH): 67 mg/dL (ref 0–99)
Triglycerides: 83 mg/dL (ref 0–149)
VLDL Cholesterol Cal: 16 mg/dL (ref 5–40)

## 2021-11-29 LAB — CBC WITH DIFFERENTIAL/PLATELET
Basophils Absolute: 0 10*3/uL (ref 0.0–0.2)
Basos: 1 %
EOS (ABSOLUTE): 0.2 10*3/uL (ref 0.0–0.4)
Eos: 3 %
Hematocrit: 39.2 % (ref 37.5–51.0)
Hemoglobin: 12.9 g/dL — ABNORMAL LOW (ref 13.0–17.7)
Immature Grans (Abs): 0 10*3/uL (ref 0.0–0.1)
Immature Granulocytes: 0 %
Lymphocytes Absolute: 1.9 10*3/uL (ref 0.7–3.1)
Lymphs: 40 %
MCH: 28.4 pg (ref 26.6–33.0)
MCHC: 32.9 g/dL (ref 31.5–35.7)
MCV: 86 fL (ref 79–97)
Monocytes Absolute: 0.4 10*3/uL (ref 0.1–0.9)
Monocytes: 9 %
Neutrophils Absolute: 2.2 10*3/uL (ref 1.4–7.0)
Neutrophils: 47 %
Platelets: 294 10*3/uL (ref 150–450)
RBC: 4.55 x10E6/uL (ref 4.14–5.80)
RDW: 13.9 % (ref 11.6–15.4)
WBC: 4.7 10*3/uL (ref 3.4–10.8)

## 2021-11-29 LAB — VITAMIN D 25 HYDROXY (VIT D DEFICIENCY, FRACTURES): Vit D, 25-Hydroxy: 75.5 ng/mL (ref 30.0–100.0)

## 2021-11-29 LAB — TSH: TSH: 1.75 u[IU]/mL (ref 0.450–4.500)

## 2021-12-03 ENCOUNTER — Ambulatory Visit: Payer: Medicare HMO | Admitting: Family

## 2021-12-06 ENCOUNTER — Telehealth: Payer: Self-pay | Admitting: Family

## 2021-12-06 DIAGNOSIS — E1169 Type 2 diabetes mellitus with other specified complication: Secondary | ICD-10-CM

## 2021-12-06 DIAGNOSIS — E1142 Type 2 diabetes mellitus with diabetic polyneuropathy: Secondary | ICD-10-CM

## 2021-12-06 DIAGNOSIS — E559 Vitamin D deficiency, unspecified: Secondary | ICD-10-CM

## 2021-12-06 DIAGNOSIS — E1165 Type 2 diabetes mellitus with hyperglycemia: Secondary | ICD-10-CM

## 2021-12-06 MED ORDER — ATORVASTATIN CALCIUM 40 MG PO TABS: ORAL_TABLET | ORAL | 0 refills | Status: DC

## 2021-12-06 MED ORDER — VITAMIN D (ERGOCALCIFEROL) 1.25 MG (50000 UNIT) PO CAPS: ORAL_CAPSULE | ORAL | 0 refills | Status: DC

## 2021-12-06 MED ORDER — SITAGLIPTIN PHOSPHATE 50 MG PO TABS: ORAL_TABLET | ORAL | 0 refills | Status: DC

## 2021-12-06 NOTE — Addendum Note (Signed)
Addended by: Tamera Punt on: 12/06/2021 03:08 PM ? ? Modules accepted: Orders ? ?

## 2021-12-06 NOTE — Telephone Encounter (Signed)
?  Prescription Request ? ?12/06/2021 ? ?Is this a "Controlled Substance" medicine? no ? ?Have you seen your PCP in the last 2 weeks? yes ? ?If YES, route message to pool  -  If NO, patient needs to be scheduled for appointment. ? ?What is the name of the medication or equipment? Atorvastatin,Vitamin D & Januvia ? ?Have you contacted your pharmacy to request a refill? yes  ? ?Which pharmacy would you like this sent to? [facility] ? ? ?Patient notified that their request is being sent to the clinical staff for review and that they should receive a response within 2 business days.  ?  ?Lendon Colonel' pt. ? ?Please DO NOT send these to [facility] bc he can get these 3 meds cheaper at [facility]. ?

## 2022-01-06 ENCOUNTER — Other Ambulatory Visit: Payer: Self-pay | Admitting: Family

## 2022-01-06 DIAGNOSIS — E559 Vitamin D deficiency, unspecified: Secondary | ICD-10-CM

## 2022-02-18 ENCOUNTER — Telehealth: Payer: Self-pay | Admitting: Family

## 2022-02-18 NOTE — Telephone Encounter (Signed)
okay

## 2022-02-20 NOTE — Telephone Encounter (Signed)
Ok to switch 

## 2022-02-20 NOTE — Telephone Encounter (Signed)
Informed that change to Dr. Darlyn Read is okay and appointment is scheduled with him on 05/28/22.

## 2022-03-19 ENCOUNTER — Telehealth: Payer: Self-pay | Admitting: Family Medicine

## 2022-03-19 NOTE — Telephone Encounter (Signed)
Pharmacy changed [facility] in [place]

## 2022-04-01 ENCOUNTER — Ambulatory Visit: Payer: Medicare HMO | Admitting: Family

## 2022-04-07 ENCOUNTER — Other Ambulatory Visit: Payer: Self-pay | Admitting: Family

## 2022-04-07 DIAGNOSIS — E559 Vitamin D deficiency, unspecified: Secondary | ICD-10-CM

## 2022-04-07 NOTE — Telephone Encounter (Signed)
Last OV 11/28/21. Last RF 01/07/22. Next OV 05/28/22

## 2022-04-24 ENCOUNTER — Other Ambulatory Visit: Payer: Self-pay | Admitting: Family

## 2022-04-24 DIAGNOSIS — E1142 Type 2 diabetes mellitus with diabetic polyneuropathy: Secondary | ICD-10-CM

## 2022-04-24 DIAGNOSIS — E1165 Type 2 diabetes mellitus with hyperglycemia: Secondary | ICD-10-CM

## 2022-05-28 ENCOUNTER — Encounter: Payer: Self-pay | Admitting: Family Medicine

## 2022-05-28 ENCOUNTER — Ambulatory Visit (INDEPENDENT_AMBULATORY_CARE_PROVIDER_SITE_OTHER): Payer: Medicare HMO | Admitting: Family Medicine

## 2022-05-28 VITALS — BP 110/66 | HR 88 | Temp 97.6°F | Ht 66.0 in | Wt 181.6 lb

## 2022-05-28 DIAGNOSIS — Z23 Encounter for immunization: Secondary | ICD-10-CM | POA: Diagnosis not present

## 2022-05-28 DIAGNOSIS — E1159 Type 2 diabetes mellitus with other circulatory complications: Secondary | ICD-10-CM | POA: Diagnosis not present

## 2022-05-28 DIAGNOSIS — E1142 Type 2 diabetes mellitus with diabetic polyneuropathy: Secondary | ICD-10-CM | POA: Diagnosis not present

## 2022-05-28 DIAGNOSIS — I152 Hypertension secondary to endocrine disorders: Secondary | ICD-10-CM

## 2022-05-28 DIAGNOSIS — E1169 Type 2 diabetes mellitus with other specified complication: Secondary | ICD-10-CM | POA: Diagnosis not present

## 2022-05-28 DIAGNOSIS — E1165 Type 2 diabetes mellitus with hyperglycemia: Secondary | ICD-10-CM

## 2022-05-28 DIAGNOSIS — E785 Hyperlipidemia, unspecified: Secondary | ICD-10-CM

## 2022-05-28 MED ORDER — SITAGLIPTIN PHOSPHATE 50 MG PO TABS: ORAL_TABLET | ORAL | 3 refills | Status: DC

## 2022-05-28 MED ORDER — ATORVASTATIN CALCIUM 40 MG PO TABS: ORAL_TABLET | ORAL | 3 refills | Status: DC

## 2022-05-28 MED ORDER — GLIMEPIRIDE 2 MG PO TABS: 2.0000 mg | ORAL_TABLET | Freq: Every morning | ORAL | 2 refills | Status: DC

## 2022-05-28 NOTE — Progress Notes (Signed)
Subjective:  Patient ID: Barry Smith.,  male    DOB: 01/22/1951  Age: 71 y.o.    CC: Medical Management of Chronic Issues and Establish Care   HPI Barry Johnattan Strassman Sr. presents for  follow-up of hypertension. Patient has no history of headache chest pain or shortness of breath or recent cough. Patient also denies symptoms of TIA such as numbness weakness lateralizing. Patient denies side effects from medication. States taking it regularly.  Patient also  in for follow-up of elevated cholesterol. Doing well without complaints on current medication. Denies side effects  including myalgia and arthralgia and nausea. Also in today for liver function testing. Currently no chest pain, shortness of breath or other cardiovascular related symptoms noted.  Follow-up of diabetes. Patient does not check blood sugar at home. Patient denies symptoms such as excessive hunger or urinary frequency, excessive hunger, nausea No significant hypoglycemic spells noted. Medications reviewed. Pt reports taking them regularly. Pt. denies complication/adverse reaction today.   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   History Barry Smith has a past medical history of Anxiety, Essential hypertension, Hyperlipidemia, and Type 2 diabetes mellitus ([place]).   He has a past surgical history that includes Appendectomy.   His family history includes Cancer in his sister; Diabetes in his brother, brother, brother, brother, brother, father, sister, sister, sister, sister, sister, and sister; Pneumonia in his brother.He reports that he has never smoked. He has never used smokeless tobacco. He reports that he does not drink alcohol and does not use drugs.  Current Outpatient Medications on File Prior to Visit  Medication Sig Dispense Refill   aspirin 81 MG tablet Take 81 mg by mouth daily.     Blood Glucose Calibration (TRUE METRIX LEVEL 1) Low SOLN USE AS DIRECTED WITH GLUCOSE MONITOR 1 each 1`    blood glucose meter kit and supplies Dispense based on patient and insurance preference. Use up to four times daily as directed. (FOR ICD-10 E10.9, E11.9). 1 each 0   Blood Glucose Monitoring Suppl (TRUE METRIX METER) w/Device KIT Test BS four times daily Dx E11.9 1 kit 0   cholecalciferol (VITAMIN D) 1000 UNITS tablet Take 1 tablet (1,000 Units total) by mouth daily. 90 tablet 3   diclofenac (VOLTAREN) 75 MG EC tablet Take 1 tablet (75 mg total) by mouth 2 (two) times daily. 30 tablet 0   gabapentin (NEURONTIN) 300 MG capsule Take 600 mg TID 540 capsule 2   glucose blood (TRUE METRIX BLOOD GLUCOSE TEST) test strip TEST BLOOD SUGAR FOUR TIMES DAILY DX E11.40 400 strip 3   losartan (COZAAR) 100 MG tablet Take 1 tablet (100 mg total) by mouth daily. 90 tablet 4   metFORMIN (GLUCOPHAGE) 500 MG tablet Take 2 tablets (1,000 mg total) by mouth every morning AND 3 tablets (1,500 mg total) at bedtime. 450 tablet 4   Multiple Vitamin (MULTIVITAMIN) capsule Take 1 capsule by mouth daily.     omega-3 acid ethyl esters (LOVAZA) 1 g capsule Take 2 capsules (2 g total) by mouth 2 (two) times daily. 180 capsule 2   Omega-3 Fatty Acids (FISH OIL) 1200 MG CAPS Take 2 capsules by mouth daily.      tamsulosin (FLOMAX) 0.4 MG CAPS capsule Take 1 capsule (0.4 mg total) by mouth daily. 90 capsule 4   TRUEplus Lancets 33G MISC Test BS four times daily Dx E11.9 400 each 3   Vitamin D, Ergocalciferol, (DRISDOL) 1.25 MG (50000  UNIT) CAPS capsule TAKE 1 CAPSULE EVERY WEEK 12 capsule 0   No current facility-administered medications on file prior to visit.    ROS Review of Systems  Objective:  BP 110/66   Pulse 88   Temp 97.6 F (36.4 C)   Ht _0  (1.676 m)   Wt 181 lb 9.6 oz (82.4 kg)   SpO2 98%   BMI 29.31 kg/m   BP Readings from Last 3 Encounters:  05/28/22 110/66  11/28/21 (!) 119/58  10/21/21 132/74    Wt Readings from Last 3 Encounters:  05/28/22 181 lb 9.6 oz (82.4 kg)  11/28/21 181 lb 6.4 oz  (82.3 kg)  10/21/21 176 lb (79.8 kg)     Physical Exam Vitals reviewed.  Constitutional:      Appearance: He is well-developed.  HENT:     Head: Normocephalic and atraumatic.     Right Ear: External ear normal.     Left Ear: External ear normal.     Mouth/Throat:     Pharynx: No oropharyngeal exudate or posterior oropharyngeal erythema.  Eyes:     Pupils: Pupils are equal, round, and reactive to light.  Cardiovascular:     Rate and Rhythm: Normal rate and regular rhythm.     Heart sounds: No murmur heard. Pulmonary:     Effort: No respiratory distress.     Breath sounds: Normal breath sounds.  Musculoskeletal:     Cervical back: Normal range of motion and neck supple.  Neurological:     Mental Status: He is alert and oriented to person, place, and time.     Diabetic Foot Exam - Simple   No data filed     Lab Results  Component Value Date   HGBA1C 6.2 (H) 11/28/2021   HGBA1C 5.7 (H) 06/03/2021   HGBA1C 6.8 11/28/2020    Assessment & Plan:   Barry Smith was seen today for medical management of chronic issues and establish care.  Diagnoses and all orders for this visit:  Hyperlipidemia associated with type 2 diabetes mellitus ([place]) -     Lipid panel -     atorvastatin (LIPITOR) 40 MG tablet; TAKE ONE (1) TABLET EACH DAY  Type 2 diabetes mellitus with hyperglycemia, without long-term current use of insulin ([facility]) -     Bayer DCA Hb A1c Waived -     glimepiride (AMARYL) 2 MG tablet; Take 1 tablet (2 mg total) by mouth every morning. -     sitaGLIPtin (JANUVIA) 50 MG tablet; TAKE ONE (1) TABLET EACH DAY  Hypertension associated with diabetes ([place]) -     CBC with Differential/Platelet -     CMP14+EGFR  Diabetic polyneuropathy associated with type 2 diabetes mellitus ([facility]) -     glimepiride (AMARYL) 2 MG tablet; Take 1 tablet (2 mg total) by mouth every morning. -     sitaGLIPtin (JANUVIA) 50 MG tablet; TAKE ONE (1) TABLET EACH DAY   I have changed Barry Smith  Sr.'s glimepiride. I am also having him maintain his aspirin, multivitamin, Fish Oil, cholecalciferol, blood glucose meter kit and supplies, TRUEplus Lancets 33G, omega-3 acid ethyl esters, True Metrix Meter, True Metrix Blood Glucose Test, True Metrix Level 1, gabapentin, diclofenac, losartan, metFORMIN, tamsulosin, Vitamin D (Ergocalciferol), atorvastatin, and sitaGLIPtin.  Meds ordered this encounter  Medications   atorvastatin (LIPITOR) 40 MG tablet    Sig: TAKE ONE (1) TABLET EACH DAY    Dispense:  90 tablet    Refill:  3   glimepiride (  AMARYL) 2 MG tablet    Sig: Take 1 tablet (2 mg total) by mouth every morning.    Dispense:  90 tablet    Refill:  2   sitaGLIPtin (JANUVIA) 50 MG tablet    Sig: TAKE ONE (1) TABLET EACH DAY    Dispense:  90 tablet    Refill:  3     Follow-up: Return in about 3 months (around 08/28/2022).  Claretta Fraise, M.D.

## 2022-05-29 LAB — CMP14+EGFR
ALT: 15 IU/L (ref 0–44)
AST: 19 IU/L (ref 0–40)
Albumin/Globulin Ratio: 2 (ref 1.2–2.2)
Albumin: 4.7 g/dL (ref 3.8–4.8)
Alkaline Phosphatase: 66 IU/L (ref 44–121)
BUN/Creatinine Ratio: 15 (ref 10–24)
BUN: 13 mg/dL (ref 8–27)
Bilirubin Total: 0.6 mg/dL (ref 0.0–1.2)
CO2: 21 mmol/L (ref 20–29)
Calcium: 9.7 mg/dL (ref 8.6–10.2)
Chloride: 105 mmol/L (ref 96–106)
Creatinine, Ser: 0.88 mg/dL (ref 0.76–1.27)
Globulin, Total: 2.4 g/dL (ref 1.5–4.5)
Glucose: 92 mg/dL (ref 70–99)
Potassium: 4.3 mmol/L (ref 3.5–5.2)
Sodium: 141 mmol/L (ref 134–144)
Total Protein: 7.1 g/dL (ref 6.0–8.5)
eGFR: 92 mL/min/1.73

## 2022-05-29 LAB — LIPID PANEL
Chol/HDL Ratio: 3.4 ratio (ref 0.0–5.0)
Cholesterol, Total: 125 mg/dL (ref 100–199)
HDL: 37 mg/dL — ABNORMAL LOW
LDL Chol Calc (NIH): 70 mg/dL (ref 0–99)
Triglycerides: 93 mg/dL (ref 0–149)
VLDL Cholesterol Cal: 18 mg/dL (ref 5–40)

## 2022-05-29 LAB — CBC WITH DIFFERENTIAL/PLATELET
Basophils Absolute: 0 10*3/uL (ref 0.0–0.2)
Basos: 1 %
EOS (ABSOLUTE): 0.1 10*3/uL (ref 0.0–0.4)
Eos: 3 %
Hematocrit: 39.9 % (ref 37.5–51.0)
Hemoglobin: 13.5 g/dL (ref 13.0–17.7)
Immature Grans (Abs): 0 10*3/uL (ref 0.0–0.1)
Immature Granulocytes: 0 %
Lymphocytes Absolute: 1.8 10*3/uL (ref 0.7–3.1)
Lymphs: 35 %
MCH: 29 pg (ref 26.6–33.0)
MCHC: 33.8 g/dL (ref 31.5–35.7)
MCV: 86 fL (ref 79–97)
Monocytes Absolute: 0.4 10*3/uL (ref 0.1–0.9)
Monocytes: 7 %
Neutrophils Absolute: 2.8 10*3/uL (ref 1.4–7.0)
Neutrophils: 54 %
Platelets: 333 10*3/uL (ref 150–450)
RBC: 4.66 x10E6/uL (ref 4.14–5.80)
RDW: 13.5 % (ref 11.6–15.4)
WBC: 5.1 10*3/uL (ref 3.4–10.8)

## 2022-05-29 LAB — BAYER DCA HB A1C WAIVED: HB A1C (BAYER DCA - WAIVED): 6.7 % — ABNORMAL HIGH (ref 4.8–5.6)

## 2022-05-30 NOTE — Progress Notes (Signed)
Hello Marissa,  Your lab result is normal and/or stable.Some minor variations that are not significant are commonly marked abnormal, but do not represent any medical problem for you.  Best regards, Fritz Cauthon, M.D.

## 2022-06-01 ENCOUNTER — Other Ambulatory Visit: Payer: Self-pay | Admitting: Family

## 2022-06-01 DIAGNOSIS — E1169 Type 2 diabetes mellitus with other specified complication: Secondary | ICD-10-CM

## 2022-06-02 ENCOUNTER — Ambulatory Visit: Payer: Medicare HMO | Admitting: Family

## 2022-06-02 ENCOUNTER — Telehealth: Payer: Self-pay | Admitting: Family Medicine

## 2022-06-02 NOTE — Telephone Encounter (Signed)
Spoke with spouse about medication

## 2022-07-03 ENCOUNTER — Ambulatory Visit (INDEPENDENT_AMBULATORY_CARE_PROVIDER_SITE_OTHER): Payer: Medicare HMO | Admitting: Family Medicine

## 2022-07-03 ENCOUNTER — Encounter: Payer: Self-pay | Admitting: Family Medicine

## 2022-07-03 VITALS — BP 129/71 | HR 78 | Temp 98.6°F | Ht 66.0 in | Wt 182.0 lb

## 2022-07-03 DIAGNOSIS — R5383 Other fatigue: Secondary | ICD-10-CM | POA: Diagnosis not present

## 2022-07-03 DIAGNOSIS — B349 Viral infection, unspecified: Secondary | ICD-10-CM | POA: Diagnosis not present

## 2022-07-03 NOTE — Progress Notes (Signed)
BP 129/71   Pulse 78   Temp 98.6 F (37 C)   Ht 5' 6" (1.676 m)   Wt 182 lb (82.6 kg)   SpO2 97%   BMI 29.38 kg/m    Subjective:   Patient ID: Barry Smith., male    DOB: [birth year], 71 y.o.   MRN: [id number]  HPI: Barry Smith. is a 71 y.o. male presenting on 07/03/2022 for Fatigue (? Covid vs blood sugar elevation)   HPI Decreased energy and dizziness and fatigue Patient says he woke up yesterday with just feeling decreased energy and dizziness and fatigue and just not feeling well.  He tried to go to work and when he got to work he had to come back home because his energy was just down and today he is still feeling the same way.  He says there have been some sick people at work.  He denies any sinus pressure or congestion or cough or sore throat today.  He denies any fevers or chills or shortness of breath or wheezing.  He did feel little bit of dizziness yesterday but not as much today.  He used Mucinex DM and it did help him feel little bit better for a while but has not used anything else at this point  Relevant past medical, surgical, family and social history reviewed and updated as indicated. Interim medical history since our last visit reviewed. Allergies and medications reviewed and updated.  Review of Systems  Constitutional:  Positive for fatigue. Negative for chills and fever.  HENT:  Negative for congestion, ear pain, sinus pressure, sinus pain, sneezing, sore throat and voice change.   Eyes:  Negative for discharge.  Respiratory:  Negative for cough, shortness of breath and wheezing.   Cardiovascular:  Negative for chest pain and leg swelling.  Musculoskeletal:  Negative for back pain and gait problem.  Skin:  Negative for rash.  All other systems reviewed and are negative.   Per HPI unless specifically indicated above   Allergies as of 07/03/2022   No Known Allergies      Medication List        Accurate as of July 03, 2022  3:42 PM.  If you have any questions, ask your nurse or doctor.          aspirin 81 MG tablet Take 81 mg by mouth daily.   atorvastatin 40 MG tablet Commonly known as: LIPITOR TAKE ONE (1) TABLET EACH DAY   blood glucose meter kit and supplies Dispense based on patient and insurance preference. Use up to four times daily as directed. (FOR ICD-10 E10.9, E11.9).   cholecalciferol 1000 units tablet Commonly known as: VITAMIN D Take 1 tablet (1,000 Units total) by mouth daily.   diclofenac 75 MG EC tablet Commonly known as: VOLTAREN Take 1 tablet (75 mg total) by mouth 2 (two) times daily.   Fish Oil 1200 MG Caps Take 2 capsules by mouth daily.   gabapentin 300 MG capsule Commonly known as: NEURONTIN Take 600 mg TID   glimepiride 2 MG tablet Commonly known as: AMARYL Take 1 tablet (2 mg total) by mouth every morning.   losartan 100 MG tablet Commonly known as: COZAAR Take 1 tablet (100 mg total) by mouth daily.   metFORMIN 500 MG tablet Commonly known as: GLUCOPHAGE Take 2 tablets (1,000 mg total) by mouth every morning AND 3 tablets (1,500 mg total) at bedtime.   multivitamin capsule Take 1 capsule by mouth daily.  omega-3 acid ethyl esters 1 g capsule Commonly known as: Lovaza Take 2 capsules (2 g total) by mouth 2 (two) times daily.   sitaGLIPtin 50 MG tablet Commonly known as: Januvia TAKE ONE (1) TABLET EACH DAY   tamsulosin 0.4 MG Caps capsule Commonly known as: FLOMAX Take 1 capsule (0.4 mg total) by mouth daily.   True Metrix Blood Glucose Test test strip Generic drug: glucose blood TEST BLOOD SUGAR FOUR TIMES DAILY DX E11.40   True Metrix Level 1 Low Soln USE AS DIRECTED WITH GLUCOSE MONITOR   True Metrix Meter w/Device Kit Test BS four times daily Dx E11.9   TRUEplus Lancets 33G Misc Test BS four times daily Dx E11.9   Vitamin D (Ergocalciferol) 1.25 MG (50000 UNIT) Caps capsule Commonly known as: DRISDOL TAKE 1 CAPSULE EVERY WEEK          Objective:   BP 129/71   Pulse 78   Temp 98.6 F (37 C)   Ht 5' 6" (1.676 m)   Wt 182 lb (82.6 kg)   SpO2 97%   BMI 29.38 kg/m   Wt Readings from Last 3 Encounters:  07/03/22 182 lb (82.6 kg)  05/28/22 181 lb 9.6 oz (82.4 kg)  11/28/21 181 lb 6.4 oz (82.3 kg)    Physical Exam Vitals and nursing note reviewed.  Constitutional:      General: He is not in acute distress.    Appearance: He is well-developed. He is not diaphoretic.  HENT:     Right Ear: Tympanic membrane and ear canal normal.     Left Ear: Tympanic membrane and ear canal normal.     Mouth/Throat:     Mouth: Mucous membranes are moist.     Pharynx: Oropharynx is clear. No oropharyngeal exudate or posterior oropharyngeal erythema.  Eyes:     General: No scleral icterus.    Conjunctiva/sclera: Conjunctivae normal.  Neck:     Thyroid: No thyromegaly.  Cardiovascular:     Rate and Rhythm: Normal rate and regular rhythm.     Heart sounds: Normal heart sounds. No murmur heard. Pulmonary:     Effort: Pulmonary effort is normal. No respiratory distress.     Breath sounds: Normal breath sounds. No wheezing.  Musculoskeletal:        General: Normal range of motion.     Cervical back: Neck supple.  Lymphadenopathy:     Cervical: No cervical adenopathy.  Skin:    General: Skin is warm and dry.     Findings: No rash.  Neurological:     Mental Status: He is alert and oriented to person, place, and time.     Coordination: Coordination normal.  Psychiatric:        Behavior: Behavior normal.       Assessment & Plan:   Problem List Items Addressed This Visit   None Visit Diagnoses     Other fatigue    -  Primary   Relevant Orders   Novel Coronavirus, NAA ([facility])   CBC with Differential/Platelet   CMP14+EGFR   TSH   Viral illness       Relevant Orders   CBC with Differential/Platelet   CMP14+EGFR   TSH       Likely a viral illness and just has not fully manifested itself.  Will test for  COVID and will do some basic blood work to make sure that nothing else is going on.  If everything is normal then treat with Mucinex and fluids and  conservative management. Follow up plan: Return if symptoms worsen or fail to improve.  Counseling provided for all of the vaccine components Orders Placed This Encounter  Procedures   Novel Coronavirus, NAA ([facility])   CBC with Differential/Platelet   CMP14+EGFR   TSH    Caryl Pina, MD [facility] 07/03/2022, 3:42 PM

## 2022-07-04 LAB — CMP14+EGFR
ALT: 11 IU/L (ref 0–44)
AST: 11 IU/L (ref 0–40)
Albumin/Globulin Ratio: 1.9 (ref 1.2–2.2)
Albumin: 4.2 g/dL (ref 3.8–4.8)
Alkaline Phosphatase: 66 IU/L (ref 44–121)
BUN/Creatinine Ratio: 14 (ref 10–24)
BUN: 14 mg/dL (ref 8–27)
Bilirubin Total: 0.3 mg/dL (ref 0.0–1.2)
CO2: 24 mmol/L (ref 20–29)
Calcium: 10.1 mg/dL (ref 8.6–10.2)
Chloride: 103 mmol/L (ref 96–106)
Creatinine, Ser: 1 mg/dL (ref 0.76–1.27)
Globulin, Total: 2.2 g/dL (ref 1.5–4.5)
Glucose: 109 mg/dL — ABNORMAL HIGH (ref 70–99)
Potassium: 4.2 mmol/L (ref 3.5–5.2)
Sodium: 143 mmol/L (ref 134–144)
Total Protein: 6.4 g/dL (ref 6.0–8.5)
eGFR: 80 mL/min/{1.73_m2} (ref >59)

## 2022-07-04 LAB — CBC WITH DIFFERENTIAL/PLATELET
Basophils Absolute: 0 10*3/uL (ref 0.0–0.2)
Basos: 0 %
EOS (ABSOLUTE): 0.1 10*3/uL (ref 0.0–0.4)
Eos: 1 %
Hematocrit: 37.9 % (ref 37.5–51.0)
Hemoglobin: 12.6 g/dL — ABNORMAL LOW (ref 13.0–17.7)
Immature Grans (Abs): 0 10*3/uL (ref 0.0–0.1)
Immature Granulocytes: 0 %
Lymphocytes Absolute: 1.8 10*3/uL (ref 0.7–3.1)
Lymphs: 18 %
MCH: 28.8 pg (ref 26.6–33.0)
MCHC: 33.2 g/dL (ref 31.5–35.7)
MCV: 87 fL (ref 79–97)
Monocytes Absolute: 0.8 10*3/uL (ref 0.1–0.9)
Monocytes: 8 %
Neutrophils Absolute: 7.2 10*3/uL — ABNORMAL HIGH (ref 1.4–7.0)
Neutrophils: 73 %
Platelets: 291 10*3/uL (ref 150–450)
RBC: 4.37 x10E6/uL (ref 4.14–5.80)
RDW: 12.6 % (ref 11.6–15.4)
WBC: 10 10*3/uL (ref 3.4–10.8)

## 2022-07-04 LAB — TSH: TSH: 1.12 u[IU]/mL (ref 0.450–4.500)

## 2022-07-04 LAB — NOVEL CORONAVIRUS, NAA: SARS-CoV-2, NAA: NOT DETECTED

## 2022-07-08 ENCOUNTER — Telehealth: Payer: Self-pay | Admitting: Family

## 2022-07-08 DIAGNOSIS — E1165 Type 2 diabetes mellitus with hyperglycemia: Secondary | ICD-10-CM

## 2022-07-08 DIAGNOSIS — E1142 Type 2 diabetes mellitus with diabetic polyneuropathy: Secondary | ICD-10-CM

## 2022-07-08 DIAGNOSIS — E1169 Type 2 diabetes mellitus with other specified complication: Secondary | ICD-10-CM

## 2022-07-08 DIAGNOSIS — I152 Hypertension secondary to endocrine disorders: Secondary | ICD-10-CM

## 2022-07-08 MED ORDER — LOSARTAN POTASSIUM 100 MG PO TABS: 100.0000 mg | ORAL_TABLET | Freq: Every day | ORAL | 2 refills | Status: DC

## 2022-07-08 MED ORDER — ATORVASTATIN CALCIUM 40 MG PO TABS: ORAL_TABLET | ORAL | 3 refills | Status: DC

## 2022-07-08 MED ORDER — GLIMEPIRIDE 2 MG PO TABS: 2.0000 mg | ORAL_TABLET | Freq: Every morning | ORAL | 2 refills | Status: DC

## 2022-07-08 NOTE — Telephone Encounter (Signed)
Bryan from [facility] called. Pt no longer using mail order because pt is having issues. Judie Grieve says to cancel rx at mail order and start using local pharmacy.

## 2022-07-08 NOTE — Telephone Encounter (Signed)
Request from [facility] today was for Losartan which was sent to mail order in May, sent remaining refills, as well as recent scripts for Atorvastatin & Glimepiride.

## 2022-07-08 NOTE — Addendum Note (Signed)
Addended by: Julious Payer D on: 07/08/2022 11:37 AM   Modules accepted: Orders

## 2022-07-17 ENCOUNTER — Other Ambulatory Visit: Payer: Self-pay | Admitting: Family Medicine

## 2022-07-17 DIAGNOSIS — E559 Vitamin D deficiency, unspecified: Secondary | ICD-10-CM

## 2022-09-02 ENCOUNTER — Ambulatory Visit (INDEPENDENT_AMBULATORY_CARE_PROVIDER_SITE_OTHER): Payer: Medicare HMO | Admitting: Family Medicine

## 2022-09-02 ENCOUNTER — Encounter: Payer: Self-pay | Admitting: Family Medicine

## 2022-09-02 VITALS — BP 120/72 | HR 77 | Temp 97.8°F | Ht 66.0 in | Wt 174.6 lb

## 2022-09-02 DIAGNOSIS — E1165 Type 2 diabetes mellitus with hyperglycemia: Secondary | ICD-10-CM | POA: Diagnosis not present

## 2022-09-02 DIAGNOSIS — E1159 Type 2 diabetes mellitus with other circulatory complications: Secondary | ICD-10-CM

## 2022-09-02 DIAGNOSIS — I152 Hypertension secondary to endocrine disorders: Secondary | ICD-10-CM

## 2022-09-02 DIAGNOSIS — E785 Hyperlipidemia, unspecified: Secondary | ICD-10-CM

## 2022-09-02 DIAGNOSIS — E1169 Type 2 diabetes mellitus with other specified complication: Secondary | ICD-10-CM | POA: Diagnosis not present

## 2022-09-02 LAB — CBC WITH DIFFERENTIAL/PLATELET
Basophils Absolute: 0 10*3/uL (ref 0.0–0.2)
Basos: 1 %
EOS (ABSOLUTE): 0.1 10*3/uL (ref 0.0–0.4)
Eos: 2 %
Hematocrit: 41.4 % (ref 37.5–51.0)
Hemoglobin: 13.5 g/dL (ref 13.0–17.7)
Immature Grans (Abs): 0 10*3/uL (ref 0.0–0.1)
Immature Granulocytes: 0 %
Lymphocytes Absolute: 1.8 10*3/uL (ref 0.7–3.1)
Lymphs: 37 %
MCH: 27.7 pg (ref 26.6–33.0)
MCHC: 32.6 g/dL (ref 31.5–35.7)
MCV: 85 fL (ref 79–97)
Monocytes Absolute: 0.4 10*3/uL (ref 0.1–0.9)
Monocytes: 8 %
Neutrophils Absolute: 2.6 10*3/uL (ref 1.4–7.0)
Neutrophils: 52 %
Platelets: 273 10*3/uL (ref 150–450)
RBC: 4.88 x10E6/uL (ref 4.14–5.80)
RDW: 13.3 % (ref 11.6–15.4)
WBC: 4.9 10*3/uL (ref 3.4–10.8)

## 2022-09-02 LAB — CMP14+EGFR
ALT: 15 IU/L (ref 0–44)
AST: 14 IU/L (ref 0–40)
Albumin/Globulin Ratio: 2 (ref 1.2–2.2)
Albumin: 4.6 g/dL (ref 3.8–4.8)
Alkaline Phosphatase: 60 IU/L (ref 44–121)
BUN/Creatinine Ratio: 15 (ref 10–24)
BUN: 15 mg/dL (ref 8–27)
Bilirubin Total: 0.5 mg/dL (ref 0.0–1.2)
CO2: 23 mmol/L (ref 20–29)
Calcium: 9.9 mg/dL (ref 8.6–10.2)
Chloride: 103 mmol/L (ref 96–106)
Creatinine, Ser: 0.99 mg/dL (ref 0.76–1.27)
Globulin, Total: 2.3 g/dL (ref 1.5–4.5)
Glucose: 78 mg/dL (ref 70–99)
Potassium: 4.4 mmol/L (ref 3.5–5.2)
Sodium: 144 mmol/L (ref 134–144)
Total Protein: 6.9 g/dL (ref 6.0–8.5)
eGFR: 81 mL/min/{1.73_m2} (ref >59)

## 2022-09-02 LAB — LIPID PANEL
Chol/HDL Ratio: 3.3 ratio (ref 0.0–5.0)
Cholesterol, Total: 130 mg/dL (ref 100–199)
HDL: 40 mg/dL (ref >39)
LDL Chol Calc (NIH): 72 mg/dL (ref 0–99)
Triglycerides: 98 mg/dL (ref 0–149)
VLDL Cholesterol Cal: 18 mg/dL (ref 5–40)

## 2022-09-02 LAB — BAYER DCA HB A1C WAIVED: HB A1C (BAYER DCA - WAIVED): 6.7 % — ABNORMAL HIGH (ref 4.8–5.6)

## 2022-09-02 NOTE — Progress Notes (Signed)
Subjective:  Patient ID: Barry Derry., male    DOB: 09-22-1950  Age: 72 y.o. MRN: [id number]  CC: Medical Management of Chronic Issues, Diabetes, and Hypertension   HPI Barry Drab Sr. presents forFollow-up of diabetes. Patient checks blood sugar at home.   97 fasting and 112.  Patient denies symptoms such as polyuria, polydipsia, excessive hunger, nausea No significant hypoglycemic spells noted. Medications reviewed. Pt reports taking them regularly without complication/adverse reaction being reported today.    History Barry Smith has a past medical history of Anxiety, Essential hypertension, Hyperlipidemia, and Type 2 diabetes mellitus ([place]).   Barry Smith has a past surgical history that includes Appendectomy.   His family history includes Cancer in his sister; Diabetes in his brother, brother, brother, brother, brother, father, sister, sister, sister, sister, sister, and sister; Pneumonia in his brother.Barry Smith reports that Barry Smith has never smoked. Barry Smith has never used smokeless tobacco. Barry Smith reports that Barry Smith does not drink alcohol and does not use drugs.  Current Outpatient Medications on File Prior to Visit  Medication Sig Dispense Refill   aspirin 81 MG tablet Take 81 mg by mouth daily.     atorvastatin (LIPITOR) 40 MG tablet TAKE ONE (1) TABLET EACH DAY 90 tablet 3   Blood Glucose Calibration (TRUE METRIX LEVEL 1) Low SOLN USE AS DIRECTED WITH GLUCOSE MONITOR 1 each 1`   blood glucose meter kit and supplies Dispense based on patient and insurance preference. Use up to four times daily as directed. (FOR ICD-10 E10.9, E11.9). 1 each 0   Blood Glucose Monitoring Suppl (TRUE METRIX METER) w/Device KIT Test BS four times daily Dx E11.9 1 kit 0   cholecalciferol (VITAMIN D) 1000 UNITS tablet Take 1 tablet (1,000 Units total) by mouth daily. 90 tablet 3   diclofenac (VOLTAREN) 75 MG EC tablet Take 1 tablet (75 mg total) by mouth 2 (two) times daily. 30 tablet 0   gabapentin (NEURONTIN) 300 MG  capsule Take 600 mg TID 540 capsule 2   glimepiride (AMARYL) 2 MG tablet Take 1 tablet (2 mg total) by mouth every morning. 90 tablet 2   glucose blood (TRUE METRIX BLOOD GLUCOSE TEST) test strip TEST BLOOD SUGAR FOUR TIMES DAILY DX E11.40 400 strip 3   losartan (COZAAR) 100 MG tablet Take 1 tablet (100 mg total) by mouth daily. 90 tablet 2   metFORMIN (GLUCOPHAGE) 500 MG tablet Take 2 tablets (1,000 mg total) by mouth every morning AND 3 tablets (1,500 mg total) at bedtime. 450 tablet 4   Multiple Vitamin (MULTIVITAMIN) capsule Take 1 capsule by mouth daily.     omega-3 acid ethyl esters (LOVAZA) 1 g capsule Take 2 capsules (2 g total) by mouth 2 (two) times daily. 180 capsule 2   Omega-3 Fatty Acids (FISH OIL) 1200 MG CAPS Take 2 capsules by mouth daily.      sitaGLIPtin (JANUVIA) 50 MG tablet TAKE ONE (1) TABLET EACH DAY 90 tablet 3   tamsulosin (FLOMAX) 0.4 MG CAPS capsule Take 1 capsule (0.4 mg total) by mouth daily. 90 capsule 4   TRUEplus Lancets 33G MISC Test BS four times daily Dx E11.9 400 each 3   Vitamin D, Ergocalciferol, (DRISDOL) 1.25 MG (50000 UNIT) CAPS capsule TAKE 1 CAPSULE EVERY WEEK 12 capsule 0   No current facility-administered medications on file prior to visit.    ROS Review of Systems  Constitutional:  Negative for fever.  Respiratory:  Negative for shortness of breath.   Cardiovascular:  Negative for  chest pain.  Musculoskeletal:  Negative for arthralgias.  Skin:  Negative for rash.    Objective:  BP 120/72   Pulse 77   Temp 97.8 F (36.6 C)   Ht 5\' 6"  (1.676 m)   Wt 174 lb 9.6 oz (79.2 kg)   SpO2 98%   BMI 28.18 kg/m   BP Readings from Last 3 Encounters:  09/02/22 120/72  07/03/22 129/71  05/28/22 110/66    Wt Readings from Last 3 Encounters:  09/02/22 174 lb 9.6 oz (79.2 kg)  07/03/22 182 lb (82.6 kg)  05/28/22 181 lb 9.6 oz (82.4 kg)     Physical Exam Vitals reviewed.  Constitutional:      Appearance: Barry Smith is well-developed.  HENT:      Head: Normocephalic and atraumatic.     Right Ear: External ear normal.     Left Ear: External ear normal.     Mouth/Throat:     Pharynx: No oropharyngeal exudate or posterior oropharyngeal erythema.  Eyes:     Pupils: Pupils are equal, round, and reactive to light.  Cardiovascular:     Rate and Rhythm: Normal rate and regular rhythm.     Heart sounds: No murmur heard. Pulmonary:     Effort: No respiratory distress.     Breath sounds: Normal breath sounds.  Musculoskeletal:     Cervical back: Normal range of motion and neck supple.  Neurological:     Mental Status: Barry Smith is alert and oriented to person, place, and time.       Assessment & Plan:   Barry Smith was seen today for medical management of chronic issues, diabetes and hypertension.  Diagnoses and all orders for this visit:  Hypertension associated with diabetes ([place]) -     CMP14+EGFR -     CBC with Differential/Platelet  Hyperlipidemia associated with type 2 diabetes mellitus ([place]) -     Lipid panel  Type 2 diabetes mellitus with hyperglycemia, without long-term current use of insulin (HCC) -     Bayer DCA Hb A1c Waived      I am having Barry Oto Sr. maintain his aspirin, multivitamin, Fish Oil, cholecalciferol, blood glucose meter kit and supplies, TRUEplus Lancets 33G, omega-3 acid ethyl esters, True Metrix Meter, True Metrix Blood Glucose Test, True Metrix Level 1, gabapentin, diclofenac, metFORMIN, tamsulosin, sitaGLIPtin, losartan, atorvastatin, glimepiride, and Vitamin D (Ergocalciferol).  No orders of the defined types were placed in this encounter.    Follow-up: Return in about 3 months (around 12/01/2022).  Claretta Fraise, M.D.

## 2022-09-03 NOTE — Progress Notes (Signed)
Hello Barry Smith,  Your lab result is normal and/or stable.Some minor variations that are not significant are commonly marked abnormal, but do not represent any medical problem for you.  Best regards, Claretta Fraise, M.D.

## 2022-09-24 ENCOUNTER — Ambulatory Visit (INDEPENDENT_AMBULATORY_CARE_PROVIDER_SITE_OTHER): Payer: Medicare HMO

## 2022-09-24 VITALS — Ht 66.0 in | Wt 174.0 lb

## 2022-09-24 DIAGNOSIS — Z0000 Encounter for general adult medical examination without abnormal findings: Secondary | ICD-10-CM

## 2022-09-24 NOTE — Progress Notes (Signed)
Subjective:   Barry Whitsett Sr. is a 72 y.o. male who presents for Medicare Annual/Subsequent preventive examination.  I connected with  Barry Naden Sr. on 09/24/22 by a audio enabled telemedicine application and verified that I am speaking with the correct person using two identifiers.  Patient Location: Home  Provider Location: Home Office  I discussed the limitations of evaluation and management by telemedicine. The patient expressed understanding and agreed to proceed.  Review of Systems     Cardiac Risk Factors include: advanced age (>11mn, >>23women);diabetes mellitus;dyslipidemia;hypertension;male gender     Objective:    Today's Vitals   09/24/22 1939  Weight: 174 lb (78.9 kg)  Height: '5\' 6"'$  (1.676 m)   Body mass index is 28.08 kg/m.     09/24/2022    7:42 PM 10/20/2021    3:00 PM 05/20/2021    4:13 PM 10/18/2019    9:16 AM 03/02/2018    8:35 AM 09/24/2017    7:40 PM  Advanced Directives  Does Patient Have a Medical Advance Directive? No No No No No No  Would patient like information on creating a medical advance directive? No - Patient declined No - Patient declined No - Patient declined Yes (MAU/Ambulatory/Procedural Areas - Information given) No - Patient declined     Current Medications (verified) Outpatient Encounter Medications as of 09/24/2022  Medication Sig   aspirin 81 MG tablet Take 81 mg by mouth daily.   atorvastatin (LIPITOR) 40 MG tablet TAKE ONE (1) TABLET EACH DAY   Blood Glucose Calibration (TRUE METRIX LEVEL 1) Low SOLN USE AS DIRECTED WITH GLUCOSE MONITOR   blood glucose meter kit and supplies Dispense based on patient and insurance preference. Use up to four times daily as directed. (FOR ICD-10 E10.9, E11.9).   Blood Glucose Monitoring Suppl (TRUE METRIX METER) w/Device KIT Test BS four times daily Dx E11.9   cholecalciferol (VITAMIN D) 1000 UNITS tablet Take 1 tablet (1,000 Units total) by mouth daily.   diclofenac (VOLTAREN) 75 MG  EC tablet Take 1 tablet (75 mg total) by mouth 2 (two) times daily.   gabapentin (NEURONTIN) 300 MG capsule Take 600 mg TID   glimepiride (AMARYL) 2 MG tablet Take 1 tablet (2 mg total) by mouth every morning.   glucose blood (TRUE METRIX BLOOD GLUCOSE TEST) test strip TEST BLOOD SUGAR FOUR TIMES DAILY DX E11.40   losartan (COZAAR) 100 MG tablet Take 1 tablet (100 mg total) by mouth daily.   metFORMIN (GLUCOPHAGE) 500 MG tablet Take 2 tablets (1,000 mg total) by mouth every morning AND 3 tablets (1,500 mg total) at bedtime.   Multiple Vitamin (MULTIVITAMIN) capsule Take 1 capsule by mouth daily.   omega-3 acid ethyl esters (LOVAZA) 1 g capsule Take 2 capsules (2 g total) by mouth 2 (two) times daily.   Omega-3 Fatty Acids (FISH OIL) 1200 MG CAPS Take 2 capsules by mouth daily.    sitaGLIPtin (JANUVIA) 50 MG tablet TAKE ONE (1) TABLET EACH DAY   tamsulosin (FLOMAX) 0.4 MG CAPS capsule Take 1 capsule (0.4 mg total) by mouth daily.   TRUEplus Lancets 33G MISC Test BS four times daily Dx E11.9   Vitamin D, Ergocalciferol, (DRISDOL) 1.25 MG (50000 UNIT) CAPS capsule TAKE 1 CAPSULE EVERY WEEK   No facility-administered encounter medications on file as of 09/24/2022.    Allergies (verified) Patient has no known allergies.   History: Past Medical History:  Diagnosis Date   Anxiety    Essential hypertension  Hyperlipidemia    Type 2 diabetes mellitus (HCC)    Past Surgical History:  Procedure Laterality Date   APPENDECTOMY     Family History  Problem Relation Age of Onset   Diabetes Father    Diabetes Sister    Pneumonia Brother    Diabetes Sister    Diabetes Sister    Diabetes Sister    Diabetes Sister    Diabetes Sister    Cancer Sister    Diabetes Brother    Diabetes Brother    Diabetes Brother    Diabetes Brother    Diabetes Brother    Social History   Socioeconomic History   Marital status: Married    Spouse name: Tye [place]   Number of children: Not on file   Years of  education: Not on file   Highest education level: Not on file  Occupational History   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    Comment: full time  Tobacco Use   Smoking status: Never   Smokeless tobacco: Never  Vaping Use   Vaping Use: Never used  Substance and Sexual Activity   Alcohol use: No   Drug use: No   Sexual activity: Yes  Other Topics Concern   Not on file  Social History Narrative   Lives on one level with his wife - daughter lives in [place], son next door   Social Determinants of Health   Financial Resource Strain: Low Risk  (09/24/2022)   Overall Financial Resource Strain (CARDIA)    Difficulty of Paying Living Expenses: Not hard at all  Food Insecurity: No Food Insecurity (09/24/2022)   Hunger Vital Sign    Worried About Running Out of Food in the Last Year: Never true    [place] in the Last Year: Never true  Transportation Needs: No Transportation Needs (09/24/2022)   PRAPARE - Hydrologist (Medical): No    Lack of Transportation (Non-Medical): No  Physical Activity: Sufficiently Active (09/24/2022)   Exercise Vital Sign    Days of Exercise per Week: 5 days    Minutes of Exercise per Session: 60 min  Stress: No Stress Concern Present (09/24/2022)   [place]    Feeling of Stress : Not at all  Social Connections: Moderately Isolated (09/24/2022)   Social Connection and Isolation Panel [NHANES]    Frequency of Communication with Friends and Family: More than three times a week    Frequency of Social Gatherings with Friends and Family: More than three times a week    Attends Religious Services: Never    Marine scientist or Organizations: No    Attends Music therapist: Never    Marital Status: Married    Tobacco Counseling Counseling given: Not Answered   Clinical Intake:  Pre-visit preparation completed: Yes  Pain : No/denies  pain  Diabetes: Yes CBG done?: No Did pt. bring in CBG monitor from home?: No  How often do you need to have someone help you when you read instructions, pamphlets, or other written materials from your doctor or pharmacy?: 1 - Never  Diabetic?Yes   Nutrition Risk Assessment:  Has the patient had any N/V/D within the last 2 months?  No  Does the patient have any non-healing wounds?  No  Has the patient had any unintentional weight loss or weight gain?  No   Diabetes:  Is the patient diabetic?  Yes  If  diabetic, was a CBG obtained today?  No  Did the patient bring in their glucometer from home?  No  How often do you monitor your CBG's? daily.   Financial Strains and Diabetes Management:  Are you having any financial strains with the device, your supplies or your medication? No .  Does the patient want to be seen by Chronic Care Management for management of their diabetes?  No  Would the patient like to be referred to a Nutritionist or for Diabetic Management?  No   Diabetic Exams:  Diabetic Eye Exam: Overdue for diabetic eye exam. Pt has been advised about the importance in completing this exam. Patient advised to call and schedule an eye exam. Diabetic Foot Exam: Completed 11/28/21   Interpreter Needed?: No  Information entered by :: Denman George LPN   Activities of Daily Living    09/24/2022    7:42 PM  In your present state of health, do you have any difficulty performing the following activities:  Hearing? 0  Vision? 0  Difficulty concentrating or making decisions? 0  Walking or climbing stairs? 0  Dressing or bathing? 0  Doing errands, shopping? 0  Preparing Food and eating ? N  Using the Toilet? N  In the past six months, have you accidently leaked urine? N  Do you have problems with loss of bowel control? N  Managing your Medications? N  Managing your Finances? N  Housekeeping or managing your Housekeeping? N    Patient Care Team: Claretta Fraise, MD as  PCP - General (Family Medicine) Satira Sark, MD as PCP - Cardiology (Cardiology)  Indicate any recent Medical Services you may have received from other than [facility] providers in the past year (date may be approximate).     Assessment:   This is a routine wellness examination for Barry Smith.  Hearing/Vision screen Hearing Screening - Comments:: Denies hearing difficulties  Vision Screening - Comments:: No vision problems; will schedule routine eye exam soon     Dietary issues and exercise activities discussed: Current Exercise Habits: Home exercise routine, Type of exercise: walking, Time (Minutes): 60, Frequency (Times/Week): 5, Weekly Exercise (Minutes/Week): 300, Intensity: Mild   Goals Addressed   None   Depression Screen    09/24/2022    7:41 PM 09/02/2022    8:41 AM 09/02/2022    8:36 AM 07/03/2022    3:26 PM 05/28/2022   10:46 AM 11/28/2021    8:09 AM 10/21/2021    2:00 PM  PHQ 2/9 Scores  PHQ - 2 Score 0 0 0 0 0 0 0  PHQ- 9 Score 0 0         Fall Risk    09/24/2022    7:40 PM 09/02/2022    8:36 AM 07/03/2022    3:26 PM 05/28/2022   10:46 AM 11/28/2021    8:09 AM  [place] in the past year? 0 0 0 0 0  Number falls in past yr: 0      Injury with Fall? 0      Risk for fall due to : No Fall Risks      Follow up Falls prevention discussed    Falls evaluation completed    FALL RISK PREVENTION PERTAINING TO THE HOME:  Any stairs in or around the home?  No If so, are there any without handrails? No  Home free of loose throw rugs in walkways, pet beds, electrical cords, etc? Yes  Adequate lighting in your home  to reduce risk of falls? Yes   ASSISTIVE DEVICES UTILIZED TO PREVENT FALLS:  Life alert? No  Use of a cane, walker or w/c? No  Grab bars in the bathroom? Yes  Shower chair or bench in shower? No  Elevated toilet seat or a handicapped toilet? Yes   TIMED UP AND GO:  Was the test performed? No . Telephonic visit    Cognitive Function:    03/02/2018     8:31 AM  MMSE - Mini Mental State Exam  Orientation to time 5  Orientation to Place 5  Registration 3  Attention/ Calculation 5  Recall 3  Language- name 2 objects 2  Language- repeat 1  Language- follow 3 step command 3  Language- read & follow direction 1  Write a sentence 1  Copy design 1  Total score 30        09/24/2022    7:42 PM 10/18/2019    9:07 AM  6CIT Screen  What Year? 0 points   What month? 0 points   What time? 0 points 0 points  Count back from 20 0 points 2 points  Months in reverse 0 points 0 points  Repeat phrase 0 points 0 points  Total Score 0 points     Immunizations Immunization History  Administered Date(s) Administered   Fluad Quad(high Dose 65+) 03/29/2019, 06/06/2020, 06/03/2021, 05/28/2022   Influenza, High Dose Seasonal PF 05/20/2016, 05/26/2017, 05/19/2018   Influenza,inj,Quad PF,6+ Mos 05/27/2013, 08/10/2014, 05/17/2015   Moderna SARS-COV2 Booster Vaccination 08/15/2020   Moderna Sars-Covid-2 Vaccination 09/27/2019, 10/25/2019   Pneumococcal Conjugate-13 08/10/2014   Pneumococcal Polysaccharide-23 01/22/2017   Td 04/29/2017    TDAP status: Up to date  Flu Vaccine status: Up to date  Pneumococcal vaccine status: Up to date  Covid-19 vaccine status: Information provided on how to obtain vaccines.   Qualifies for Shingles Vaccine? Yes   Zostavax completed No   Shingrix Completed?: No.    Education has been provided regarding the importance of this vaccine. Patient has been advised to call insurance company to determine out of pocket expense if they have not yet received this vaccine. Advised may also receive vaccine at local pharmacy or Health Dept. Verbalized acceptance and understanding.  Screening Tests Health Maintenance  Topic Date Due   OPHTHALMOLOGY EXAM  03/17/2019   COVID-19 Vaccine (3 - Moderna risk series) 09/12/2020   COLONOSCOPY (Pts 45-20yr Insurance coverage will need to be confirmed)  01/31/2021   Zoster Vaccines-  Shingrix (1 of 2) 12/01/2022 (Originally 11/19/1969)   Diabetic kidney evaluation - Urine ACR  11/29/2022   FOOT EXAM  11/29/2022   HEMOGLOBIN A1C  03/03/2023   Diabetic kidney evaluation - eGFR measurement  09/03/2023   Medicare Annual Wellness (AWV)  09/25/2023   DTaP/Tdap/Td (2 - Tdap) 04/30/2027   Pneumonia Vaccine 72 Years old  Completed   INFLUENZA VACCINE  Completed   Hepatitis C Screening  Completed   HPV VACCINES  Aged Out    Health Maintenance  Health Maintenance Due  Topic Date Due   OPHTHALMOLOGY EXAM  03/17/2019   COVID-19 Vaccine (3 - Moderna risk series) 09/12/2020   COLONOSCOPY (Pts 45-472yrInsurance coverage will need to be confirmed)  01/31/2021    Colorectal cancer screening: Type of screening: Colonoscopy. Completed (pt declines repeat at this time). Repeat every 10 years  Lung Cancer Screening: (Low Dose CT Chest recommended if Age 72-80ears, 30 pack-year currently smoking OR have quit w/in 15years.) does not qualify.  Lung Cancer Screening Referral: n/a  Additional Screening:  Hepatitis C Screening: does qualify; Completed 11/13/15  Vision Screening: Recommended annual ophthalmology exams for early detection of glaucoma and other disorders of the eye. Is the patient up to date with their annual eye exam?  No  Who is the provider or what is the name of the office in which the patient attends annual eye exams? none If pt is not established with a provider, would they like to be referred to a provider to establish care? No .   Dental Screening: Recommended annual dental exams for proper oral hygiene  Community Resource Referral / Chronic Care Management: CRR required this visit?  No   CCM required this visit?  No      Plan:     I have personally reviewed and noted the following in the patient's chart:   Medical and social history Use of alcohol, tobacco or illicit drugs  Current medications and supplements including opioid prescriptions.  Patient is not currently taking opioid prescriptions. Functional ability and status Nutritional status Physical activity Advanced directives List of other physicians Hospitalizations, surgeries, and ER visits in previous 12 months Vitals Screenings to include cognitive, depression, and falls Referrals and appointments  In addition, I have reviewed and discussed with patient certain preventive protocols, quality metrics, and best practice recommendations. A written personalized care plan for preventive services as well as general preventive health recommendations were provided to patient.     Vanetta Mulders, [place]   [id number]   Due to this being a virtual visit, the after visit summary with patients personalized plan was offered to patient via mail or my-chart.  Patient would like to access on my-chart  Nurse Notes: No concerns

## 2022-09-24 NOTE — Patient Instructions (Signed)
Barry Smith , Thank you for taking time to come for your Medicare Wellness Visit. I appreciate your ongoing commitment to your health goals. Please review the following plan we discussed and let me know if I can assist you in the future.   These are the goals we discussed:  Goals      DIET - DECREASE SODA OR JUICE INTAKE     Exercise 150 min/wk Moderate Activity        This is a list of the screening recommended for you and due dates:  Health Maintenance  Topic Date Due   Eye exam for diabetics  03/17/2019   COVID-19 Vaccine (3 - Moderna risk series) 09/12/2020   Colon Cancer Screening  01/31/2021   Zoster (Shingles) Vaccine (1 of 2) 12/01/2022*   Yearly kidney health urinalysis for diabetes  11/29/2022   Complete foot exam   11/29/2022   Hemoglobin A1C  03/03/2023   Yearly kidney function blood test for diabetes  09/03/2023   Medicare Annual Wellness Visit  09/25/2023   DTaP/Tdap/Td vaccine (2 - Tdap) 04/30/2027   Pneumonia Vaccine  Completed   Flu Shot  Completed   Hepatitis C Screening: USPSTF Recommendation to screen - Ages 59-79 yo.  Completed   HPV Vaccine  Aged Out  *Topic was postponed. The date shown is not the original due date.    Advanced directives: Forms are available if you choose in the future to pursue completion.  This is recommended in order to make sure that your health wishes are honored in the event that you are unable to verbalize them to the provider.    Conditions/risks identified: Aim for 30 minutes of exercise or brisk walking, 6-8 glasses of water, and 5 servings of fruits and vegetables each day.   Next appointment: Follow up in one year for your annual wellness visit.   Preventive Care 69 Years and Older, Male  Preventive care refers to lifestyle choices and visits with your health care provider that can promote health and wellness. What does preventive care include? A yearly physical exam. This is also called an annual well check. Dental exams  once or twice a year. Routine eye exams. Ask your health care provider how often you should have your eyes checked. Personal lifestyle choices, including: Daily care of your teeth and gums. Regular physical activity. Eating a healthy diet. Avoiding tobacco and drug use. Limiting alcohol use. Practicing safe sex. Taking low doses of aspirin every day. Taking vitamin and mineral supplements as recommended by your health care provider. What happens during an annual well check? The services and screenings done by your health care provider during your annual well check will depend on your age, overall health, lifestyle risk factors, and family history of disease. Counseling  Your health care provider may ask you questions about your: Alcohol use. Tobacco use. Drug use. Emotional well-being. Home and relationship well-being. Sexual activity. Eating habits. History of falls. Memory and ability to understand (cognition). Work and work Statistician. Screening  You may have the following tests or measurements: Height, weight, and BMI. Blood pressure. Lipid and cholesterol levels. These may be checked every 5 years, or more frequently if you are over 29 years old. Skin check. Lung cancer screening. You may have this screening every year starting at age 58 if you have a 30-pack-year history of smoking and currently smoke or have quit within the past 15 years. Fecal occult blood test (FOBT) of the stool. You may have this test  every year starting at age 69. Flexible sigmoidoscopy or colonoscopy. You may have a sigmoidoscopy every 5 years or a colonoscopy every 10 years starting at age 62. Prostate cancer screening. Recommendations will vary depending on your family history and other risks. Hepatitis C blood test. Hepatitis B blood test. Sexually transmitted disease (STD) testing. Diabetes screening. This is done by checking your blood sugar (glucose) after you have not eaten for a while  (fasting). You may have this done every 1-3 years. Abdominal aortic aneurysm (AAA) screening. You may need this if you are a current or former smoker. Osteoporosis. You may be screened starting at age 50 if you are at high risk. Talk with your health care provider about your test results, treatment options, and if necessary, the need for more tests. Vaccines  Your health care provider may recommend certain vaccines, such as: Influenza vaccine. This is recommended every year. Tetanus, diphtheria, and acellular pertussis (Tdap, Td) vaccine. You may need a Td booster every 10 years. Zoster vaccine. You may need this after age 71. Pneumococcal 13-valent conjugate (PCV13) vaccine. One dose is recommended after age 28. Pneumococcal polysaccharide (PPSV23) vaccine. One dose is recommended after age 71. Talk to your health care provider about which screenings and vaccines you need and how often you need them. This information is not intended to replace advice given to you by your health care provider. Make sure you discuss any questions you have with your health care provider. Document Released: 08/10/2015 Document Revised: 04/02/2016 Document Reviewed: 05/15/2015 Elsevier Interactive Patient Education  2017 [place] Prevention in the Home Falls can cause injuries. They can happen to people of all ages. There are many things you can do to make your home safe and to help prevent falls. What can I do on the outside of my home? Regularly fix the edges of walkways and driveways and fix any cracks. Remove anything that might make you trip as you walk through a door, such as a raised step or threshold. Trim any bushes or trees on the path to your home. Use bright outdoor lighting. Clear any walking paths of anything that might make someone trip, such as rocks or tools. Regularly check to see if handrails are loose or broken. Make sure that both sides of any steps have handrails. Any raised  decks and porches should have guardrails on the edges. Have any leaves, snow, or ice cleared regularly. Use sand or salt on walking paths during winter. Clean up any spills in your garage right away. This includes oil or grease spills. What can I do in the bathroom? Use night lights. Install grab bars by the toilet and in the tub and shower. Do not use towel bars as grab bars. Use non-skid mats or decals in the tub or shower. If you need to sit down in the shower, use a plastic, non-slip stool. Keep the floor dry. Clean up any water that spills on the floor as soon as it happens. Remove soap buildup in the tub or shower regularly. Attach bath mats securely with double-sided non-slip rug tape. Do not have throw rugs and other things on the floor that can make you trip. What can I do in the bedroom? Use night lights. Make sure that you have a light by your bed that is easy to reach. Do not use any sheets or blankets that are too big for your bed. They should not hang down onto the floor. Have a firm chair that has  side arms. You can use this for support while you get dressed. Do not have throw rugs and other things on the floor that can make you trip. What can I do in the kitchen? Clean up any spills right away. Avoid walking on wet floors. Keep items that you use a lot in easy-to-reach places. If you need to reach something above you, use a strong step stool that has a grab bar. Keep electrical cords out of the way. Do not use floor polish or wax that makes floors slippery. If you must use wax, use non-skid floor wax. Do not have throw rugs and other things on the floor that can make you trip. What can I do with my stairs? Do not leave any items on the stairs. Make sure that there are handrails on both sides of the stairs and use them. Fix handrails that are broken or loose. Make sure that handrails are as long as the stairways. Check any carpeting to make sure that it is firmly attached  to the stairs. Fix any carpet that is loose or worn. Avoid having throw rugs at the top or bottom of the stairs. If you do have throw rugs, attach them to the floor with carpet tape. Make sure that you have a light switch at the top of the stairs and the bottom of the stairs. If you do not have them, ask someone to add them for you. What else can I do to help prevent falls? Wear shoes that: Do not have high heels. Have rubber bottoms. Are comfortable and fit you well. Are closed at the toe. Do not wear sandals. If you use a stepladder: Make sure that it is fully opened. Do not climb a closed stepladder. Make sure that both sides of the stepladder are locked into place. Ask someone to hold it for you, if possible. Clearly mark and make sure that you can see: Any grab bars or handrails. First and last steps. Where the edge of each step is. Use tools that help you move around (mobility aids) if they are needed. These include: Canes. Walkers. Scooters. Crutches. Turn on the lights when you go into a dark area. Replace any light bulbs as soon as they burn out. Set up your furniture so you have a clear path. Avoid moving your furniture around. If any of your floors are uneven, fix them. If there are any pets around you, be aware of where they are. Review your medicines with your doctor. Some medicines can make you feel dizzy. This can increase your chance of falling. Ask your doctor what other things that you can do to help prevent falls. This information is not intended to replace advice given to you by your health care provider. Make sure you discuss any questions you have with your health care provider. Document Released: 05/10/2009 Document Revised: 12/20/2015 Document Reviewed: 08/18/2014 Elsevier Interactive Patient Education  2017 [employer].

## 2022-10-22 IMAGING — CT CT ANGIO CHEST
2 of 7 series · 19 of 46 positions shown · IV contrast (Omnipaque or Isovue)
Comparison: 10/20/2021, 11/20/2010

CLINICAL DATA: Weakness, diaphoresis, fever, shortness of breath

EXAM:
CT ANGIOGRAPHY CHEST WITH CONTRAST
TECHNIQUE: Multidetector CT imaging of the chest was performed using the
standard protocol during bolus administration of intravenous
contrast. Multiplanar CT image reconstructions and MIPs were
obtained to evaluate the vascular anatomy.

[Series 7: pe axial thins · axial · 0.78mm/px · z∈[+1095,+1359]mm · 16 of 372 slices shown]
[im 21/372  lung]
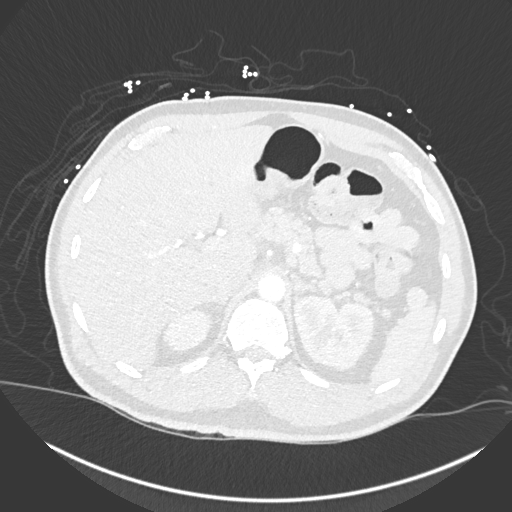
[im 42/372  soft-tissue]
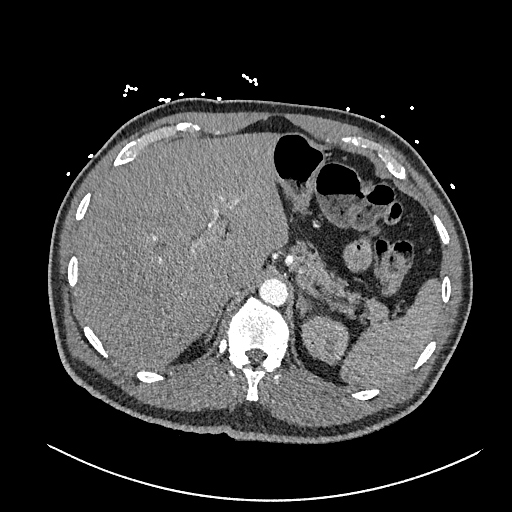
[im 62/372  lung]
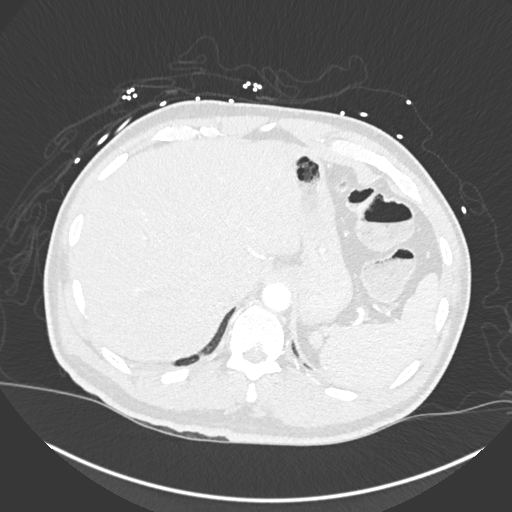
[im 83/372  soft-tissue]
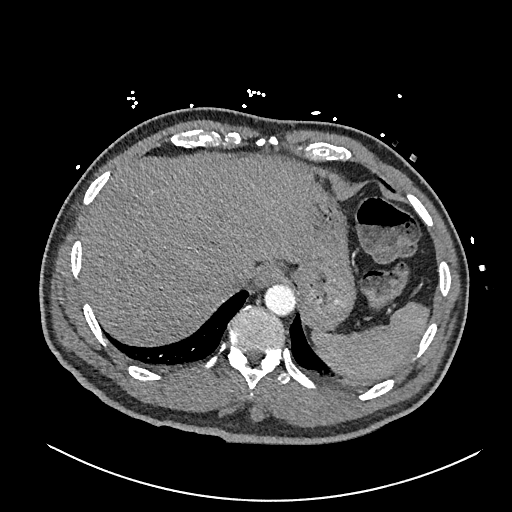
[im 104/372  lung]
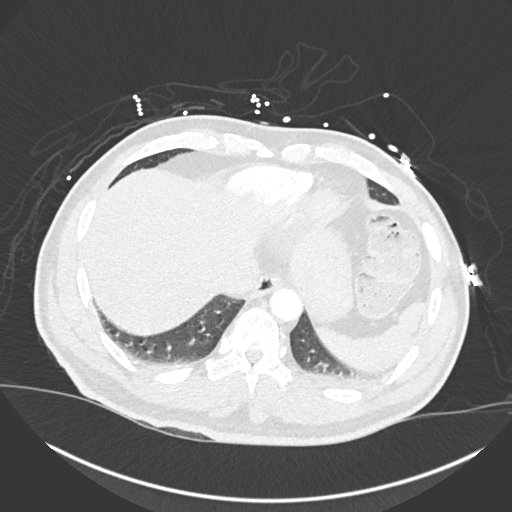
[im 124/372  soft-tissue]
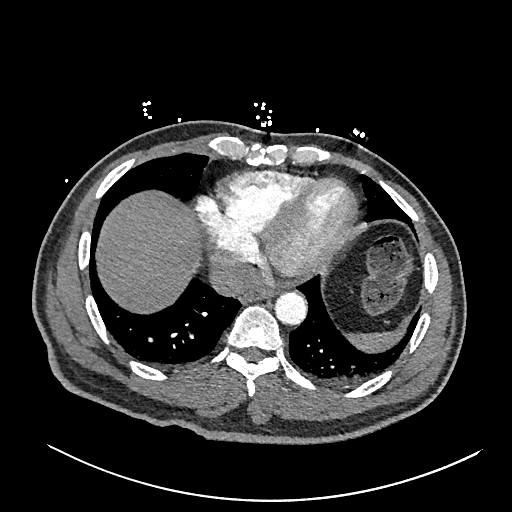
[im 145/372  lung]
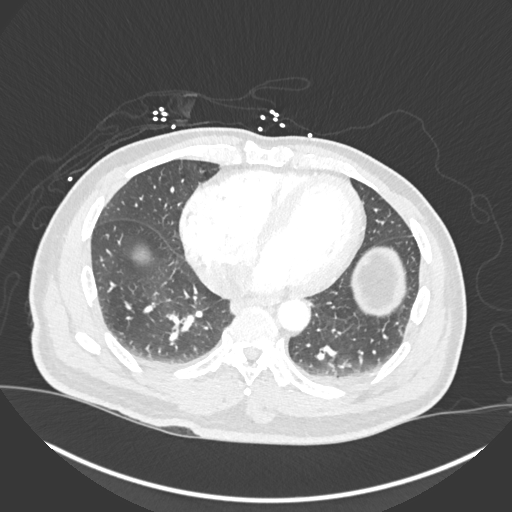
[im 165/372  soft-tissue]
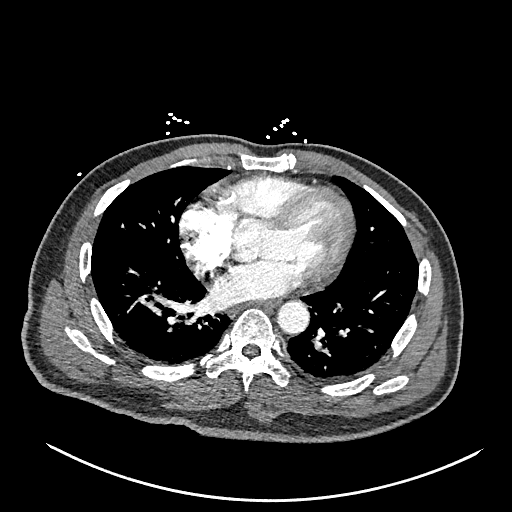
[im 207/372  lung]
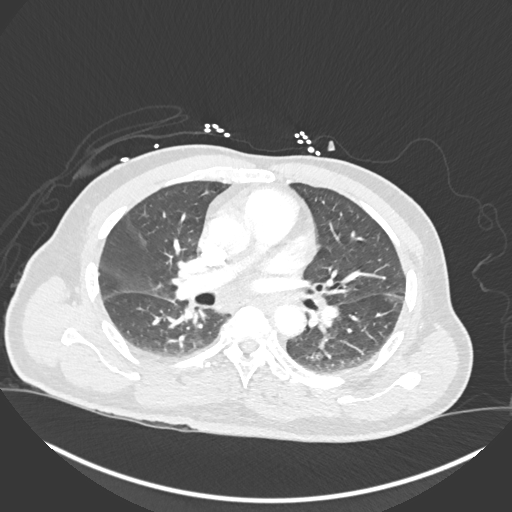
[im 227/372  soft-tissue]
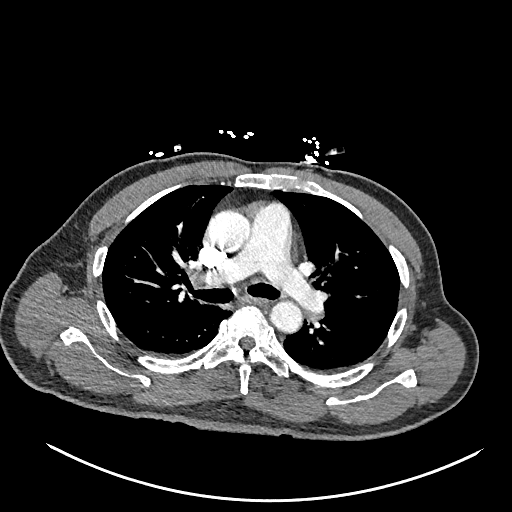
[im 248/372  lung]
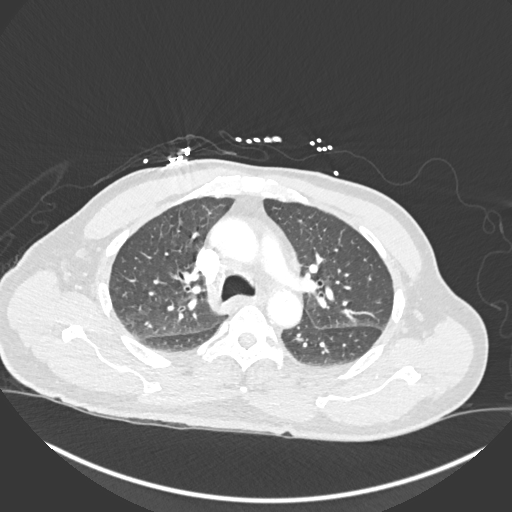
[im 268/372  soft-tissue]
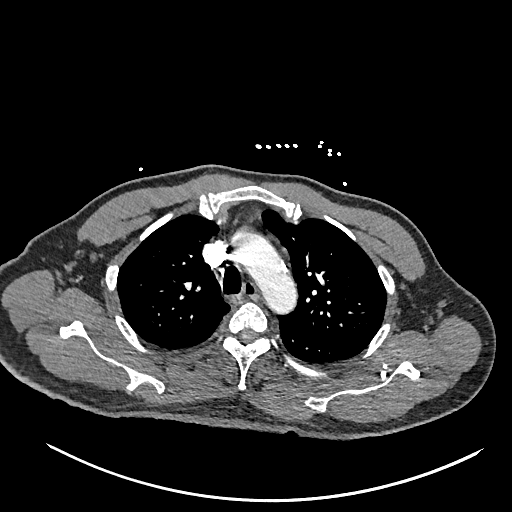
[im 289/372  lung]
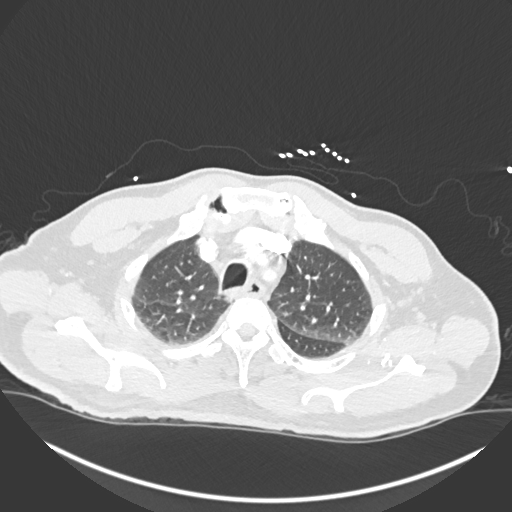
[im 310/372  soft-tissue]
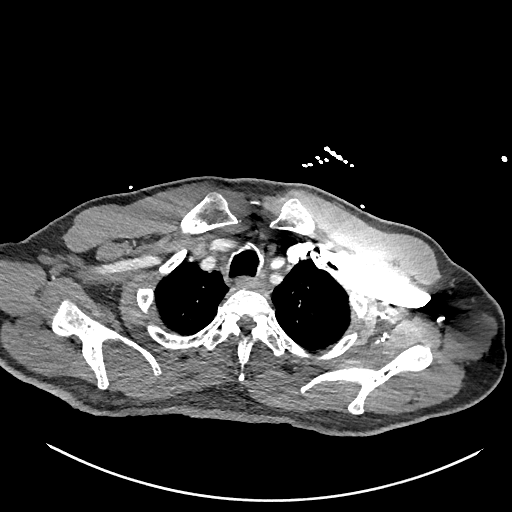
[im 330/372  lung]
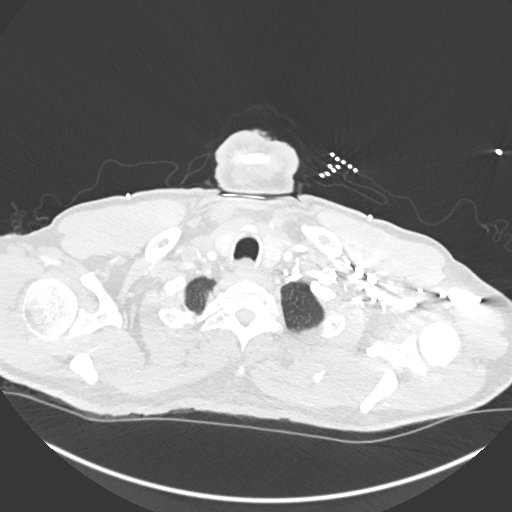
[im 351/372  soft-tissue]
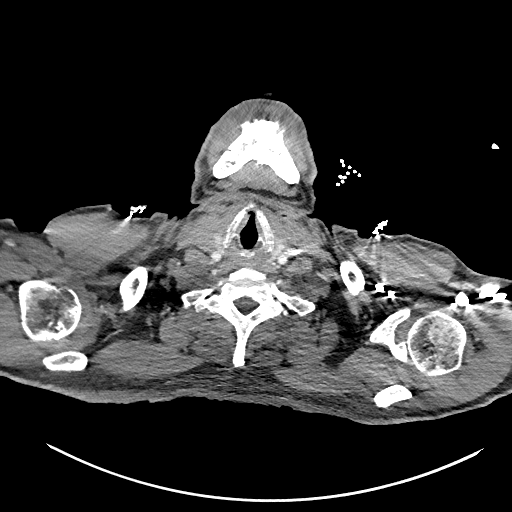

[Series 9: cor soft · coronal · 0.62mm/px · 3 of 142 slices shown]
[im 36/142  soft-tissue]
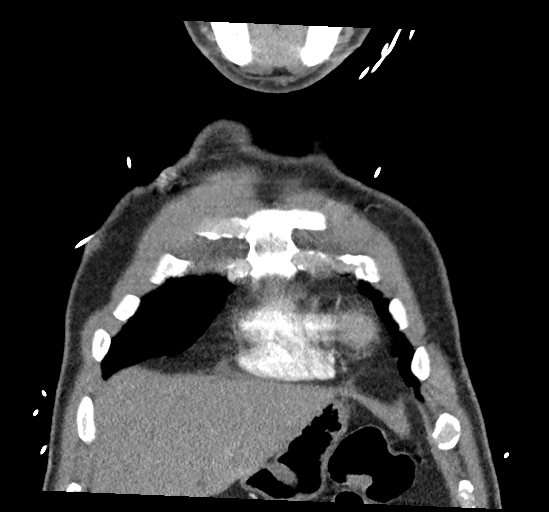
[im 71/142  soft-tissue]
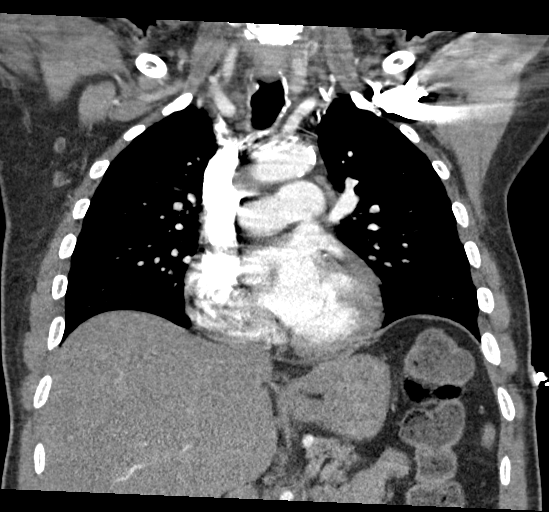
[im 106/142  soft-tissue]
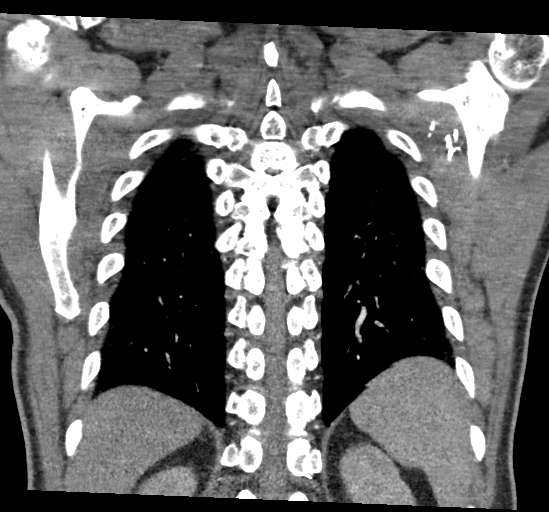

[19 of 46 positions shown; findings below may reference images not displayed]

RADIATION DOSE REDUCTION: This exam was performed according to the
departmental dose-optimization program which includes automated
exposure control, adjustment of the mA and/or kV according to
patient size and/or use of iterative reconstruction technique.

CONTRAST:  75mL OMNIPAQUE IOHEXOL 350 MG/ML SOLN
FINDINGS: Cardiovascular: This is a technically adequate evaluation of the
pulmonary vasculature. No filling defects or pulmonary emboli.

The heart is unremarkable without pericardial effusion. No evidence
of thoracic aortic aneurysm or dissection. Mild atherosclerosis of
the aortic arch and coronary vasculature.

Mediastinum/Nodes: No enlarged mediastinal, hilar, or axillary lymph
nodes. Thyroid gland, trachea, and esophagus demonstrate no
significant findings.

Lungs/Pleura: No acute airspace disease, effusion, or pneumothorax.
Mild dependent hypoventilatory changes within the lower lobes.
Central airways are widely patent.

Upper Abdomen: No acute abnormality.

Musculoskeletal: No acute or destructive bony lesions. Reconstructed
images demonstrate no additional findings.

Review of the MIP images confirms the above findings.
IMPRESSION: 1. No evidence of pulmonary embolus.
2. No acute intrathoracic process.
3. Aortic Atherosclerosis ([id number]). Coronary artery
atherosclerosis.

## 2022-10-22 IMAGING — DX DG CHEST 2V
2 series · 2 of 2 positions shown · non-contrast
Comparison: 09/23/2017

CLINICAL DATA: Weakness for 2 weeks COVID fever

EXAM:
CHEST - 2 VIEW

[chest pa]
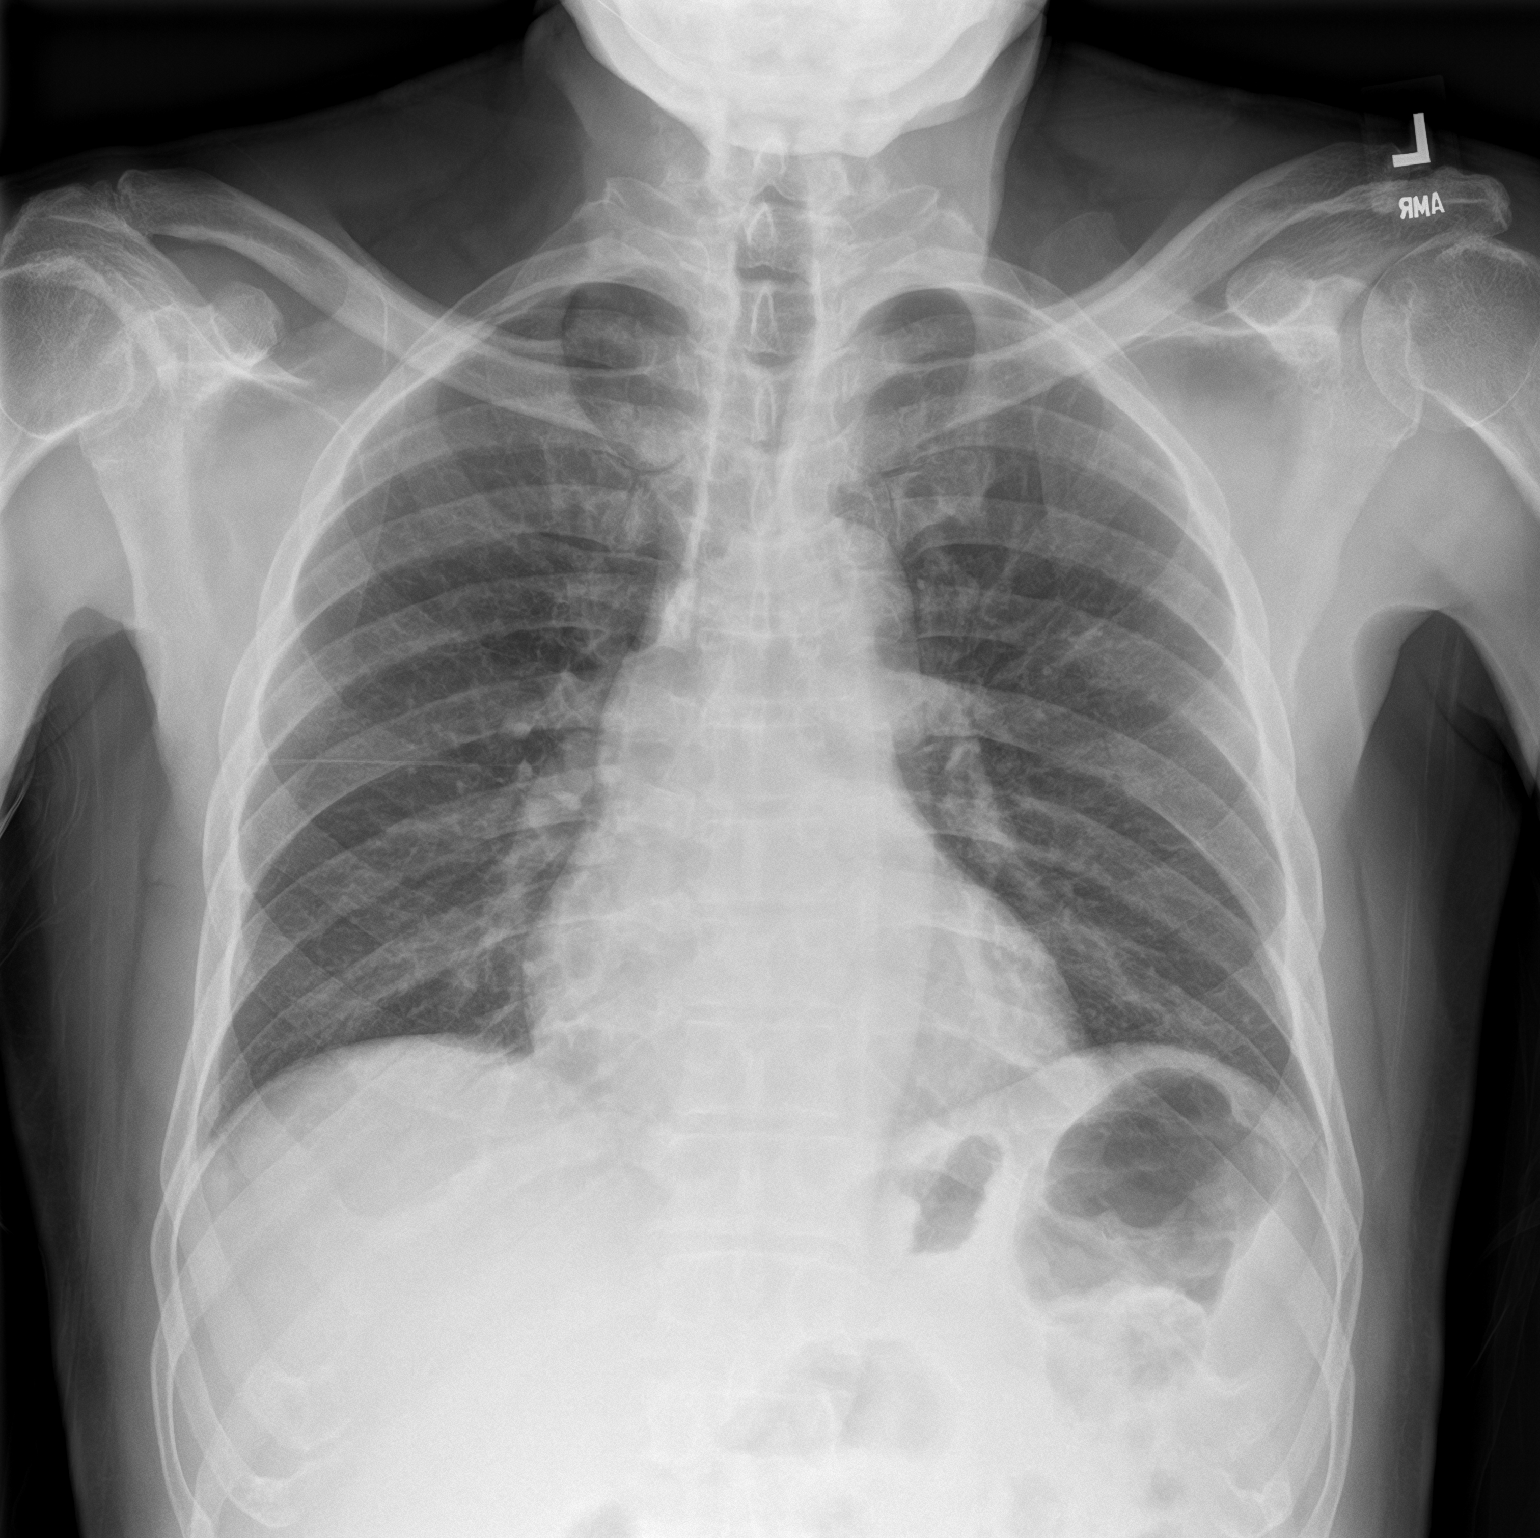

[chest lat]
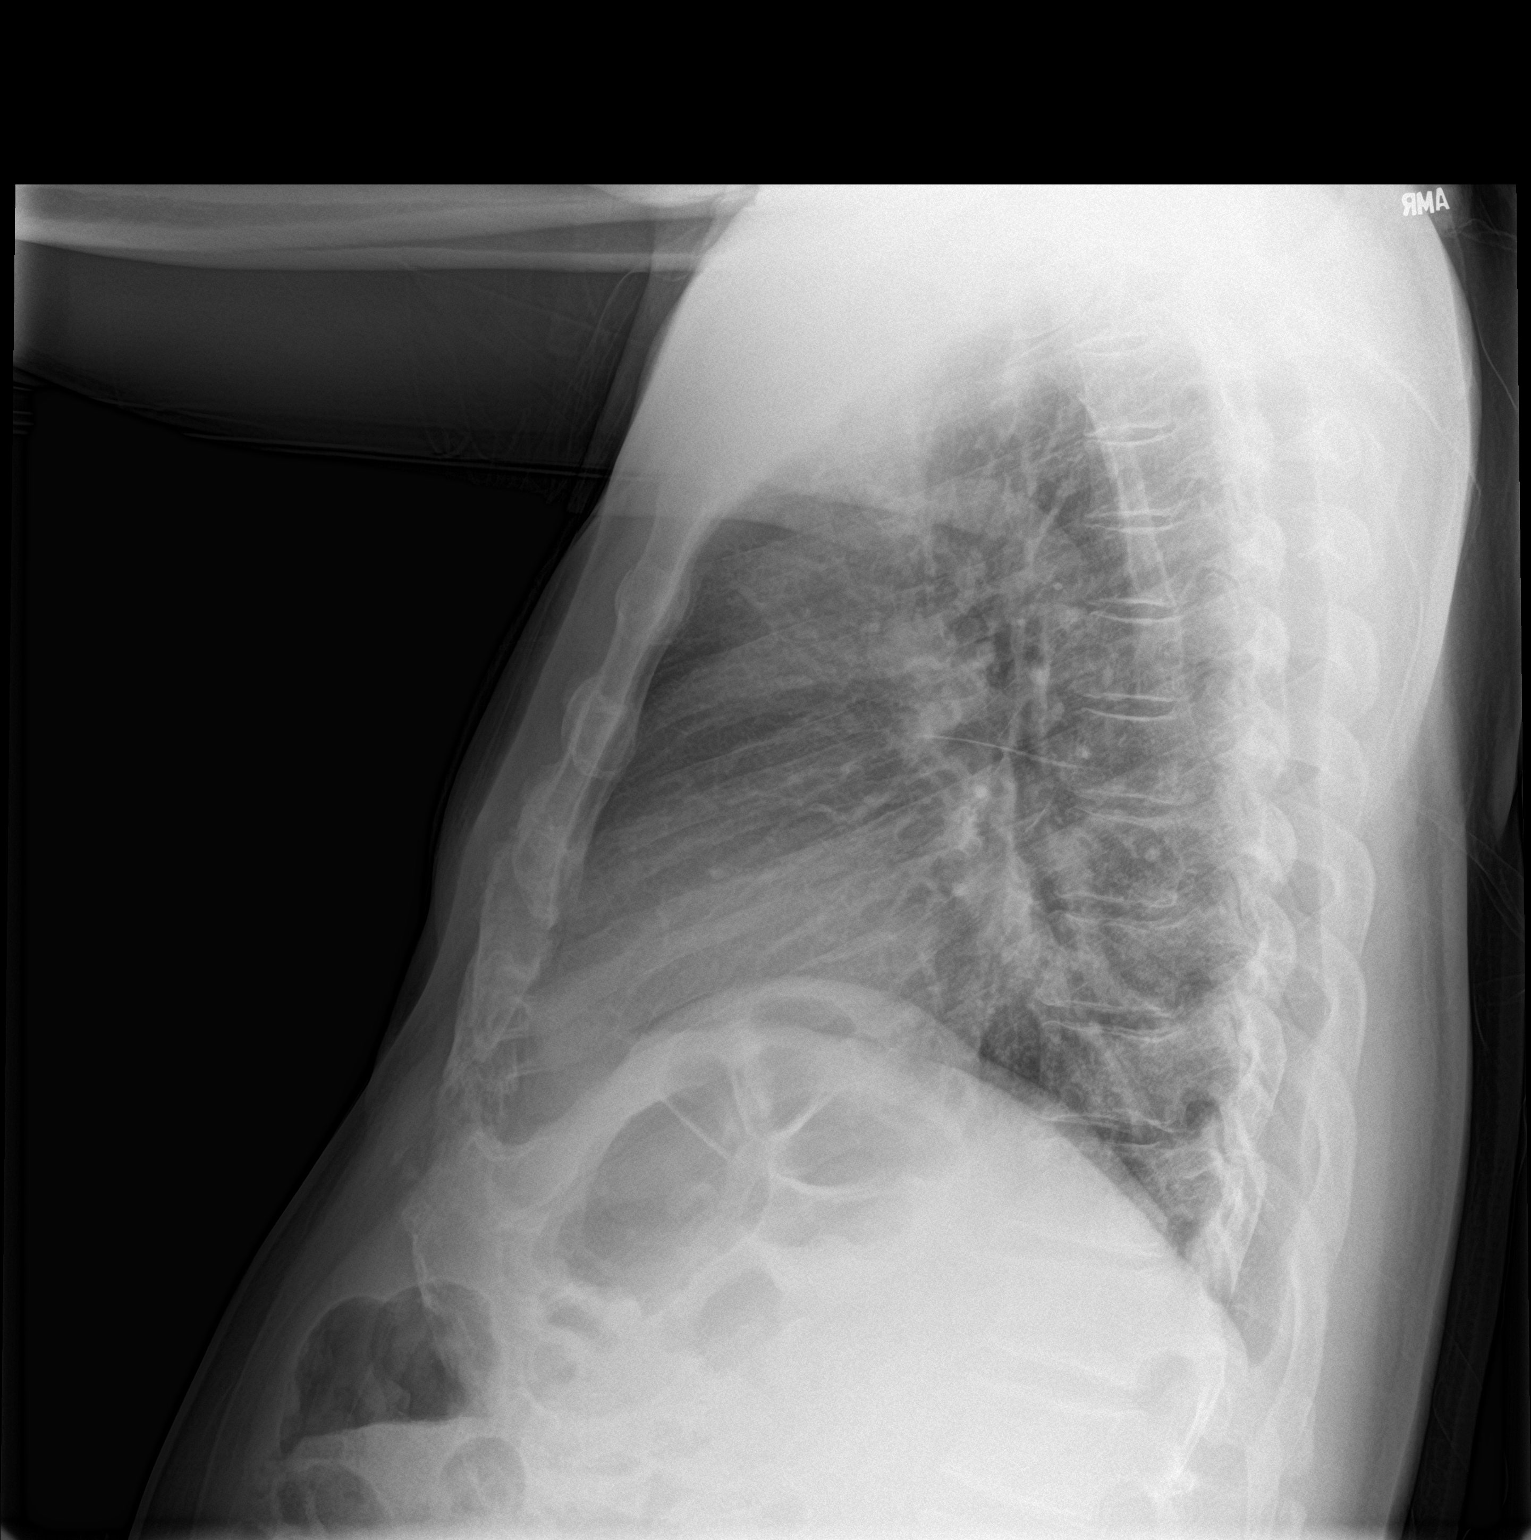

[2 of 2 positions shown; findings below may reference images not displayed]

FINDINGS: The heart size and mediastinal contours are within normal limits.
Both lungs are clear. The visualized skeletal structures are
unremarkable.
IMPRESSION: No active cardiopulmonary disease.

## 2022-10-28 ENCOUNTER — Other Ambulatory Visit: Payer: Self-pay | Admitting: Family

## 2022-10-28 DIAGNOSIS — E1142 Type 2 diabetes mellitus with diabetic polyneuropathy: Secondary | ICD-10-CM

## 2022-10-30 ENCOUNTER — Encounter (HOSPITAL_COMMUNITY): Payer: Self-pay

## 2022-10-30 ENCOUNTER — Other Ambulatory Visit: Payer: Self-pay

## 2022-10-30 ENCOUNTER — Emergency Department (HOSPITAL_COMMUNITY): Payer: Medicare HMO

## 2022-10-30 ENCOUNTER — Emergency Department (HOSPITAL_COMMUNITY)
Admission: EM | Admit: 2022-10-30 | Discharge: 2022-10-30 | Disposition: A | Discharge status: Home / Self Care | Payer: Medicare HMO | Attending: Emergency Medicine | Admitting: Emergency Medicine

## 2022-10-30 DIAGNOSIS — J111 Influenza due to unidentified influenza virus with other respiratory manifestations: Secondary | ICD-10-CM | POA: Diagnosis not present

## 2022-10-30 DIAGNOSIS — R509 Fever, unspecified: Secondary | ICD-10-CM | POA: Insufficient documentation

## 2022-10-30 DIAGNOSIS — Z7982 Long term (current) use of aspirin: Secondary | ICD-10-CM | POA: Diagnosis not present

## 2022-10-30 DIAGNOSIS — Z20822 Contact with and (suspected) exposure to covid-19: Secondary | ICD-10-CM | POA: Insufficient documentation

## 2022-10-30 DIAGNOSIS — E119 Type 2 diabetes mellitus without complications: Secondary | ICD-10-CM | POA: Diagnosis not present

## 2022-10-30 DIAGNOSIS — I10 Essential (primary) hypertension: Secondary | ICD-10-CM | POA: Diagnosis not present

## 2022-10-30 DIAGNOSIS — Z1152 Encounter for screening for COVID-19: Secondary | ICD-10-CM | POA: Diagnosis not present

## 2022-10-30 DIAGNOSIS — J029 Acute pharyngitis, unspecified: Secondary | ICD-10-CM | POA: Diagnosis not present

## 2022-10-30 DIAGNOSIS — Z7984 Long term (current) use of oral hypoglycemic drugs: Secondary | ICD-10-CM | POA: Diagnosis not present

## 2022-10-30 DIAGNOSIS — Z79899 Other long term (current) drug therapy: Secondary | ICD-10-CM | POA: Diagnosis not present

## 2022-10-30 DIAGNOSIS — R059 Cough, unspecified: Secondary | ICD-10-CM | POA: Diagnosis not present

## 2022-10-30 LAB — RESP PANEL BY RT-PCR (RSV, FLU A&B, COVID)  RVPGX2
Influenza A by PCR: NEGATIVE
Influenza B by PCR: NEGATIVE
Resp Syncytial Virus by PCR: NEGATIVE
SARS Coronavirus 2 by RT PCR: NEGATIVE

## 2022-10-30 LAB — GROUP A STREP BY PCR: Group A Strep by PCR: NOT DETECTED

## 2022-10-30 NOTE — Discharge Instructions (Signed)
Chest x-ray negative for pneumonia strep throat negative respiratory panel negative for influenza COVID and RSV.  Recommend symptomatic treatment follow-up with your primary care doctor return for any new or worse symptoms.

## 2022-10-30 NOTE — ED Triage Notes (Signed)
Pt c/o cough, congestion, sore throat and subjective fever x2 days.  Pain score 6/10.  Pt denies taking medication for symptoms this morning.

## 2022-10-30 NOTE — ED Provider Notes (Signed)
[facility] Provider Note   CSN: [id number] Arrival date & time: 10/30/22  [id number]     History  Chief Complaint  Patient presents with   Cough   Fever   Sore Throat    Barry Smith Sr. is a 72 y.o. male.  Patient with flulike symptoms complaint of cough congestion nonproductive cough sore throat subjective fever for 2 days no nausea vomiting or diarrhea.  Throat pain is 6 out of 10.  No known sick exposures.  Past medical history significant for hyperlipidemia type 2 diabetes essential hypertension past surgical history sniffing for appendectomy.  Patient is never used tobacco products.       Home Medications Prior to Admission medications   Medication Sig Start Date End Date Taking? Authorizing Provider  aspirin 81 MG tablet Take 81 mg by mouth daily.    [provider]  atorvastatin (LIPITOR) 40 MG tablet TAKE ONE (1) TABLET EACH DAY 07/08/22   Claretta Fraise, MD  Blood Glucose Calibration (TRUE METRIX LEVEL 1) Low SOLN USE AS DIRECTED WITH GLUCOSE MONITOR 11/04/21   Hawks, Theador Hawthorne, FNP  blood glucose meter kit and supplies Dispense based on patient and insurance preference. Use up to four times daily as directed. (FOR ICD-10 E10.9, E11.9). 03/29/19   Evelina Dun A, FNP  Blood Glucose Monitoring Suppl (TRUE METRIX METER) w/Device KIT Test BS four times daily Dx E11.9 04/25/21   Evelina Dun A, FNP  cholecalciferol (VITAMIN D) 1000 UNITS tablet Take 1 tablet (1,000 Units total) by mouth daily. 08/10/14   Sharion Balloon, FNP  diclofenac (VOLTAREN) 75 MG EC tablet Take 1 tablet (75 mg total) by mouth 2 (two) times daily. 11/28/21   Sharion Balloon, FNP  gabapentin (NEURONTIN) 300 MG capsule TAKE ONE (1) CAPSULE THREE (3) TIMES EACH DAY 10/28/22   Claretta Fraise, MD  glimepiride (AMARYL) 2 MG tablet Take 1 tablet (2 mg total) by mouth every morning. 07/08/22   Claretta Fraise, MD  glucose blood (TRUE METRIX BLOOD GLUCOSE  TEST) test strip TEST BLOOD SUGAR FOUR TIMES DAILY DX E11.40 07/12/21   Evelina Dun A, FNP  losartan (COZAAR) 100 MG tablet Take 1 tablet (100 mg total) by mouth daily. 07/08/22   Claretta Fraise, MD  metFORMIN (GLUCOPHAGE) 500 MG tablet Take 2 tablets (1,000 mg total) by mouth every morning AND 3 tablets (1,500 mg total) at bedtime. 11/28/21   Sharion Balloon, FNP  Multiple Vitamin (MULTIVITAMIN) capsule Take 1 capsule by mouth daily.    [provider]  omega-3 acid ethyl esters (LOVAZA) 1 g capsule Take 2 capsules (2 g total) by mouth 2 (two) times daily. 08/02/20   Sharion Balloon, FNP  Omega-3 Fatty Acids (FISH OIL) 1200 MG CAPS Take 2 capsules by mouth daily.     [provider]  sitaGLIPtin (JANUVIA) 50 MG tablet TAKE ONE (1) TABLET EACH DAY 05/28/22   Claretta Fraise, MD  tamsulosin (FLOMAX) 0.4 MG CAPS capsule Take 1 capsule (0.4 mg total) by mouth daily. 11/28/21   Sharion Balloon, FNP  TRUEplus Lancets 33G MISC Test BS four times daily Dx E11.9 07/02/20   Sharion Balloon, FNP  Vitamin D, Ergocalciferol, (DRISDOL) 1.25 MG (50000 UNIT) CAPS capsule TAKE 1 CAPSULE EVERY WEEK 07/17/22   Claretta Fraise, MD      Allergies    Patient has no known allergies.    Review of Systems   Review of Systems  Constitutional:  Positive for fever. Negative for chills.  HENT:  Positive for congestion and sore throat. Negative for ear pain.   Eyes:  Negative for pain and visual disturbance.  Respiratory:  Positive for cough. Negative for shortness of breath.   Cardiovascular:  Negative for chest pain and palpitations.  Gastrointestinal:  Negative for abdominal pain and vomiting.  Genitourinary:  Negative for dysuria and hematuria.  Musculoskeletal:  Negative for arthralgias and back pain.  Skin:  Negative for color change and rash.  Neurological:  Negative for seizures and syncope.  All other systems reviewed and are negative.   Physical Exam Updated Vital Signs BP 124/72   Pulse  77   Temp 98.1 F (36.7 C) (Oral)   Resp 17   Ht 1.676 m (5\' 6" )   Wt 78.9 kg   SpO2 94%   BMI 28.08 kg/m  Physical Exam Vitals and nursing note reviewed.  Constitutional:      General: He is not in acute distress.    Appearance: Normal appearance. He is well-developed. He is not ill-appearing.  HENT:     Head: Normocephalic and atraumatic.     Mouth/Throat:     Mouth: Mucous membranes are moist.     Pharynx: Oropharynx is clear. No oropharyngeal exudate or posterior oropharyngeal erythema.  Eyes:     Conjunctiva/sclera: Conjunctivae normal.  Cardiovascular:     Rate and Rhythm: Normal rate and regular rhythm.     Heart sounds: No murmur heard. Pulmonary:     Effort: Pulmonary effort is normal. No respiratory distress.     Breath sounds: Normal breath sounds. No wheezing, rhonchi or rales.  Abdominal:     Palpations: Abdomen is soft.     Tenderness: There is no abdominal tenderness.  Musculoskeletal:        General: No swelling.     Cervical back: Normal range of motion and neck supple. No rigidity.  Skin:    General: Skin is warm and dry.     Capillary Refill: Capillary refill takes less than 2 seconds.  Neurological:     General: No focal deficit present.     Mental Status: He is alert and oriented to person, place, and time.  Psychiatric:        Mood and Affect: Mood normal.     ED Results / Procedures / Treatments   Labs (all labs ordered are listed, but only abnormal results are displayed) Labs Reviewed  RESP PANEL BY RT-PCR (RSV, FLU A&B, COVID)  [id number]  GROUP A STREP BY PCR    EKG None  Radiology DG Chest Port 1 View  Result Date: 10/30/2022 CLINICAL DATA:  Cough and congestion for 2 days, initial encounter EXAM: PORTABLE CHEST 1 VIEW COMPARISON:  10/20/2021 FINDINGS: Cardiac shadow is within normal limits. The lungs are well aerated bilaterally. No focal infiltrate or effusion is seen. No bony abnormality is noted. IMPRESSION: No active disease.  Electronically Signed   By: Barry Smith M.D.   On: 10/30/2022 09:27    Procedures Procedures    Medications Ordered in ED Medications - No data to display  ED Course/ Medical Decision Making/ A&P                             Medical Decision Making Amount and/or Complexity of Data Reviewed Radiology: ordered.   Patient with flulike symptoms.  Chest x-ray negative.  Rapid strep negative and respiratory panel negative for COVID flu  and RSV.  Patient's oxygen sats are good at 98% not febrile here.  Nontoxic no acute distress.  Will treat symptomatically have him follow-up with his primary care doctor.   Final Clinical Impression(s) / ED Diagnoses Final diagnoses:  Influenza-like illness    Rx / DC Orders ED Discharge Orders     None         Fredia Sorrow, MD 10/30/22 1112

## 2022-10-30 NOTE — ED Notes (Signed)
Pt states having sore throat and dry cough x 4 days. Pt denies fever.

## 2022-10-30 NOTE — ED Notes (Signed)
Pt is resting at this time, denies pain.

## 2022-11-04 ENCOUNTER — Telehealth: Payer: Self-pay

## 2022-11-04 NOTE — Telephone Encounter (Signed)
     Patient  visit on 10/30/2022  at [facility] was for cough, fever, sore throat.  Have you been able to follow up with your primary care physician? Patient's wife stated it would be a week before he could be seen.  The patient was or was not able to obtain any needed medicine or equipment. Patient was able to obtain over the counter Mucinex.  Are there diet recommendations that you are having difficulty following? No  Patient expresses understanding of discharge instructions and education provided has no other needs at this time. Yes   Tawyna Pellot [facility]  [facility] Community Resource Care Guide   [email or web address]  ?? [phone]   [email or web address]: [email or web address]  [email or web address]

## 2022-12-09 ENCOUNTER — Encounter: Payer: Self-pay | Admitting: Family Medicine

## 2022-12-09 ENCOUNTER — Ambulatory Visit (INDEPENDENT_AMBULATORY_CARE_PROVIDER_SITE_OTHER): Payer: Medicare HMO | Admitting: Family Medicine

## 2022-12-09 VITALS — BP 131/67 | HR 73 | Temp 97.6°F | Ht 66.0 in | Wt 178.4 lb

## 2022-12-09 DIAGNOSIS — Z7984 Long term (current) use of oral hypoglycemic drugs: Secondary | ICD-10-CM

## 2022-12-09 DIAGNOSIS — E1159 Type 2 diabetes mellitus with other circulatory complications: Secondary | ICD-10-CM

## 2022-12-09 DIAGNOSIS — E1165 Type 2 diabetes mellitus with hyperglycemia: Secondary | ICD-10-CM | POA: Diagnosis not present

## 2022-12-09 DIAGNOSIS — I152 Hypertension secondary to endocrine disorders: Secondary | ICD-10-CM | POA: Diagnosis not present

## 2022-12-09 DIAGNOSIS — N401 Enlarged prostate with lower urinary tract symptoms: Secondary | ICD-10-CM

## 2022-12-09 DIAGNOSIS — E785 Hyperlipidemia, unspecified: Secondary | ICD-10-CM | POA: Diagnosis not present

## 2022-12-09 DIAGNOSIS — E1142 Type 2 diabetes mellitus with diabetic polyneuropathy: Secondary | ICD-10-CM

## 2022-12-09 DIAGNOSIS — Z23 Encounter for immunization: Secondary | ICD-10-CM

## 2022-12-09 DIAGNOSIS — E1169 Type 2 diabetes mellitus with other specified complication: Secondary | ICD-10-CM

## 2022-12-09 LAB — CMP14+EGFR
ALT: 16 IU/L (ref 0–44)
AST: 19 IU/L (ref 0–40)
Albumin/Globulin Ratio: 2 (ref 1.2–2.2)
Albumin: 4.5 g/dL (ref 3.8–4.8)
Alkaline Phosphatase: 61 IU/L (ref 44–121)
BUN/Creatinine Ratio: 13 (ref 10–24)
BUN: 11 mg/dL (ref 8–27)
Bilirubin Total: 0.5 mg/dL (ref 0.0–1.2)
CO2: 24 mmol/L (ref 20–29)
Calcium: 9.9 mg/dL (ref 8.6–10.2)
Chloride: 107 mmol/L — ABNORMAL HIGH (ref 96–106)
Creatinine, Ser: 0.83 mg/dL (ref 0.76–1.27)
Globulin, Total: 2.3 g/dL (ref 1.5–4.5)
Glucose: 93 mg/dL (ref 70–99)
Potassium: 4.6 mmol/L (ref 3.5–5.2)
Sodium: 145 mmol/L — ABNORMAL HIGH (ref 134–144)
Total Protein: 6.8 g/dL (ref 6.0–8.5)
eGFR: 93 mL/min/{1.73_m2} (ref >59)

## 2022-12-09 LAB — LIPID PANEL
Chol/HDL Ratio: 3.1 ratio (ref 0.0–5.0)
Cholesterol, Total: 122 mg/dL (ref 100–199)
HDL: 39 mg/dL — ABNORMAL LOW (ref >39)
LDL Chol Calc (NIH): 70 mg/dL (ref 0–99)
Triglycerides: 63 mg/dL (ref 0–149)
VLDL Cholesterol Cal: 13 mg/dL (ref 5–40)

## 2022-12-09 LAB — CBC WITH DIFFERENTIAL/PLATELET
Basophils Absolute: 0 10*3/uL (ref 0.0–0.2)
Basos: 1 %
EOS (ABSOLUTE): 0.2 10*3/uL (ref 0.0–0.4)
Eos: 3 %
Hematocrit: 40.8 % (ref 37.5–51.0)
Hemoglobin: 13.2 g/dL (ref 13.0–17.7)
Immature Grans (Abs): 0 10*3/uL (ref 0.0–0.1)
Immature Granulocytes: 0 %
Lymphocytes Absolute: 1.7 10*3/uL (ref 0.7–3.1)
Lymphs: 36 %
MCH: 27.8 pg (ref 26.6–33.0)
MCHC: 32.4 g/dL (ref 31.5–35.7)
MCV: 86 fL (ref 79–97)
Monocytes Absolute: 0.4 10*3/uL (ref 0.1–0.9)
Monocytes: 8 %
Neutrophils Absolute: 2.4 10*3/uL (ref 1.4–7.0)
Neutrophils: 52 %
Platelets: 340 10*3/uL (ref 150–450)
RBC: 4.74 x10E6/uL (ref 4.14–5.80)
RDW: 13.7 % (ref 11.6–15.4)
WBC: 4.7 10*3/uL (ref 3.4–10.8)

## 2022-12-09 LAB — BAYER DCA HB A1C WAIVED: HB A1C (BAYER DCA - WAIVED): 6.4 % — ABNORMAL HIGH (ref 4.8–5.6)

## 2022-12-09 MED ORDER — METFORMIN HCL 500 MG PO TABS
ORAL_TABLET | ORAL | 4 refills | Status: DC
Start: 2022-12-09 — End: 2023-01-14

## 2022-12-09 MED ORDER — TAMSULOSIN HCL 0.4 MG PO CAPS: 0.4000 mg | ORAL_CAPSULE | Freq: Every day | ORAL | 4 refills | Status: DC

## 2022-12-09 NOTE — Progress Notes (Signed)
Subjective:  Patient ID: Barry Smith., male    DOB: 1951-01-10  Age: 72 y.o. MRN: [id number]  CC: Medical Management of Chronic Issues   HPI Barry Smith Sr. presents forFollow-up of diabetes. Patient denies symptoms such as polyuria, polydipsia, excessive hunger, nausea No significant hypoglycemic spells noted. Medications reviewed. Pt reports taking them regularly without complication/adverse reaction being reported today.   Prostate  doing well.. No frequency or nocturia currently History Barry Smith has a past medical history of Anxiety, Essential hypertension, Hyperlipidemia, and Type 2 diabetes mellitus (HCC).   Barry Smith has a past surgical history that includes Appendectomy.   His family history includes Cancer in his sister; Diabetes in his brother, brother, brother, brother, brother, father, sister, sister, sister, sister, sister, and sister; Pneumonia in his brother.Barry Smith reports that Barry Smith has never smoked. Barry Smith has never used smokeless tobacco. Barry Smith reports that Barry Smith does not drink alcohol and does not use drugs.  Current Outpatient Medications on File Prior to Visit  Medication Sig Dispense Refill   aspirin 81 MG tablet Take 81 mg by mouth daily.     atorvastatin (LIPITOR) 40 MG tablet TAKE ONE (1) TABLET EACH DAY 90 tablet 3   Blood Glucose Calibration (TRUE METRIX LEVEL 1) Low SOLN USE AS DIRECTED WITH GLUCOSE MONITOR 1 each 1`   blood glucose meter kit and supplies Dispense based on patient and insurance preference. Use up to four times daily as directed. (FOR ICD-10 E10.9, E11.9). 1 each 0   Blood Glucose Monitoring Suppl (TRUE METRIX METER) w/Device KIT Test BS four times daily Dx E11.9 1 kit 0   cholecalciferol (VITAMIN D) 1000 UNITS tablet Take 1 tablet (1,000 Units total) by mouth daily. 90 tablet 3   diclofenac (VOLTAREN) 75 MG EC tablet Take 1 tablet (75 mg total) by mouth 2 (two) times daily. 30 tablet 0   gabapentin (NEURONTIN) 300 MG capsule TAKE ONE (1) CAPSULE THREE (3)  TIMES EACH DAY 270 capsule 0   glimepiride (AMARYL) 2 MG tablet Take 1 tablet (2 mg total) by mouth every morning. 90 tablet 2   glucose blood (TRUE METRIX BLOOD GLUCOSE TEST) test strip TEST BLOOD SUGAR FOUR TIMES DAILY DX E11.40 400 strip 3   losartan (COZAAR) 100 MG tablet Take 1 tablet (100 mg total) by mouth daily. 90 tablet 2   Multiple Vitamin (MULTIVITAMIN) capsule Take 1 capsule by mouth daily.     omega-3 acid ethyl esters (LOVAZA) 1 g capsule Take 2 capsules (2 g total) by mouth 2 (two) times daily. 180 capsule 2   Omega-3 Fatty Acids (FISH OIL) 1200 MG CAPS Take 2 capsules by mouth daily.      sitaGLIPtin (JANUVIA) 50 MG tablet TAKE ONE (1) TABLET EACH DAY 90 tablet 3   TRUEplus Lancets 33G MISC Test BS four times daily Dx E11.9 400 each 3   Vitamin D, Ergocalciferol, (DRISDOL) 1.25 MG (50000 UNIT) CAPS capsule TAKE 1 CAPSULE EVERY WEEK 12 capsule 0   No current facility-administered medications on file prior to visit.    ROS Review of Systems  Constitutional:  Negative for fever.  Respiratory:  Negative for shortness of breath.   Cardiovascular:  Negative for chest pain.  Musculoskeletal:  Negative for arthralgias.  Skin:  Negative for rash.    Objective:  BP 131/67   Pulse 73   Temp 97.6 F (36.4 C)   Ht 5\' 6"  (1.676 m)   Wt 178 lb 6.4 oz (80.9 kg)   SpO2  98%   BMI 28.79 kg/m   BP Readings from Last 3 Encounters:  12/09/22 131/67  10/30/22 124/72  09/02/22 120/72    Wt Readings from Last 3 Encounters:  12/09/22 178 lb 6.4 oz (80.9 kg)  10/30/22 174 lb (78.9 kg)  09/24/22 174 lb (78.9 kg)     Physical Exam Vitals reviewed.  Constitutional:      Appearance: Barry Smith is well-developed.  HENT:     Head: Normocephalic and atraumatic.     Right Ear: External ear normal.     Left Ear: External ear normal.     Mouth/Throat:     Pharynx: No oropharyngeal exudate or posterior oropharyngeal erythema.  Eyes:     Pupils: Pupils are equal, round, and reactive to  light.  Cardiovascular:     Rate and Rhythm: Normal rate and regular rhythm.     Heart sounds: No murmur heard. Pulmonary:     Effort: No respiratory distress.     Breath sounds: Normal breath sounds.  Musculoskeletal:     Cervical back: Normal range of motion and neck supple.  Neurological:     Mental Status: Barry Smith is alert and oriented to person, place, and time.       Assessment & Plan:   Barry Smith was seen today for medical management of chronic issues.  Diagnoses and all orders for this visit:  Hypertension associated with diabetes (HCC) -     CBC with Differential/Platelet -     CMP14+EGFR  Hyperlipidemia associated with type 2 diabetes mellitus (HCC) -     Lipid panel  Type 2 diabetes mellitus with hyperglycemia, without long-term current use of insulin (HCC) -     Bayer DCA Hb A1c Waived -     metFORMIN (GLUCOPHAGE) 500 MG tablet; Take 2 tablets (1,000 mg total) by mouth every morning AND 3 tablets (1,500 mg total) at bedtime.  Diabetic polyneuropathy associated with type 2 diabetes mellitus (HCC) -     metFORMIN (GLUCOPHAGE) 500 MG tablet; Take 2 tablets (1,000 mg total) by mouth every morning AND 3 tablets (1,500 mg total) at bedtime. -     Ambulatory referral to Optometry  Benign prostatic hyperplasia with lower urinary tract symptoms, symptom details unspecified -     tamsulosin (FLOMAX) 0.4 MG CAPS capsule; Take 1 capsule (0.4 mg total) by mouth daily.  Need for shingles vaccine -     Zoster Recombinant (Shingrix )      I am having Barry Smith Sr. maintain his aspirin, multivitamin, Fish Oil, cholecalciferol, blood glucose meter kit and supplies, TRUEplus Lancets 33G, omega-3 acid ethyl esters, True Metrix Meter, True Metrix Blood Glucose Test, True Metrix Level 1, diclofenac, sitaGLIPtin, losartan, atorvastatin, glimepiride, Vitamin D (Ergocalciferol), gabapentin, metFORMIN, and tamsulosin.  Meds ordered this encounter  Medications   metFORMIN (GLUCOPHAGE)  500 MG tablet    Sig: Take 2 tablets (1,000 mg total) by mouth every morning AND 3 tablets (1,500 mg total) at bedtime.    Dispense:  450 tablet    Refill:  4   tamsulosin (FLOMAX) 0.4 MG CAPS capsule    Sig: Take 1 capsule (0.4 mg total) by mouth daily.    Dispense:  90 capsule    Refill:  4     Follow-up: Return in about 3 months (around 03/11/2023).  Mechele Claude, M.D.

## 2022-12-17 ENCOUNTER — Other Ambulatory Visit: Payer: Self-pay | Admitting: Family

## 2022-12-17 DIAGNOSIS — N401 Enlarged prostate with lower urinary tract symptoms: Secondary | ICD-10-CM

## 2022-12-26 ENCOUNTER — Other Ambulatory Visit: Payer: Self-pay | Admitting: Family Medicine

## 2022-12-26 DIAGNOSIS — E559 Vitamin D deficiency, unspecified: Secondary | ICD-10-CM

## 2022-12-28 ENCOUNTER — Other Ambulatory Visit: Payer: Self-pay | Admitting: Family Medicine

## 2022-12-28 DIAGNOSIS — I152 Hypertension secondary to endocrine disorders: Secondary | ICD-10-CM

## 2023-01-14 ENCOUNTER — Other Ambulatory Visit: Payer: Self-pay | Admitting: Family

## 2023-01-14 DIAGNOSIS — E1165 Type 2 diabetes mellitus with hyperglycemia: Secondary | ICD-10-CM

## 2023-01-14 DIAGNOSIS — E1142 Type 2 diabetes mellitus with diabetic polyneuropathy: Secondary | ICD-10-CM

## 2023-02-05 ENCOUNTER — Other Ambulatory Visit: Payer: Self-pay | Admitting: Family Medicine

## 2023-02-05 DIAGNOSIS — E1165 Type 2 diabetes mellitus with hyperglycemia: Secondary | ICD-10-CM

## 2023-02-05 DIAGNOSIS — E1142 Type 2 diabetes mellitus with diabetic polyneuropathy: Secondary | ICD-10-CM

## 2023-02-16 ENCOUNTER — Other Ambulatory Visit: Payer: Self-pay | Admitting: Family Medicine

## 2023-02-16 DIAGNOSIS — E1142 Type 2 diabetes mellitus with diabetic polyneuropathy: Secondary | ICD-10-CM

## 2023-03-18 ENCOUNTER — Other Ambulatory Visit: Payer: Self-pay | Admitting: Family Medicine

## 2023-03-18 DIAGNOSIS — I152 Hypertension secondary to endocrine disorders: Secondary | ICD-10-CM

## 2023-03-25 ENCOUNTER — Encounter: Payer: Self-pay | Admitting: Family Medicine

## 2023-03-25 ENCOUNTER — Ambulatory Visit (INDEPENDENT_AMBULATORY_CARE_PROVIDER_SITE_OTHER): Payer: Medicare HMO | Admitting: Family Medicine

## 2023-03-25 VITALS — BP 127/65 | HR 62 | Temp 97.7°F | Ht 66.0 in | Wt 177.2 lb

## 2023-03-25 DIAGNOSIS — E1142 Type 2 diabetes mellitus with diabetic polyneuropathy: Secondary | ICD-10-CM

## 2023-03-25 DIAGNOSIS — E1165 Type 2 diabetes mellitus with hyperglycemia: Secondary | ICD-10-CM

## 2023-03-25 DIAGNOSIS — I152 Hypertension secondary to endocrine disorders: Secondary | ICD-10-CM | POA: Diagnosis not present

## 2023-03-25 DIAGNOSIS — Z7984 Long term (current) use of oral hypoglycemic drugs: Secondary | ICD-10-CM | POA: Diagnosis not present

## 2023-03-25 DIAGNOSIS — E785 Hyperlipidemia, unspecified: Secondary | ICD-10-CM

## 2023-03-25 DIAGNOSIS — E1159 Type 2 diabetes mellitus with other circulatory complications: Secondary | ICD-10-CM

## 2023-03-25 DIAGNOSIS — Z23 Encounter for immunization: Secondary | ICD-10-CM

## 2023-03-25 DIAGNOSIS — E1169 Type 2 diabetes mellitus with other specified complication: Secondary | ICD-10-CM | POA: Diagnosis not present

## 2023-03-25 LAB — BAYER DCA HB A1C WAIVED: HB A1C (BAYER DCA - WAIVED): 6.5 % — ABNORMAL HIGH (ref 4.8–5.6)

## 2023-03-25 MED ORDER — GABAPENTIN 300 MG PO CAPS
ORAL_CAPSULE | ORAL | 3 refills | Status: DC
Start: 2023-03-25 — End: 2024-02-18

## 2023-03-25 MED ORDER — SITAGLIPTIN PHOSPHATE 50 MG PO TABS
ORAL_TABLET | ORAL | 3 refills | Status: DC
Start: 2023-03-25 — End: 2024-04-08

## 2023-03-25 MED ORDER — METFORMIN HCL 500 MG PO TABS
ORAL_TABLET | ORAL | 3 refills | Status: DC
Start: 2023-03-25 — End: 2024-02-01

## 2023-03-25 MED ORDER — GLIMEPIRIDE 2 MG PO TABS
2.0000 mg | ORAL_TABLET | Freq: Every morning | ORAL | 3 refills | Status: DC
Start: 2023-03-25 — End: 2023-04-20

## 2023-03-25 NOTE — Progress Notes (Signed)
Subjective:  Patient ID: Barry Smith., male    DOB: Aug 27, 1950  Age: 72 y.o. MRN: [id number]  CC: Medical Management of Chronic Issues   HPI Barry Shillingburg Sr. presents forFollow-up of diabetes. Patient checks blood sugar at home.   102-110 fasting  Patient denies symptoms such as polyuria, polydipsia, excessive hunger, nausea No significant hypoglycemic spells noted. Medications reviewed. Pt reports taking them regularly without complication/adverse reaction being reported today.    presents for  follow-up of hypertension. Patient has no history of headache chest pain or shortness of breath or recent cough. Patient also denies symptoms of TIA such as focal numbness or weakness. Patient denies side effects from medication. States taking it regularly.   in for follow-up of elevated cholesterol. Doing well without complaints on current medication. Denies side effects of statin including myalgia and arthralgia and nausea. Currently no chest pain, shortness of breath or other cardiovascular related symptoms noted.     History Barry Smith has a past medical history of Anxiety, Essential hypertension, Hyperlipidemia, and Type 2 diabetes mellitus (HCC).   He has a past surgical history that includes Appendectomy.   His family history includes Cancer in his sister; Diabetes in his brother, brother, brother, brother, brother, father, sister, sister, sister, sister, sister, and sister; Pneumonia in his brother.He reports that he has never smoked. He has never used smokeless tobacco. He reports that he does not drink alcohol and does not use drugs.  Current Outpatient Medications on File Prior to Visit  Medication Sig Dispense Refill   aspirin 81 MG tablet Take 81 mg by mouth daily.     atorvastatin (LIPITOR) 40 MG tablet TAKE ONE (1) TABLET EACH DAY 90 tablet 3   cholecalciferol (VITAMIN D) 1000 UNITS tablet Take 1 tablet (1,000 Units total) by mouth daily. 90 tablet 3   losartan  (COZAAR) 100 MG tablet TAKE ONE (1) TABLET BY MOUTH EVERY DAY 90 tablet 0   Multiple Vitamin (MULTIVITAMIN) capsule Take 1 capsule by mouth daily.     Omega-3 Fatty Acids (FISH OIL) 1200 MG CAPS Take 2 capsules by mouth daily.      tamsulosin (FLOMAX) 0.4 MG CAPS capsule TAKE 1 CAPSULE EVERY DAY 90 capsule 3   Vitamin D, Ergocalciferol, (DRISDOL) 1.25 MG (50000 UNIT) CAPS capsule TAKE 1 CAPSULE EVERY WEEK 12 capsule 1   No current facility-administered medications on file prior to visit.    ROS Review of Systems  Constitutional:  Negative for fever.  Respiratory:  Negative for shortness of breath.   Cardiovascular:  Negative for chest pain.  Musculoskeletal:  Negative for arthralgias.  Skin:  Negative for rash.    Objective:  BP 127/65   Pulse 62   Temp 97.7 F (36.5 C)   Ht 5\' 6"  (1.676 m)   Wt 177 lb 3.2 oz (80.4 kg)   SpO2 98%   BMI 28.60 kg/m   BP Readings from Last 3 Encounters:  03/25/23 127/65  12/09/22 131/67  10/30/22 124/72    Wt Readings from Last 3 Encounters:  03/25/23 177 lb 3.2 oz (80.4 kg)  12/09/22 178 lb 6.4 oz (80.9 kg)  10/30/22 174 lb (78.9 kg)     Physical Exam Vitals reviewed.  Constitutional:      Appearance: He is well-developed.  HENT:     Head: Normocephalic and atraumatic.     Right Ear: External ear normal.     Left Ear: External ear normal.     Mouth/Throat:  Pharynx: No oropharyngeal exudate or posterior oropharyngeal erythema.  Eyes:     Pupils: Pupils are equal, round, and reactive to light.  Cardiovascular:     Rate and Rhythm: Normal rate and regular rhythm.     Heart sounds: No murmur heard. Pulmonary:     Effort: No respiratory distress.     Breath sounds: Normal breath sounds.  Musculoskeletal:     Cervical back: Normal range of motion and neck supple.  Neurological:     Mental Status: He is alert and oriented to person, place, and time.       Assessment & Plan:   Barry Smith was seen today for medical management  of chronic issues.  Diagnoses and all orders for this visit:  Hypertension associated with diabetes (HCC) -     CBC with Differential/Platelet -     CMP14+EGFR  Hyperlipidemia associated with type 2 diabetes mellitus (HCC) -     Lipid panel  Type 2 diabetes mellitus with hyperglycemia, without long-term current use of insulin (HCC) -     Bayer DCA Hb A1c Waived -     glimepiride (AMARYL) 2 MG tablet; Take 1 tablet (2 mg total) by mouth every morning. -     metFORMIN (GLUCOPHAGE) 500 MG tablet; TAKE 2 TABLETS EVERY MORNING AND 3 TABLETS AT BEDTIME -     sitaGLIPtin (JANUVIA) 50 MG tablet; TAKE ONE (1) TABLET EACH DAY -     Microalbumin / creatinine urine ratio  Diabetic polyneuropathy associated with type 2 diabetes mellitus (HCC) -     gabapentin (NEURONTIN) 300 MG capsule; TAKE ONE (1) CAPSULE THREE (3) TIMES EACH DAY -     glimepiride (AMARYL) 2 MG tablet; Take 1 tablet (2 mg total) by mouth every morning. -     metFORMIN (GLUCOPHAGE) 500 MG tablet; TAKE 2 TABLETS EVERY MORNING AND 3 TABLETS AT BEDTIME -     sitaGLIPtin (JANUVIA) 50 MG tablet; TAKE ONE (1) TABLET EACH DAY      I have discontinued Barry Leyden Sr.'s blood glucose meter kit and supplies, TRUEplus Lancets 33G, omega-3 acid ethyl esters, True Metrix Meter, True Metrix Blood Glucose Test, True Metrix Level 1, and diclofenac. I have also changed his glimepiride. Additionally, I am having him maintain his aspirin, multivitamin, Fish Oil, cholecalciferol, atorvastatin, tamsulosin, Vitamin D (Ergocalciferol), losartan, gabapentin, metFORMIN, and sitaGLIPtin.  Meds ordered this encounter  Medications   gabapentin (NEURONTIN) 300 MG capsule    Sig: TAKE ONE (1) CAPSULE THREE (3) TIMES EACH DAY    Dispense:  270 capsule    Refill:  3   glimepiride (AMARYL) 2 MG tablet    Sig: Take 1 tablet (2 mg total) by mouth every morning.    Dispense:  90 tablet    Refill:  3   metFORMIN (GLUCOPHAGE) 500 MG tablet    Sig: TAKE 2  TABLETS EVERY MORNING AND 3 TABLETS AT BEDTIME    Dispense:  450 tablet    Refill:  3   sitaGLIPtin (JANUVIA) 50 MG tablet    Sig: TAKE ONE (1) TABLET EACH DAY    Dispense:  90 tablet    Refill:  3     Follow-up: Return in about 3 months (around 06/25/2023).  Mechele Claude, M.D.

## 2023-03-26 LAB — CBC WITH DIFFERENTIAL/PLATELET
Basophils Absolute: 0 10*3/uL (ref 0.0–0.2)
Basos: 1 %
EOS (ABSOLUTE): 0.2 10*3/uL (ref 0.0–0.4)
Eos: 3 %
Hematocrit: 38.8 % (ref 37.5–51.0)
Hemoglobin: 12.7 g/dL — ABNORMAL LOW (ref 13.0–17.7)
Immature Grans (Abs): 0 10*3/uL (ref 0.0–0.1)
Immature Granulocytes: 0 %
Lymphocytes Absolute: 1.7 10*3/uL (ref 0.7–3.1)
Lymphs: 35 %
MCH: 27.9 pg (ref 26.6–33.0)
MCHC: 32.7 g/dL (ref 31.5–35.7)
MCV: 85 fL (ref 79–97)
Monocytes Absolute: 0.3 10*3/uL (ref 0.1–0.9)
Monocytes: 7 %
Neutrophils Absolute: 2.6 10*3/uL (ref 1.4–7.0)
Neutrophils: 54 %
Platelets: 316 10*3/uL (ref 150–450)
RBC: 4.55 x10E6/uL (ref 4.14–5.80)
RDW: 14 % (ref 11.6–15.4)
WBC: 4.9 10*3/uL (ref 3.4–10.8)

## 2023-03-26 LAB — CMP14+EGFR
ALT: 21 IU/L (ref 0–44)
AST: 17 IU/L (ref 0–40)
Albumin: 4.2 g/dL (ref 3.8–4.8)
Alkaline Phosphatase: 59 IU/L (ref 44–121)
BUN/Creatinine Ratio: 11 (ref 10–24)
BUN: 10 mg/dL (ref 8–27)
Bilirubin Total: 0.4 mg/dL (ref 0.0–1.2)
CO2: 25 mmol/L (ref 20–29)
Calcium: 9.4 mg/dL (ref 8.6–10.2)
Chloride: 105 mmol/L (ref 96–106)
Creatinine, Ser: 0.91 mg/dL (ref 0.76–1.27)
Globulin, Total: 2.2 g/dL (ref 1.5–4.5)
Glucose: 126 mg/dL — ABNORMAL HIGH (ref 70–99)
Potassium: 4.3 mmol/L (ref 3.5–5.2)
Sodium: 144 mmol/L (ref 134–144)
Total Protein: 6.4 g/dL (ref 6.0–8.5)
eGFR: 90 mL/min/{1.73_m2} (ref >59)

## 2023-03-26 LAB — LIPID PANEL
Chol/HDL Ratio: 2.9 ratio (ref 0.0–5.0)
Cholesterol, Total: 117 mg/dL (ref 100–199)
HDL: 40 mg/dL (ref >39)
LDL Chol Calc (NIH): 62 mg/dL (ref 0–99)
Triglycerides: 74 mg/dL (ref 0–149)
VLDL Cholesterol Cal: 15 mg/dL (ref 5–40)

## 2023-03-26 LAB — MICROALBUMIN / CREATININE URINE RATIO
Creatinine, Urine: 152.1 mg/dL
Microalb/Creat Ratio: 30 mg/g{creat} — ABNORMAL HIGH (ref 0–29)
Microalbumin, Urine: 45.6 ug/mL

## 2023-03-26 NOTE — Progress Notes (Signed)
Hello [name],  Your lab result is normal and/or stable.Some minor variations that are not significant are commonly marked abnormal, but do not represent any medical problem for you.  Best regards, Claretta Fraise, M.D.

## 2023-03-26 NOTE — Addendum Note (Signed)
Addended by: Diamantina Monks on: 03/26/2023 09:27 AM   Modules accepted: Orders

## 2023-03-28 ENCOUNTER — Other Ambulatory Visit: Payer: Self-pay | Admitting: Family Medicine

## 2023-03-28 DIAGNOSIS — E1142 Type 2 diabetes mellitus with diabetic polyneuropathy: Secondary | ICD-10-CM

## 2023-03-28 DIAGNOSIS — E1165 Type 2 diabetes mellitus with hyperglycemia: Secondary | ICD-10-CM

## 2023-04-02 ENCOUNTER — Other Ambulatory Visit: Payer: Self-pay | Admitting: Family Medicine

## 2023-04-02 DIAGNOSIS — E559 Vitamin D deficiency, unspecified: Secondary | ICD-10-CM

## 2023-04-19 ENCOUNTER — Other Ambulatory Visit: Payer: Self-pay | Admitting: Family Medicine

## 2023-04-19 DIAGNOSIS — E1165 Type 2 diabetes mellitus with hyperglycemia: Secondary | ICD-10-CM

## 2023-04-19 DIAGNOSIS — E1142 Type 2 diabetes mellitus with diabetic polyneuropathy: Secondary | ICD-10-CM

## 2023-05-13 ENCOUNTER — Ambulatory Visit (INDEPENDENT_AMBULATORY_CARE_PROVIDER_SITE_OTHER): Payer: Medicare HMO | Admitting: Family Medicine

## 2023-05-13 ENCOUNTER — Encounter: Payer: Self-pay | Admitting: Family Medicine

## 2023-05-13 VITALS — BP 126/64 | HR 78 | Temp 98.0°F | Ht 66.0 in | Wt 178.0 lb

## 2023-05-13 DIAGNOSIS — J0100 Acute maxillary sinusitis, unspecified: Secondary | ICD-10-CM

## 2023-05-13 DIAGNOSIS — J069 Acute upper respiratory infection, unspecified: Secondary | ICD-10-CM | POA: Diagnosis not present

## 2023-05-13 LAB — VERITOR FLU A/B WAIVED
Influenza A: NEGATIVE
Influenza B: NEGATIVE

## 2023-05-13 MED ORDER — AMOXICILLIN 500 MG PO CAPS
500.0000 mg | ORAL_CAPSULE | Freq: Two times a day (BID) | ORAL | 0 refills | Status: DC
Start: 2023-05-13 — End: 2023-06-30

## 2023-05-13 MED ORDER — FLUTICASONE PROPIONATE 50 MCG/ACT NA SUSP
1.0000 | Freq: Two times a day (BID) | NASAL | 6 refills | Status: DC | PRN
Start: 2023-05-13 — End: 2023-08-13

## 2023-05-13 NOTE — Progress Notes (Signed)
BP 126/64   Pulse 78   Temp 98 F (36.7 C)   Ht 5\' 6"  (1.676 m)   Wt 178 lb (80.7 kg)   SpO2 97%   BMI 28.73 kg/m    Subjective:   Patient ID: Barry Jubilee., male    DOB: 1950/09/13, 72 y.o.   MRN: [id number]  HPI: Barry Carbonell. is a 72 y.o. male presenting on 05/13/2023 for URI   HPI Cough and congestion and fevers Patient is coming in today with cough and congestion and fevers.  He says it started 2 days ago and he especially started with a sore throat and drainage and cough and congestion.  He says he was working outside a lot over the weekend.  He feels like his sore throat is better but the cough and congestion and fevers have been worse, last night he felt quite warm but did not actually take his temperature.  He denies any shortness of breath or wheezing.  Relevant past medical, surgical, family and social history reviewed and updated as indicated. Interim medical history since our last visit reviewed. Allergies and medications reviewed and updated.  Review of Systems  Constitutional:  Positive for chills and fever.  HENT:  Positive for congestion, rhinorrhea, sinus pressure and sore throat. Negative for sneezing.   Eyes:  Negative for visual disturbance.  Respiratory:  Positive for cough. Negative for shortness of breath and wheezing.   Cardiovascular:  Negative for chest pain and leg swelling.  Musculoskeletal:  Negative for myalgias.  Skin:  Negative for rash.  Neurological:  Positive for headaches. Negative for dizziness and light-headedness.  All other systems reviewed and are negative.   Per HPI unless specifically indicated above   Allergies as of 05/13/2023   No Known Allergies      Medication List        Accurate as of May 13, 2023 11:06 AM. If you have any questions, ask your nurse or doctor.          amoxicillin 500 MG capsule Commonly known as: AMOXIL Take 1 capsule (500 mg total) by mouth 2 (two) times daily. Started  by: Elige Radon Shankar Silber   aspirin 81 MG tablet Take 81 mg by mouth daily.   atorvastatin 40 MG tablet Commonly known as: LIPITOR TAKE ONE (1) TABLET EACH DAY   cholecalciferol 1000 units tablet Commonly known as: VITAMIN D Take 1 tablet (1,000 Units total) by mouth daily.   Fish Oil 1200 MG Caps Take 2 capsules by mouth daily.   fluticasone 50 MCG/ACT nasal spray Commonly known as: FLONASE Place 1 spray into both nostrils 2 (two) times daily as needed for allergies or rhinitis. Started by: Elige Radon Barry Smith   gabapentin 300 MG capsule Commonly known as: NEURONTIN TAKE ONE (1) CAPSULE THREE (3) TIMES EACH DAY   glimepiride 2 MG tablet Commonly known as: AMARYL TAKE 1 TABLET EVERY MORNING   losartan 100 MG tablet Commonly known as: COZAAR TAKE ONE (1) TABLET BY MOUTH EVERY DAY   metFORMIN 500 MG tablet Commonly known as: GLUCOPHAGE TAKE 2 TABLETS EVERY MORNING AND 3 TABLETS AT BEDTIME   multivitamin capsule Take 1 capsule by mouth daily.   sitaGLIPtin 50 MG tablet Commonly known as: Januvia TAKE ONE (1) TABLET EACH DAY   tamsulosin 0.4 MG Caps capsule Commonly known as: FLOMAX TAKE 1 CAPSULE EVERY DAY   Vitamin D (Ergocalciferol) 1.25 MG (50000 UNIT) Caps capsule Commonly known as: DRISDOL TAKE 1 CAPSULE  EVERY WEEK         Objective:   BP 126/64   Pulse 78   Temp 98 F (36.7 C)   Ht 5\' 6"  (1.676 m)   Wt 178 lb (80.7 kg)   SpO2 97%   BMI 28.73 kg/m   Wt Readings from Last 3 Encounters:  05/13/23 178 lb (80.7 kg)  03/25/23 177 lb 3.2 oz (80.4 kg)  12/09/22 178 lb 6.4 oz (80.9 kg)    Physical Exam Vitals and nursing note reviewed.  Constitutional:      General: He is not in acute distress.    Appearance: He is well-developed. He is not diaphoretic.  HENT:     Right Ear: Tympanic membrane and ear canal normal.     Left Ear: Tympanic membrane and ear canal normal.     Nose:     Right Sinus: Maxillary sinus tenderness present.     Left Sinus:  Maxillary sinus tenderness present.     Mouth/Throat:     Mouth: Mucous membranes are moist.     Pharynx: Oropharynx is clear. No oropharyngeal exudate or posterior oropharyngeal erythema.  Eyes:     General: No scleral icterus.    Conjunctiva/sclera: Conjunctivae normal.  Neck:     Thyroid: No thyromegaly.  Cardiovascular:     Rate and Rhythm: Normal rate and regular rhythm.     Heart sounds: Normal heart sounds. No murmur heard. Pulmonary:     Effort: Pulmonary effort is normal. No respiratory distress.     Breath sounds: Normal breath sounds. No wheezing.  Musculoskeletal:        General: Normal range of motion.     Cervical back: Neck supple.  Lymphadenopathy:     Cervical: No cervical adenopathy.  Skin:    General: Skin is warm and dry.     Findings: No rash.  Neurological:     Mental Status: He is alert and oriented to person, place, and time.     Coordination: Coordination normal.  Psychiatric:        Behavior: Behavior normal.       Assessment & Plan:   Problem List Items Addressed This Visit   None Visit Diagnoses     URI, acute    -  Primary   Relevant Medications   amoxicillin (AMOXIL) 500 MG capsule   fluticasone (FLONASE) 50 MCG/ACT nasal spray   Other Relevant Orders   Veritor Flu A/B Waived   Novel Coronavirus, NAA ([facility])   Acute non-recurrent maxillary sinusitis       Relevant Medications   amoxicillin (AMOXIL) 500 MG capsule   fluticasone (FLONASE) 50 MCG/ACT nasal spray       Likely sinus infection, will await COVID testing the flu is negative.  Sent amoxicillin for him.  Continue Mucinex and also sent Flonase and recommended to continue salt water gargles. Follow up plan: Return if symptoms worsen or fail to improve.  Counseling provided for all of the vaccine components Orders Placed This Encounter  Procedures   Novel Coronavirus, NAA ([facility])   Veritor Flu A/B Waived    Arville Care, MD [facility] 05/13/2023, 11:06 AM

## 2023-05-14 LAB — NOVEL CORONAVIRUS, NAA: SARS-CoV-2, NAA: NOT DETECTED

## 2023-05-21 ENCOUNTER — Other Ambulatory Visit: Payer: Self-pay | Admitting: Family Medicine

## 2023-05-21 DIAGNOSIS — E785 Hyperlipidemia, unspecified: Secondary | ICD-10-CM

## 2023-05-28 ENCOUNTER — Telehealth: Payer: Self-pay | Admitting: Family Medicine

## 2023-05-28 NOTE — Telephone Encounter (Signed)
Pt declined

## 2023-06-18 ENCOUNTER — Other Ambulatory Visit: Payer: Self-pay | Admitting: Family Medicine

## 2023-06-18 DIAGNOSIS — E1159 Type 2 diabetes mellitus with other circulatory complications: Secondary | ICD-10-CM

## 2023-06-22 ENCOUNTER — Telehealth: Payer: Self-pay

## 2023-06-22 NOTE — Patient Outreach (Signed)
Attempted to contact patient regarding care gaps. Left voicemail for patient to return my call at [phone].  Nicholes Rough, CMA Care Guide [facility] [employer]

## 2023-06-30 ENCOUNTER — Ambulatory Visit (INDEPENDENT_AMBULATORY_CARE_PROVIDER_SITE_OTHER): Payer: Medicare HMO | Admitting: Family Medicine

## 2023-06-30 ENCOUNTER — Encounter: Payer: Self-pay | Admitting: Family Medicine

## 2023-06-30 VITALS — BP 127/63 | HR 73 | Temp 97.5°F | Ht 66.0 in | Wt 174.8 lb

## 2023-06-30 DIAGNOSIS — E1169 Type 2 diabetes mellitus with other specified complication: Secondary | ICD-10-CM | POA: Diagnosis not present

## 2023-06-30 DIAGNOSIS — E1165 Type 2 diabetes mellitus with hyperglycemia: Secondary | ICD-10-CM | POA: Diagnosis not present

## 2023-06-30 DIAGNOSIS — E1159 Type 2 diabetes mellitus with other circulatory complications: Secondary | ICD-10-CM | POA: Diagnosis not present

## 2023-06-30 DIAGNOSIS — Z7984 Long term (current) use of oral hypoglycemic drugs: Secondary | ICD-10-CM

## 2023-06-30 DIAGNOSIS — E1142 Type 2 diabetes mellitus with diabetic polyneuropathy: Secondary | ICD-10-CM | POA: Diagnosis not present

## 2023-06-30 DIAGNOSIS — I152 Hypertension secondary to endocrine disorders: Secondary | ICD-10-CM | POA: Diagnosis not present

## 2023-06-30 DIAGNOSIS — E785 Hyperlipidemia, unspecified: Secondary | ICD-10-CM | POA: Diagnosis not present

## 2023-06-30 DIAGNOSIS — Z23 Encounter for immunization: Secondary | ICD-10-CM

## 2023-06-30 LAB — CBC WITH DIFFERENTIAL/PLATELET
Basophils Absolute: 0 10*3/uL (ref 0.0–0.2)
Basos: 1 %
EOS (ABSOLUTE): 0.1 10*3/uL (ref 0.0–0.4)
Eos: 2 %
Hematocrit: 42 % (ref 37.5–51.0)
Hemoglobin: 12.9 g/dL — ABNORMAL LOW (ref 13.0–17.7)
Immature Grans (Abs): 0 10*3/uL (ref 0.0–0.1)
Immature Granulocytes: 0 %
Lymphocytes Absolute: 1.8 10*3/uL (ref 0.7–3.1)
Lymphs: 31 %
MCH: 27.5 pg (ref 26.6–33.0)
MCHC: 30.7 g/dL — ABNORMAL LOW (ref 31.5–35.7)
MCV: 90 fL (ref 79–97)
Monocytes Absolute: 0.3 10*3/uL (ref 0.1–0.9)
Monocytes: 6 %
Neutrophils Absolute: 3.5 10*3/uL (ref 1.4–7.0)
Neutrophils: 60 %
Platelets: 331 10*3/uL (ref 150–450)
RBC: 4.69 x10E6/uL (ref 4.14–5.80)
RDW: 13.7 % (ref 11.6–15.4)
WBC: 5.7 10*3/uL (ref 3.4–10.8)

## 2023-06-30 LAB — CMP14+EGFR
ALT: 12 [IU]/L (ref 0–44)
AST: 13 [IU]/L (ref 0–40)
Albumin: 4.4 g/dL (ref 3.8–4.8)
Alkaline Phosphatase: 62 [IU]/L (ref 44–121)
BUN/Creatinine Ratio: 16 (ref 10–24)
BUN: 14 mg/dL (ref 8–27)
Bilirubin Total: 0.5 mg/dL (ref 0.0–1.2)
CO2: 22 mmol/L (ref 20–29)
Calcium: 9.9 mg/dL (ref 8.6–10.2)
Chloride: 103 mmol/L (ref 96–106)
Creatinine, Ser: 0.87 mg/dL (ref 0.76–1.27)
Globulin, Total: 2.3 g/dL (ref 1.5–4.5)
Glucose: 122 mg/dL — ABNORMAL HIGH (ref 70–99)
Potassium: 4.5 mmol/L (ref 3.5–5.2)
Sodium: 143 mmol/L (ref 134–144)
Total Protein: 6.7 g/dL (ref 6.0–8.5)
eGFR: 92 mL/min/{1.73_m2} (ref >59)

## 2023-06-30 LAB — BAYER DCA HB A1C WAIVED: HB A1C (BAYER DCA - WAIVED): 6 % — ABNORMAL HIGH (ref 4.8–5.6)

## 2023-06-30 LAB — LIPID PANEL
Chol/HDL Ratio: 3.3 {ratio} (ref 0.0–5.0)
Cholesterol, Total: 124 mg/dL (ref 100–199)
HDL: 38 mg/dL — ABNORMAL LOW (ref >39)
LDL Chol Calc (NIH): 71 mg/dL (ref 0–99)
Triglycerides: 76 mg/dL (ref 0–149)
VLDL Cholesterol Cal: 15 mg/dL (ref 5–40)

## 2023-06-30 MED ORDER — ATORVASTATIN CALCIUM 40 MG PO TABS
ORAL_TABLET | ORAL | 3 refills | Status: AC
Start: 2023-06-30 — End: ?

## 2023-06-30 MED ORDER — GLIMEPIRIDE 2 MG PO TABS: 2.0000 mg | ORAL_TABLET | Freq: Every morning | ORAL | 3 refills | Status: DC

## 2023-06-30 MED ORDER — LOSARTAN POTASSIUM 100 MG PO TABS: 100.0000 mg | ORAL_TABLET | Freq: Every day | ORAL | 3 refills | Status: DC

## 2023-06-30 NOTE — Progress Notes (Signed)
Subjective:  Patient ID: Barry Smith.,  male    DOB: October 28, 1950  Age: 72 y.o.    CC: Medical Management of Chronic Issues   HPI Barry Rozzelle Sr. presents for  follow-up of hypertension. Patient has no history of headache chest pain or shortness of breath or recent cough. Patient also denies symptoms of TIA such as numbness weakness lateralizing. Patient denies side effects from medication. States taking it regularly.  Patient also  in for follow-up of elevated cholesterol. Doing well without complaints on current medication. Denies side effects  including myalgia and arthralgia and nausea. Also in today for liver function testing. Currently no chest pain, shortness of breath or other cardiovascular related symptoms noted.  Follow-up of diabetes. Patient does check blood sugar at home. Readings run between 90 and 120 Patient denies symptoms such as excessive hunger or urinary frequency, excessive hunger, nausea No significant hypoglycemic spells noted. Medications reviewed. Pt reports taking them regularly. Pt. denies complication/adverse reaction today.    History Barry Smith has a past medical history of Anxiety, Essential hypertension, Hyperlipidemia, and Type 2 diabetes mellitus (HCC).   He has a past surgical history that includes Appendectomy.   His family history includes Cancer in his sister; Diabetes in his brother, brother, brother, brother, brother, father, sister, sister, sister, sister, sister, and sister; Pneumonia in his brother.He reports that he has never smoked. He has never used smokeless tobacco. He reports that he does not drink alcohol and does not use drugs.  Current Outpatient Medications on File Prior to Visit  Medication Sig Dispense Refill   aspirin 81 MG tablet Take 81 mg by mouth daily.     cholecalciferol (VITAMIN D) 1000 UNITS tablet Take 1 tablet (1,000 Units total) by mouth daily. 90 tablet 3   fluticasone (FLONASE) 50 MCG/ACT nasal spray Place  1 spray into both nostrils 2 (two) times daily as needed for allergies or rhinitis. 16 g 6   gabapentin (NEURONTIN) 300 MG capsule TAKE ONE (1) CAPSULE THREE (3) TIMES EACH DAY 270 capsule 3   metFORMIN (GLUCOPHAGE) 500 MG tablet TAKE 2 TABLETS EVERY MORNING AND 3 TABLETS AT BEDTIME 450 tablet 3   Multiple Vitamin (MULTIVITAMIN) capsule Take 1 capsule by mouth daily.     Omega-3 Fatty Acids (FISH OIL) 1200 MG CAPS Take 2 capsules by mouth daily.      sitaGLIPtin (JANUVIA) 50 MG tablet TAKE ONE (1) TABLET EACH DAY 90 tablet 3   tamsulosin (FLOMAX) 0.4 MG CAPS capsule TAKE 1 CAPSULE EVERY DAY 90 capsule 3   Vitamin D, Ergocalciferol, (DRISDOL) 1.25 MG (50000 UNIT) CAPS capsule TAKE 1 CAPSULE EVERY WEEK 12 capsule 1   No current facility-administered medications on file prior to visit.    ROS Review of Systems  Constitutional: Negative.   HENT: Negative.    Eyes:  Negative for visual disturbance.  Respiratory:  Negative for cough and shortness of breath.   Cardiovascular:  Negative for chest pain and leg swelling.  Gastrointestinal:  Negative for abdominal pain, diarrhea, nausea and vomiting.  Genitourinary:  Negative for difficulty urinating.  Musculoskeletal:  Negative for arthralgias and myalgias.  Skin:  Negative for rash.  Neurological:  Negative for headaches.  Psychiatric/Behavioral:  Negative for sleep disturbance.     Objective:  BP 127/63   Pulse 73   Temp (!) 97.5 F (36.4 C)   Ht 5\' 6"  (1.676 m)   Wt 174 lb 12.8 oz (79.3 kg)   SpO2 97%  BMI 28.21 kg/m   BP Readings from Last 3 Encounters:  06/30/23 127/63  05/13/23 126/64  03/25/23 127/65    Wt Readings from Last 3 Encounters:  06/30/23 174 lb 12.8 oz (79.3 kg)  05/13/23 178 lb (80.7 kg)  03/25/23 177 lb 3.2 oz (80.4 kg)     Physical Exam Vitals reviewed.  Constitutional:      Appearance: He is well-developed.  HENT:     Head: Normocephalic and atraumatic.     Right Ear: External ear normal.      Left Ear: External ear normal.     Mouth/Throat:     Pharynx: No oropharyngeal exudate or posterior oropharyngeal erythema.  Eyes:     Pupils: Pupils are equal, round, and reactive to light.  Cardiovascular:     Rate and Rhythm: Normal rate and regular rhythm.     Heart sounds: No murmur heard. Pulmonary:     Effort: No respiratory distress.     Breath sounds: Normal breath sounds.  Musculoskeletal:     Cervical back: Normal range of motion and neck supple.  Neurological:     Mental Status: He is alert and oriented to person, place, and time.     Diabetic Foot Exam - Simple   No data filed     Lab Results  Component Value Date   HGBA1C 6.5 (H) 03/25/2023   HGBA1C 6.4 (H) 12/09/2022   HGBA1C 6.7 (H) 09/02/2022    Assessment & Plan:   Barry Smith was seen today for medical management of chronic issues.  Diagnoses and all orders for this visit:  Hypertension associated with diabetes (HCC) -     CBC with Differential/Platelet -     CMP14+EGFR -     losartan (COZAAR) 100 MG tablet; Take 1 tablet (100 mg total) by mouth daily.  Hyperlipidemia associated with type 2 diabetes mellitus (HCC) -     Lipid panel -     atorvastatin (LIPITOR) 40 MG tablet; TAKE ONE (1) TABLET EACH DAY  Type 2 diabetes mellitus with hyperglycemia, without long-term current use of insulin (HCC) -     Bayer DCA Hb A1c Waived -     glimepiride (AMARYL) 2 MG tablet; Take 1 tablet (2 mg total) by mouth every morning.  Diabetic polyneuropathy associated with type 2 diabetes mellitus (HCC) -     glimepiride (AMARYL) 2 MG tablet; Take 1 tablet (2 mg total) by mouth every morning.   I have discontinued [name]'s amoxicillin. I have also changed his glimepiride and losartan. Additionally, I am having him maintain his aspirin, multivitamin, Fish Oil, cholecalciferol, tamsulosin, gabapentin, metFORMIN, sitaGLIPtin, Vitamin D (Ergocalciferol), fluticasone, and atorvastatin.  Meds ordered this encounter   Medications   atorvastatin (LIPITOR) 40 MG tablet    Sig: TAKE ONE (1) TABLET EACH DAY    Dispense:  90 tablet    Refill:  3   glimepiride (AMARYL) 2 MG tablet    Sig: Take 1 tablet (2 mg total) by mouth every morning.    Dispense:  90 tablet    Refill:  3   losartan (COZAAR) 100 MG tablet    Sig: Take 1 tablet (100 mg total) by mouth daily.    Dispense:  90 tablet    Refill:  3     Follow-up: Return in about 3 months (around 09/28/2023).  Mechele Claude, M.D.

## 2023-06-30 NOTE — Addendum Note (Signed)
Addended by: Adella Hare B on: 06/30/2023 10:32 AM   Modules accepted: Orders

## 2023-07-01 NOTE — Progress Notes (Signed)
Hello [name],  Your lab result is normal and/or stable.Some minor variations that are not significant are commonly marked abnormal, but do not represent any medical problem for you.  Best regards, Claretta Fraise, M.D.

## 2023-07-03 ENCOUNTER — Other Ambulatory Visit: Payer: Self-pay | Admitting: Family Medicine

## 2023-07-03 DIAGNOSIS — E1165 Type 2 diabetes mellitus with hyperglycemia: Secondary | ICD-10-CM

## 2023-07-03 DIAGNOSIS — E1142 Type 2 diabetes mellitus with diabetic polyneuropathy: Secondary | ICD-10-CM

## 2023-07-14 ENCOUNTER — Telehealth: Payer: Self-pay | Admitting: Family Medicine

## 2023-07-14 NOTE — Telephone Encounter (Signed)
WRONG CHART

## 2023-07-14 NOTE — Telephone Encounter (Signed)
Copied from CRM [id number]. Topic: Clinical - Medication Question >> Jul 14, 2023  9:57 AM Barry Smith wrote: Reason for CRM: Patients mom called in to follow up on prescription of fish oil but advised I do not see anything being ordered. / please call [phone]

## 2023-08-06 NOTE — Telephone Encounter (Signed)
 Spouse calling in concerning some medication that was prescribed to him during appointment with dr. Zollie that was a substitution for the over the counter natural fish oil . Patient wife says patient has not received medication or pharmacy has not got this prescription . The appointment was 12/3 . Patient is still waiting for prescription to be sent to pharmacy . Call back number [phone] wife

## 2023-08-10 ENCOUNTER — Other Ambulatory Visit: Payer: Self-pay | Admitting: Family Medicine

## 2023-08-10 MED ORDER — OMEGA-3-ACID ETHYL ESTERS 1 G PO CAPS: 2.0000 g | ORAL_CAPSULE | Freq: Two times a day (BID) | ORAL | 3 refills | Status: DC

## 2023-08-10 NOTE — Telephone Encounter (Signed)
 I sent it to the drug store today. Let me know if he prefers it to go to Mail order.

## 2023-08-10 NOTE — Telephone Encounter (Signed)
 Attempted to contact - NA

## 2023-08-11 NOTE — Telephone Encounter (Signed)
 Wife aware medication has been sent to pharmacy

## 2023-08-13 ENCOUNTER — Encounter: Payer: Self-pay | Admitting: Family Medicine

## 2023-08-13 ENCOUNTER — Ambulatory Visit (INDEPENDENT_AMBULATORY_CARE_PROVIDER_SITE_OTHER): Payer: Medicare HMO | Admitting: Family Medicine

## 2023-08-13 VITALS — BP 112/66 | HR 92 | Temp 98.9°F | Ht 66.0 in | Wt 178.0 lb

## 2023-08-13 DIAGNOSIS — J069 Acute upper respiratory infection, unspecified: Secondary | ICD-10-CM

## 2023-08-13 MED ORDER — FLUTICASONE PROPIONATE 50 MCG/ACT NA SUSP: 1.0000 | Freq: Two times a day (BID) | NASAL | 6 refills | Status: DC | PRN

## 2023-08-13 MED ORDER — BENZONATATE 200 MG PO CAPS: 200.0000 mg | ORAL_CAPSULE | Freq: Two times a day (BID) | ORAL | 1 refills | Status: DC

## 2023-08-13 NOTE — Progress Notes (Signed)
BP 112/66   Pulse 92   Temp 98.9 F (37.2 C)   Ht 5\' 6"  (1.676 m)   Wt 178 lb (80.7 kg)   SpO2 98%   BMI 28.73 kg/m    Subjective:   Patient ID: Barry Jubilee., male    DOB: 1950/10/16, 73 y.o.   MRN: [id number]  HPI: Barry Kanan. is a 73 y.o. male presenting on 08/13/2023 for URI   URI  Associated symptoms include congestion, coughing, diarrhea, rhinorrhea and a sore throat. Pertinent negatives include no chest pain, ear pain, headaches or rash.   Symptoms began 2 days ago with productive cough and congestion followed by diarrhea a day after. Sputum described as clear. Denies blood in sputum. He has used Mucinex DM with some relief. Diarrhea has since improved. He continues to hydrate. Has not checked fever at home but wife says "he feels warm". Reports fatigue, chills, and mild sore throat. Denies being around any sick contacts he's aware of.  He just feels like it is not getting better.  Relevant past medical, surgical, family and social history reviewed and updated as indicated. Interim medical history since our last visit reviewed. Allergies and medications reviewed and updated.  Review of Systems  Constitutional:  Positive for chills and fatigue.  HENT:  Positive for congestion, rhinorrhea and sore throat. Negative for ear pain.   Eyes:  Negative for discharge.  Respiratory:  Positive for cough and shortness of breath.   Cardiovascular:  Negative for chest pain and leg swelling.  Gastrointestinal:  Positive for diarrhea.  Musculoskeletal:  Negative for myalgias.  Skin:  Negative for rash.  Neurological:  Negative for weakness and headaches.  Psychiatric/Behavioral:  Negative for agitation and confusion.   All other systems reviewed and are negative.   Per HPI unless specifically indicated above   Allergies as of 08/13/2023   No Known Allergies      Medication List        Accurate as of August 13, 2023  1:03 PM. If you have any questions, ask  your nurse or doctor.          aspirin 81 MG tablet Take 81 mg by mouth daily.   atorvastatin 40 MG tablet Commonly known as: LIPITOR TAKE ONE (1) TABLET EACH DAY   benzonatate 200 MG capsule Commonly known as: TESSALON Take 1 capsule (200 mg total) by mouth 2 (two) times daily. Started by: Elige Radon Vernetta Dizdarevic   cholecalciferol 1000 units tablet Commonly known as: VITAMIN D Take 1 tablet (1,000 Units total) by mouth daily.   Fish Oil 1200 MG Caps Take 2 capsules by mouth daily.   fluticasone 50 MCG/ACT nasal spray Commonly known as: FLONASE Place 1 spray into both nostrils 2 (two) times daily as needed for allergies or rhinitis.   gabapentin 300 MG capsule Commonly known as: NEURONTIN TAKE ONE (1) CAPSULE THREE (3) TIMES EACH DAY   glimepiride 2 MG tablet Commonly known as: AMARYL Take 1 tablet (2 mg total) by mouth every morning.   losartan 100 MG tablet Commonly known as: COZAAR Take 1 tablet (100 mg total) by mouth daily.   metFORMIN 500 MG tablet Commonly known as: GLUCOPHAGE TAKE 2 TABLETS EVERY MORNING AND 3 TABLETS AT BEDTIME   multivitamin capsule Take 1 capsule by mouth daily.   omega-3 acid ethyl esters 1 g capsule Commonly known as: LOVAZA Take 2 capsules (2 g total) by mouth 2 (two) times daily.   sitaGLIPtin 50  MG tablet Commonly known as: Januvia TAKE ONE (1) TABLET EACH DAY   tamsulosin 0.4 MG Caps capsule Commonly known as: FLOMAX TAKE 1 CAPSULE EVERY DAY   Vitamin D (Ergocalciferol) 1.25 MG (50000 UNIT) Caps capsule Commonly known as: DRISDOL TAKE 1 CAPSULE EVERY WEEK         Objective:   BP 112/66   Pulse 92   Temp 98.9 F (37.2 C)   Ht 5\' 6"  (1.676 m)   Wt 178 lb (80.7 kg)   SpO2 98%   BMI 28.73 kg/m   Wt Readings from Last 3 Encounters:  08/13/23 178 lb (80.7 kg)  06/30/23 174 lb 12.8 oz (79.3 kg)  05/13/23 178 lb (80.7 kg)    Physical Exam Constitutional:      General: He is not in acute distress.     Appearance: Normal appearance. He is well-developed. He is not diaphoretic.  HENT:     Right Ear: Tympanic membrane normal.     Left Ear: Tympanic membrane normal.     Nose: Congestion present.     Right Sinus: No maxillary sinus tenderness or frontal sinus tenderness.     Left Sinus: No maxillary sinus tenderness or frontal sinus tenderness.     Mouth/Throat:     Mouth: Mucous membranes are moist.     Pharynx: Posterior oropharyngeal erythema present. No pharyngeal swelling, oropharyngeal exudate or uvula swelling.     Tonsils: No tonsillar exudate.  Eyes:     Conjunctiva/sclera: Conjunctivae normal.  Neck:     Thyroid: No thyromegaly.  Cardiovascular:     Rate and Rhythm: Normal rate and regular rhythm.     Heart sounds: Normal heart sounds. No murmur heard. Pulmonary:     Effort: Pulmonary effort is normal. No accessory muscle usage or respiratory distress.     Breath sounds: No wheezing or rhonchi.  Musculoskeletal:        General: Normal range of motion.  Lymphadenopathy:     Comments: No lymphadenopathy  Skin:    General: Skin is warm and dry.  Neurological:     Mental Status: He is alert and oriented to person, place, and time.     Motor: No weakness.  Psychiatric:        Behavior: Behavior normal.       Assessment & Plan:   Problem List Items Addressed This Visit   None Visit Diagnoses       Upper respiratory tract infection, unspecified type    -  Primary   Relevant Medications   benzonatate (TESSALON) 200 MG capsule   fluticasone (FLONASE) 50 MCG/ACT nasal spray   Other Relevant Orders   COVID-19, Flu A+B and RSV       Diagnosis is URI. Will swab for COVID-19, RSV, and Influenza A + B. Will order Flonase 50 MCG/ACT for congestion and TESSALON Pearls 200 mg for cough. Recommend Benadryl to use at night to help with cough/congestion.  Follow up plan: Return if symptoms worsen or fail to improve.  Counseling provided for all of the vaccine  components Orders Placed This Encounter  Procedures   COVID-19, Flu A+B and RSV    Maximino Sarin PA-S 08/13/2023, 1:03 PM   Patient seen and examined with PA student, agree with assessment above.  Will treat conservatively and will await test results. Arville Care, MD [facility] 08/13/2023, 1:03 PM

## 2023-08-14 LAB — COVID-19, FLU A+B AND RSV
Influenza A, NAA: NOT DETECTED
Influenza B, NAA: NOT DETECTED
RSV, NAA: NOT DETECTED
SARS-CoV-2, NAA: NOT DETECTED

## 2023-08-17 ENCOUNTER — Encounter: Payer: Self-pay | Admitting: Family Medicine

## 2023-09-29 ENCOUNTER — Other Ambulatory Visit: Payer: Self-pay | Admitting: Family Medicine

## 2023-09-29 DIAGNOSIS — E559 Vitamin D deficiency, unspecified: Secondary | ICD-10-CM

## 2023-09-30 ENCOUNTER — Other Ambulatory Visit: Payer: Self-pay | Admitting: Family Medicine

## 2023-09-30 DIAGNOSIS — N401 Enlarged prostate with lower urinary tract symptoms: Secondary | ICD-10-CM

## 2023-10-01 ENCOUNTER — Encounter: Payer: Self-pay | Admitting: Family Medicine

## 2023-10-01 ENCOUNTER — Ambulatory Visit: Payer: Medicare HMO | Admitting: Family Medicine

## 2023-10-01 VITALS — BP 138/66 | HR 73 | Temp 97.7°F | Ht 66.0 in | Wt 176.0 lb

## 2023-10-01 DIAGNOSIS — Z7984 Long term (current) use of oral hypoglycemic drugs: Secondary | ICD-10-CM | POA: Diagnosis not present

## 2023-10-01 DIAGNOSIS — I152 Hypertension secondary to endocrine disorders: Secondary | ICD-10-CM

## 2023-10-01 DIAGNOSIS — E785 Hyperlipidemia, unspecified: Secondary | ICD-10-CM

## 2023-10-01 DIAGNOSIS — E1165 Type 2 diabetes mellitus with hyperglycemia: Secondary | ICD-10-CM

## 2023-10-01 DIAGNOSIS — E1169 Type 2 diabetes mellitus with other specified complication: Secondary | ICD-10-CM

## 2023-10-01 DIAGNOSIS — E1159 Type 2 diabetes mellitus with other circulatory complications: Secondary | ICD-10-CM | POA: Diagnosis not present

## 2023-10-01 LAB — BAYER DCA HB A1C WAIVED: HB A1C (BAYER DCA - WAIVED): 6 % — ABNORMAL HIGH (ref 4.8–5.6)

## 2023-10-01 MED ORDER — ACCU-CHEK GUIDE TEST VI STRP: 1.0000 | ORAL_STRIP | 10 refills | Status: DC | PRN

## 2023-10-01 NOTE — Progress Notes (Signed)
 Subjective:  Patient ID: Barry Jubilee.,  male    DOB: 1951/05/25  Age: 73 y.o.    CC: Medical Management of Chronic Issues (Needs accu chek guide )   HPI Barry Stryker Veasey Sr. presents for  follow-up of hypertension. Patient has no history of headache chest pain or shortness of breath or recent cough. Patient also denies symptoms of TIA such as numbness weakness lateralizing. Patient denies side effects from medication. States taking it regularly.  Patient also  in for follow-up of elevated cholesterol. Doing well without complaints on current medication. Denies side effects  including myalgia and arthralgia and nausea. Also in today for liver function testing. Currently no chest pain, shortness of breath or other cardiovascular related symptoms noted.  Follow-up of diabetes. Patient does not check blood sugar at home. Patient denies symptoms such as excessive hunger or urinary frequency, excessive hunger, nausea No significant hypoglycemic spells noted. Medications reviewed. Pt reports taking them regularly. Pt. denies complication/adverse reaction today.    History Barry Smith has a past medical history of Anxiety, Essential hypertension, Hyperlipidemia, and Type 2 diabetes mellitus (HCC).   He has a past surgical history that includes Appendectomy.   His family history includes Cancer in his sister; Diabetes in his brother, brother, brother, brother, brother, father, sister, sister, sister, sister, sister, and sister; Pneumonia in his brother.He reports that he has never smoked. He has never used smokeless tobacco. He reports that he does not drink alcohol and does not use drugs.  Current Outpatient Medications on File Prior to Visit  Medication Sig Dispense Refill   aspirin 81 MG tablet Take 81 mg by mouth daily.     atorvastatin (LIPITOR) 40 MG tablet TAKE ONE (1) TABLET EACH DAY 90 tablet 3   cholecalciferol (VITAMIN D) 1000 UNITS tablet Take 1 tablet (1,000 Units total) by  mouth daily. 90 tablet 3   fluticasone (FLONASE) 50 MCG/ACT nasal spray Place 1 spray into both nostrils 2 (two) times daily as needed for allergies or rhinitis. 16 g 6   gabapentin (NEURONTIN) 300 MG capsule TAKE ONE (1) CAPSULE THREE (3) TIMES EACH DAY 270 capsule 3   glimepiride (AMARYL) 2 MG tablet Take 1 tablet (2 mg total) by mouth every morning. 90 tablet 3   losartan (COZAAR) 100 MG tablet Take 1 tablet (100 mg total) by mouth daily. 90 tablet 3   metFORMIN (GLUCOPHAGE) 500 MG tablet TAKE 2 TABLETS EVERY MORNING AND 3 TABLETS AT BEDTIME 450 tablet 3   Multiple Vitamin (MULTIVITAMIN) capsule Take 1 capsule by mouth daily.     omega-3 acid ethyl esters (LOVAZA) 1 g capsule Take 2 capsules (2 g total) by mouth 2 (two) times daily. 360 capsule 3   Omega-3 Fatty Acids (FISH OIL) 1200 MG CAPS Take 2 capsules by mouth daily.      sitaGLIPtin (JANUVIA) 50 MG tablet TAKE ONE (1) TABLET EACH DAY 90 tablet 3   tamsulosin (FLOMAX) 0.4 MG CAPS capsule TAKE 1 CAPSULE EVERY DAY 90 capsule 0   Vitamin D, Ergocalciferol, (DRISDOL) 1.25 MG (50000 UNIT) CAPS capsule TAKE 1 CAPSULE EVERY WEEK 12 capsule 1   No current facility-administered medications on file prior to visit.    ROS Review of Systems  Constitutional:  Negative for fever.  Respiratory:  Negative for shortness of breath.   Cardiovascular:  Negative for chest pain.  Musculoskeletal:  Negative for arthralgias.  Skin:  Negative for rash.    Objective:  BP 138/66  Pulse 73   Temp 97.7 F (36.5 C)   Ht 5\' 6"  (1.676 m)   Wt 176 lb (79.8 kg)   SpO2 97%   BMI 28.41 kg/m   BP Readings from Last 3 Encounters:  10/01/23 138/66  08/13/23 112/66  06/30/23 127/63    Wt Readings from Last 3 Encounters:  10/01/23 176 lb (79.8 kg)  08/13/23 178 lb (80.7 kg)  06/30/23 174 lb 12.8 oz (79.3 kg)     Physical Exam Vitals reviewed.  Constitutional:      Appearance: He is well-developed.  HENT:     Head: Normocephalic and atraumatic.      Right Ear: External ear normal.     Left Ear: External ear normal.     Mouth/Throat:     Pharynx: No oropharyngeal exudate or posterior oropharyngeal erythema.  Eyes:     Pupils: Pupils are equal, round, and reactive to light.  Cardiovascular:     Rate and Rhythm: Normal rate and regular rhythm.     Heart sounds: No murmur heard. Pulmonary:     Effort: No respiratory distress.     Breath sounds: Normal breath sounds.  Musculoskeletal:     Cervical back: Normal range of motion and neck supple.  Neurological:     Mental Status: He is alert and oriented to person, place, and time.     Diabetic Foot Exam - Simple   No data filed     Lab Results  Component Value Date   HGBA1C 6.0 (H) 10/01/2023   HGBA1C 6.0 (H) 06/30/2023   HGBA1C 6.5 (H) 03/25/2023    Assessment & Plan:   Barry Smith was seen today for medical management of chronic issues.  Diagnoses and all orders for this visit:  Type 2 diabetes mellitus with hyperglycemia, without long-term current use of insulin (HCC) -     Bayer DCA Hb A1c Waived  Hyperlipidemia associated with type 2 diabetes mellitus (HCC) -     Lipid panel  Hypertension associated with diabetes (HCC) -     CBC with Differential/Platelet -     CMP14+EGFR  Other orders -     glucose blood (ACCU-CHEK GUIDE TEST) test strip; 1 each by Other route as needed for other. Use as instructed   I have discontinued Barry Leyden Sr.'s benzonatate. I am also having him maintain his aspirin, multivitamin, Fish Oil, cholecalciferol, gabapentin, metFORMIN, sitaGLIPtin, atorvastatin, glimepiride, losartan, omega-3 acid ethyl esters, fluticasone, Vitamin D (Ergocalciferol), tamsulosin, and Accu-Chek Guide Test.  Meds ordered this encounter  Medications   glucose blood (ACCU-CHEK GUIDE TEST) test strip    Sig: 1 each by Other route as needed for other. Use as instructed    Dispense:  100 each    Refill:  10     Follow-up: Return in about 3 months (around  01/01/2024).  Barry Smith, M.D.

## 2023-10-02 LAB — CBC WITH DIFFERENTIAL/PLATELET
Basophils Absolute: 0 10*3/uL (ref 0.0–0.2)
Basos: 0 %
EOS (ABSOLUTE): 0.2 10*3/uL (ref 0.0–0.4)
Eos: 3 %
Hematocrit: 39.9 % (ref 37.5–51.0)
Hemoglobin: 13 g/dL (ref 13.0–17.7)
Immature Grans (Abs): 0 10*3/uL (ref 0.0–0.1)
Immature Granulocytes: 0 %
Lymphocytes Absolute: 1.8 10*3/uL (ref 0.7–3.1)
Lymphs: 36 %
MCH: 28.6 pg (ref 26.6–33.0)
MCHC: 32.6 g/dL (ref 31.5–35.7)
MCV: 88 fL (ref 79–97)
Monocytes Absolute: 0.4 10*3/uL (ref 0.1–0.9)
Monocytes: 8 %
Neutrophils Absolute: 2.7 10*3/uL (ref 1.4–7.0)
Neutrophils: 53 %
Platelets: 298 10*3/uL (ref 150–450)
RBC: 4.54 x10E6/uL (ref 4.14–5.80)
RDW: 13.7 % (ref 11.6–15.4)
WBC: 5 10*3/uL (ref 3.4–10.8)

## 2023-10-02 LAB — LIPID PANEL
Chol/HDL Ratio: 2.9 ratio (ref 0.0–5.0)
Cholesterol, Total: 112 mg/dL (ref 100–199)
HDL: 39 mg/dL — ABNORMAL LOW (ref >39)
LDL Chol Calc (NIH): 57 mg/dL (ref 0–99)
Triglycerides: 81 mg/dL (ref 0–149)
VLDL Cholesterol Cal: 16 mg/dL (ref 5–40)

## 2023-10-02 LAB — CMP14+EGFR
ALT: 10 IU/L (ref 0–44)
AST: 13 IU/L (ref 0–40)
Albumin: 4.6 g/dL (ref 3.8–4.8)
Alkaline Phosphatase: 60 IU/L (ref 44–121)
BUN/Creatinine Ratio: 13 (ref 10–24)
BUN: 11 mg/dL (ref 8–27)
Bilirubin Total: 0.4 mg/dL (ref 0.0–1.2)
CO2: 23 mmol/L (ref 20–29)
Calcium: 9.7 mg/dL (ref 8.6–10.2)
Chloride: 105 mmol/L (ref 96–106)
Creatinine, Ser: 0.87 mg/dL (ref 0.76–1.27)
Globulin, Total: 2.4 g/dL (ref 1.5–4.5)
Glucose: 94 mg/dL (ref 70–99)
Potassium: 4.2 mmol/L (ref 3.5–5.2)
Sodium: 144 mmol/L (ref 134–144)
Total Protein: 7 g/dL (ref 6.0–8.5)
eGFR: 92 mL/min/{1.73_m2} (ref >59)

## 2023-10-04 ENCOUNTER — Encounter: Payer: Self-pay | Admitting: Family Medicine

## 2023-10-04 NOTE — Progress Notes (Signed)
Hello [name],  Your lab result is normal and/or stable.Some minor variations that are not significant are commonly marked abnormal, but do not represent any medical problem for you.  Best regards, Claretta Fraise, M.D.

## 2023-11-18 ENCOUNTER — Ambulatory Visit

## 2023-11-18 ENCOUNTER — Ambulatory Visit (INDEPENDENT_AMBULATORY_CARE_PROVIDER_SITE_OTHER)

## 2023-11-18 DIAGNOSIS — E1142 Type 2 diabetes mellitus with diabetic polyneuropathy: Secondary | ICD-10-CM

## 2023-11-18 NOTE — Progress Notes (Signed)
 Barry Alek Borges Sr. arrived 11/18/2023 and has given verbal consent to obtain images and complete their overdue diabetic retinal screening.  The images have been sent to an ophthalmologist or optometrist for review and interpretation.  Results will be sent back to Barry Cleaver, MD for review.  Patient has been informed they will be contacted when we receive the results via telephone or MyChart  I was successful in getting a good retinal photo of [name] left eye. I was unsuccessful in getting a good retinal photo of his right eye, due to his pupil being too small/would not dilate enough.

## 2023-12-12 ENCOUNTER — Other Ambulatory Visit: Payer: Self-pay | Admitting: Family Medicine

## 2023-12-12 DIAGNOSIS — N401 Enlarged prostate with lower urinary tract symptoms: Secondary | ICD-10-CM

## 2024-01-01 ENCOUNTER — Encounter: Payer: Self-pay | Admitting: Family Medicine

## 2024-01-01 ENCOUNTER — Ambulatory Visit (INDEPENDENT_AMBULATORY_CARE_PROVIDER_SITE_OTHER): Admitting: Family Medicine

## 2024-01-01 VITALS — BP 115/63 | HR 90 | Temp 98.5°F | Ht 66.0 in | Wt 177.0 lb

## 2024-01-01 DIAGNOSIS — R051 Acute cough: Secondary | ICD-10-CM | POA: Diagnosis not present

## 2024-01-01 DIAGNOSIS — R5383 Other fatigue: Secondary | ICD-10-CM | POA: Diagnosis not present

## 2024-01-01 DIAGNOSIS — J069 Acute upper respiratory infection, unspecified: Secondary | ICD-10-CM | POA: Diagnosis not present

## 2024-01-01 MED ORDER — FLUTICASONE PROPIONATE 50 MCG/ACT NA SUSP
1.0000 | Freq: Two times a day (BID) | NASAL | 6 refills | Status: AC | PRN
Start: 2024-01-01 — End: ?

## 2024-01-01 MED ORDER — PROMETHAZINE-DM 6.25-15 MG/5ML PO SYRP: 2.5000 mL | ORAL_SOLUTION | Freq: Four times a day (QID) | ORAL | 0 refills | Status: DC | PRN

## 2024-01-01 MED ORDER — ALBUTEROL SULFATE HFA 108 (90 BASE) MCG/ACT IN AERS: 2.0000 | INHALATION_SPRAY | Freq: Four times a day (QID) | RESPIRATORY_TRACT | 0 refills | Status: AC | PRN

## 2024-01-01 MED ORDER — BENZONATATE 100 MG PO CAPS: 100.0000 mg | ORAL_CAPSULE | Freq: Three times a day (TID) | ORAL | 0 refills | Status: AC | PRN

## 2024-01-01 NOTE — Patient Instructions (Addendum)
 Can still take mucinex  as long as it is not mixed with dextromethorphan

## 2024-01-01 NOTE — Progress Notes (Signed)
 Subjective:  Patient ID: Barry Creed., male    DOB: [birth year], 73 y.o.   MRN: [id number]  Patient Care Team: Roise Cleaver, MD as PCP - General (Family Medicine) Gerard Knight, MD as PCP - Cardiology (Cardiology)   Chief Complaint:  cough, congestion  (X 2 days/Sore throat)   HPI: Barry Manley. is a 74 y.o. male presenting on 01/01/2024 for cough, congestion  (X 2 days/Sore throat)  HPI Patient presents today with cough of two days that is worsening. Productive with clear sputum with green streaks. States that he gets warm sometimes. Reports chest pain with coughing. States that he is coughing all night. Reports fatigue and sore throat.  Denies ear pain, teeth pain, rhinorrhea. Tried mucinex    Relevant past medical, surgical, family, and social history reviewed and updated as indicated.  Allergies and medications reviewed and updated. Data reviewed: Chart in Epic.   Past Medical History:  Diagnosis Date   Anxiety    Essential hypertension    Hyperlipidemia    Type 2 diabetes mellitus (HCC)     Past Surgical History:  Procedure Laterality Date   APPENDECTOMY      Social History   Socioeconomic History   Marital status: Married    Spouse name: Caryl Clas   Number of children: Not on file   Years of education: Not on file   Highest education level: Not on file  Occupational History   Occupation: drives Psychologist, clinical    Comment: full time  Tobacco Use   Smoking status: Never   Smokeless tobacco: Never  Vaping Use   Vaping status: Never Used  Substance and Sexual Activity   Alcohol use: No   Drug use: No   Sexual activity: Yes  Other Topics Concern   Not on file  Social History Narrative   Lives on one level with his wife - daughter lives in [place], son next door   Social Drivers of Health   Financial Resource Strain: Low Risk  (09/24/2022)   Overall Financial Resource Strain (CARDIA)    Difficulty of Paying Living Expenses:  Not hard at all  Food Insecurity: No Food Insecurity (09/24/2022)   Hunger Vital Sign    Worried About Running Out of Food in the Last Year: Never true    Ran Out of Food in the Last Year: Never true  Transportation Needs: No Transportation Needs (09/24/2022)   PRAPARE - Administrator, Civil Service (Medical): No    Lack of Transportation (Non-Medical): No  Physical Activity: Sufficiently Active (09/24/2022)   Exercise Vital Sign    Days of Exercise per Week: 5 days    Minutes of Exercise per Session: 60 min  Stress: No Stress Concern Present (09/24/2022)   [employer] of Occupational Health - Occupational Stress Questionnaire    Feeling of Stress : Not at all  Social Connections: Moderately Isolated (09/24/2022)   Social Connection and Isolation Panel [NHANES]    Frequency of Communication with Friends and Family: More than three times a week    Frequency of Social Gatherings with Friends and Family: More than three times a week    Attends Religious Services: Never    Database administrator or Organizations: No    Attends Banker Meetings: Never    Marital Status: Married  Catering manager Violence: Not At Risk (09/24/2022)   Humiliation, Afraid, Rape, and Kick questionnaire    Fear of Current or Ex-Partner:  No    Emotionally Abused: No    Physically Abused: No    Sexually Abused: No    Outpatient Encounter Medications as of 01/01/2024  Medication Sig   aspirin 81 MG tablet Take 81 mg by mouth daily.   atorvastatin  (LIPITOR) 40 MG tablet TAKE ONE (1) TABLET EACH DAY   cholecalciferol (VITAMIN D ) 1000 UNITS tablet Take 1 tablet (1,000 Units total) by mouth daily.   fluticasone  (FLONASE ) 50 MCG/ACT nasal spray Place 1 spray into both nostrils 2 (two) times daily as needed for allergies or rhinitis.   gabapentin  (NEURONTIN ) 300 MG capsule TAKE ONE (1) CAPSULE THREE (3) TIMES EACH DAY   glimepiride  (AMARYL ) 2 MG tablet Take 1 tablet (2 mg total) by mouth  every morning.   glucose blood (ACCU-CHEK GUIDE TEST) test strip 1 each by Other route as needed for other. Use as instructed   losartan  (COZAAR ) 100 MG tablet Take 1 tablet (100 mg total) by mouth daily.   metFORMIN  (GLUCOPHAGE ) 500 MG tablet TAKE 2 TABLETS EVERY MORNING AND 3 TABLETS AT BEDTIME   Multiple Vitamin (MULTIVITAMIN) capsule Take 1 capsule by mouth daily.   omega-3 acid ethyl esters (LOVAZA ) 1 g capsule Take 2 capsules (2 g total) by mouth 2 (two) times daily.   Omega-3 Fatty Acids (FISH OIL) 1200 MG CAPS Take 2 capsules by mouth daily.    sitaGLIPtin  (JANUVIA ) 50 MG tablet TAKE ONE (1) TABLET EACH DAY   tamsulosin  (FLOMAX ) 0.4 MG CAPS capsule TAKE 1 CAPSULE EVERY DAY   Vitamin D , Ergocalciferol , (DRISDOL ) 1.25 MG (50000 UNIT) CAPS capsule TAKE 1 CAPSULE EVERY WEEK   No facility-administered encounter medications on file as of 01/01/2024.    No Known Allergies  Review of Systems As per HPI  Objective:  BP 115/63   Pulse 90   Temp 98.5 F (36.9 C)   Ht 5\' 6"  (1.676 m)   Wt 177 lb (80.3 kg)   SpO2 96%   BMI 28.57 kg/m    Wt Readings from Last 3 Encounters:  01/01/24 177 lb (80.3 kg)  10/01/23 176 lb (79.8 kg)  08/13/23 178 lb (80.7 kg)   Physical Exam Constitutional:      General: He is awake. He is not in acute distress.    Appearance: Normal appearance. He is well-developed and well-groomed. He is not ill-appearing, toxic-appearing or diaphoretic.  HENT:     Right Ear: There is impacted cerumen.     Left Ear: There is impacted cerumen.     Nose: Congestion and rhinorrhea present. Rhinorrhea is clear.     Right Sinus: No maxillary sinus tenderness or frontal sinus tenderness.     Left Sinus: No maxillary sinus tenderness or frontal sinus tenderness.     Mouth/Throat:     Lips: Pink. No lesions.     Pharynx: Posterior oropharyngeal erythema present. No postnasal drip.     Tonsils: No tonsillar exudate or tonsillar abscesses. 2+ on the right. 2+ on the left.   Cardiovascular:     Rate and Rhythm: Normal rate and regular rhythm.     Pulses: Normal pulses.          Radial pulses are 2+ on the right side and 2+ on the left side.       Posterior tibial pulses are 2+ on the right side and 2+ on the left side.     Heart sounds: Normal heart sounds. No murmur heard.    No gallop.  Pulmonary:  Effort: Pulmonary effort is normal. No respiratory distress.     Breath sounds: Normal breath sounds. No stridor. No wheezing, rhonchi or rales.  Musculoskeletal:     Cervical back: Full passive range of motion without pain and neck supple.     Right lower leg: No edema.     Left lower leg: No edema.  Lymphadenopathy:     Head:     Right side of head: No submental, submandibular, tonsillar, preauricular or posterior auricular adenopathy.     Left side of head: No submental, submandibular, tonsillar, preauricular or posterior auricular adenopathy.     Cervical:     Right cervical: No superficial cervical adenopathy.    Left cervical: No superficial cervical adenopathy.  Skin:    General: Skin is warm.     Capillary Refill: Capillary refill takes less than 2 seconds.  Neurological:     General: No focal deficit present.     Mental Status: He is alert, oriented to person, place, and time and easily aroused. Mental status is at baseline.     GCS: GCS eye subscore is 4. GCS verbal subscore is 5. GCS motor subscore is 6.     Motor: No weakness.  Psychiatric:        Attention and Perception: Attention and perception normal.        Mood and Affect: Mood and affect normal.        Speech: Speech normal.        Behavior: Behavior normal. Behavior is cooperative.        Thought Content: Thought content normal. Thought content does not include homicidal or suicidal ideation. Thought content does not include homicidal or suicidal plan.        Cognition and Memory: Cognition and memory normal.        Judgment: Judgment normal.     Results for orders placed or  performed in visit on 10/01/23  Bayer DCA Hb A1c Waived   Collection Time: 10/01/23  8:00 AM  Result Value Ref Range   HB A1C (BAYER DCA - WAIVED) 6.0 (H) 4.8 - 5.6 %  CBC with Differential/Platelet   Collection Time: 10/01/23  8:01 AM  Result Value Ref Range   WBC 5.0 3.4 - 10.8 x10E3/uL   RBC 4.54 4.14 - 5.80 x10E6/uL   Hemoglobin 13.0 13.0 - 17.7 g/dL   Hematocrit 84.6 96.2 - 51.0 %   MCV 88 79 - 97 fL   MCH 28.6 26.6 - 33.0 pg   MCHC 32.6 31.5 - 35.7 g/dL   RDW 95.2 84.1 - 32.4 %   Platelets 298 150 - 450 x10E3/uL   Neutrophils 53 Not Estab. %   Lymphs 36 Not Estab. %   Monocytes 8 Not Estab. %   Eos 3 Not Estab. %   Basos 0 Not Estab. %   Neutrophils Absolute 2.7 1.4 - 7.0 x10E3/uL   Lymphocytes Absolute 1.8 0.7 - 3.1 x10E3/uL   Monocytes Absolute 0.4 0.1 - 0.9 x10E3/uL   EOS (ABSOLUTE) 0.2 0.0 - 0.4 x10E3/uL   Basophils Absolute 0.0 0.0 - 0.2 x10E3/uL   Immature Granulocytes 0 Not Estab. %   Immature Grans (Abs) 0.0 0.0 - 0.1 x10E3/uL  CMP14+EGFR   Collection Time: 10/01/23  8:01 AM  Result Value Ref Range   Glucose 94 70 - 99 mg/dL   BUN 11 8 - 27 mg/dL   Creatinine, Ser 4.01 0.76 - 1.27 mg/dL   eGFR 92 >02 VO/ZDG/6.44   BUN/Creatinine Ratio  13 10 - 24   Sodium 144 134 - 144 mmol/L   Potassium 4.2 3.5 - 5.2 mmol/L   Chloride 105 96 - 106 mmol/L   CO2 23 20 - 29 mmol/L   Calcium  9.7 8.6 - 10.2 mg/dL   Total Protein 7.0 6.0 - 8.5 g/dL   Albumin 4.6 3.8 - 4.8 g/dL   Globulin, Total 2.4 1.5 - 4.5 g/dL   Bilirubin Total 0.4 0.0 - 1.2 mg/dL   Alkaline Phosphatase 60 44 - 121 IU/L   AST 13 0 - 40 IU/L   ALT 10 0 - 44 IU/L  Lipid panel   Collection Time: 10/01/23  8:01 AM  Result Value Ref Range   Cholesterol, Total 112 100 - 199 mg/dL   Triglycerides 81 0 - 149 mg/dL   HDL 39 (L) >54 mg/dL   VLDL Cholesterol Cal 16 5 - 40 mg/dL   LDL Chol Calc (NIH) 57 0 - 99 mg/dL   Chol/HDL Ratio 2.9 0.0 - 5.0 ratio       01/01/2024    9:05 AM 10/01/2023    7:56 AM  08/13/2023    8:52 AM 06/30/2023    7:57 AM 05/13/2023   10:53 AM  Depression screen PHQ 2/9  Decreased Interest 0 0 0 0 0  Down, Depressed, Hopeless 0 0 0 0 0  PHQ - 2 Score 0 0 0 0 0  Altered sleeping 0 0   0  Tired, decreased energy 3 0   1  Change in appetite 0 0   0  Feeling bad or failure about yourself  0 0   0  Trouble concentrating 0 0   0  Moving slowly or fidgety/restless 0 0   0  Suicidal thoughts 0 0   0  PHQ-9 Score 3 0   1  Difficult doing work/chores Not difficult at all Not difficult at all   Not difficult at all       01/01/2024    9:06 AM 10/01/2023    7:57 AM 05/13/2023   10:53 AM 09/02/2022    8:41 AM  GAD 7 : Generalized Anxiety Score  Nervous, Anxious, on Edge 1 0 0 0  Control/stop worrying 0 0 0 0  Worry too much - different things 0 0 0 0  Trouble relaxing 0 0 0 0  Restless 0 0 0 0  Easily annoyed or irritable 0 0 0 0  Afraid - awful might happen 0 0 0 0  Total GAD 7 Score 1 0 0 0  Anxiety Difficulty Not difficult at all Not difficult at all Not difficult at all Not difficult at all   Pertinent labs & imaging results that were available during my care of the patient were reviewed by me and considered in my medical decision making.  Assessment & Plan:  Barry Smith was seen today for cough, congestion .  Diagnoses and all orders for this visit:  Acute cough Discussed with patient that likely viral in etiology. Discussed that symptoms can last for up to 2 weeks. Encouraged patient to follow up if they have worsening symptoms or signs of double worsening. Discussed at home care such as humidifier, saline spray, throat lozenges, increasing hydration, and tylenol  for pain or fever.  Discussed side effects of medications. Discussed risk of drowsiness with cough syrup, lower dose provided.  Labs as below. Will communicate results to patient once available. Will await results to determine next steps.  -     albuterol  (  VENTOLIN  HFA) 108 (90 Base) MCG/ACT inhaler; Inhale  2 puffs into the lungs every 6 (six) hours as needed for wheezing or shortness of breath. -     benzonatate  (TESSALON  PERLES) 100 MG capsule; Take 1 capsule (100 mg total) by mouth 3 (three) times daily as needed. -     promethazine-dextromethorphan (PROMETHAZINE-DM) 6.25-15 MG/5ML syrup; Take 2.5 mLs by mouth 4 (four) times daily as needed. -     COVID-19, Flu A+B and RSV  Other fatigue As above.  -     albuterol  (VENTOLIN  HFA) 108 (90 Base) MCG/ACT inhaler; Inhale 2 puffs into the lungs every 6 (six) hours as needed for wheezing or shortness of breath. -     benzonatate  (TESSALON  PERLES) 100 MG capsule; Take 1 capsule (100 mg total) by mouth 3 (three) times daily as needed. -     promethazine-dextromethorphan (PROMETHAZINE-DM) 6.25-15 MG/5ML syrup; Take 2.5 mLs by mouth 4 (four) times daily as needed.  Upper respiratory tract infection, unspecified type As above.  -     fluticasone  (FLONASE ) 50 MCG/ACT nasal spray; Place 1 spray into both nostrils 2 (two) times daily as needed for allergies or rhinitis.    Continue all other maintenance medications.  Follow up plan: Return if symptoms worsen or fail to improve.   Continue healthy lifestyle choices, including diet (rich in fruits, vegetables, and lean proteins, and low in salt and simple carbohydrates) and exercise (at least 30 minutes of moderate physical activity daily).  Written and verbal instructions provided   The above assessment and management plan was discussed with the patient. The patient verbalized understanding of and has agreed to the management plan. Patient is aware to call the clinic if they develop any new symptoms or if symptoms persist or worsen. Patient is aware when to return to the clinic for a follow-up visit. Patient educated on when it is appropriate to go to the emergency department.   Jacqualyn Mates, DNP-FNP [facility] [address] [place], [place] [address] [phone]

## 2024-01-02 LAB — COVID-19, FLU A+B AND RSV
Influenza A, NAA: NOT DETECTED
Influenza B, NAA: NOT DETECTED
RSV, NAA: NOT DETECTED
SARS-CoV-2, NAA: NOT DETECTED

## 2024-01-05 ENCOUNTER — Ambulatory Visit: Payer: Self-pay | Admitting: Family Medicine

## 2024-01-05 NOTE — Progress Notes (Signed)
 Negative for flu, covid, rsv. Follow up if symptoms continue.

## 2024-01-07 ENCOUNTER — Ambulatory Visit: Admitting: Family Medicine

## 2024-01-07 VITALS — BP 117/64 | HR 78 | Temp 97.7°F | Ht 66.0 in | Wt 210.0 lb

## 2024-01-07 DIAGNOSIS — E1165 Type 2 diabetes mellitus with hyperglycemia: Secondary | ICD-10-CM | POA: Diagnosis not present

## 2024-01-07 DIAGNOSIS — E1159 Type 2 diabetes mellitus with other circulatory complications: Secondary | ICD-10-CM | POA: Diagnosis not present

## 2024-01-07 DIAGNOSIS — I152 Hypertension secondary to endocrine disorders: Secondary | ICD-10-CM

## 2024-01-07 DIAGNOSIS — Z7984 Long term (current) use of oral hypoglycemic drugs: Secondary | ICD-10-CM | POA: Diagnosis not present

## 2024-01-07 DIAGNOSIS — E785 Hyperlipidemia, unspecified: Secondary | ICD-10-CM

## 2024-01-07 DIAGNOSIS — E1169 Type 2 diabetes mellitus with other specified complication: Secondary | ICD-10-CM | POA: Diagnosis not present

## 2024-01-07 LAB — CMP14+EGFR
ALT: 13 IU/L (ref 0–44)
AST: 12 IU/L (ref 0–40)
Albumin: 3.9 g/dL (ref 3.8–4.8)
Alkaline Phosphatase: 77 IU/L (ref 44–121)
BUN/Creatinine Ratio: 13 (ref 10–24)
BUN: 12 mg/dL (ref 8–27)
Bilirubin Total: 0.3 mg/dL (ref 0.0–1.2)
CO2: 23 mmol/L (ref 20–29)
Calcium: 9.1 mg/dL (ref 8.6–10.2)
Chloride: 103 mmol/L (ref 96–106)
Creatinine, Ser: 0.94 mg/dL (ref 0.76–1.27)
Globulin, Total: 2.4 g/dL (ref 1.5–4.5)
Glucose: 97 mg/dL (ref 70–99)
Potassium: 3.9 mmol/L (ref 3.5–5.2)
Sodium: 144 mmol/L (ref 134–144)
Total Protein: 6.3 g/dL (ref 6.0–8.5)
eGFR: 86 mL/min/{1.73_m2} (ref >59)

## 2024-01-07 LAB — BAYER DCA HB A1C WAIVED: HB A1C (BAYER DCA - WAIVED): 6.1 % — ABNORMAL HIGH (ref 4.8–5.6)

## 2024-01-07 LAB — LIPID PANEL
Chol/HDL Ratio: 3.9 ratio (ref 0.0–5.0)
Cholesterol, Total: 98 mg/dL — ABNORMAL LOW (ref 100–199)
HDL: 25 mg/dL — ABNORMAL LOW (ref >39)
LDL Chol Calc (NIH): 57 mg/dL (ref 0–99)
Triglycerides: 80 mg/dL (ref 0–149)
VLDL Cholesterol Cal: 16 mg/dL (ref 5–40)

## 2024-01-07 MED ORDER — HYDROCODONE BIT-HOMATROP MBR 5-1.5 MG/5ML PO SOLN: 5.0000 mL | Freq: Four times a day (QID) | ORAL | 0 refills | Status: AC | PRN

## 2024-01-07 NOTE — Progress Notes (Signed)
 Subjective:  Patient ID: Barry Creed.,  male    DOB: Aug 06, 1950  Age: 73 y.o.    CC: Medical Management of Chronic Issues ( No concerns at [facility] time. ) and Cough (Taking promethazine . Not helping. Cough productive.)   HPI Barry Trev Boley Sr. presents for  follow-up of hypertension. Patient has no history of headache chest pain or shortness of breath or recent cough. Patient also denies symptoms of TIA such as numbness weakness lateralizing. Patient denies side effects from medication. States taking it regularly.  Patient also  in for follow-up of elevated cholesterol. Doing well without complaints on current medication. Denies side effects  including myalgia and arthralgia and nausea. Also in today for liver function testing. Currently no chest pain, shortness of breath or other cardiovascular related symptoms noted.  Follow-up of diabetes. Patient does check blood sugar at home. Readings run between 80 and 120 Patient denies symptoms such as excessive hunger or urinary frequency, excessive hunger, nausea No significant hypoglycemic spells noted. Medications reviewed. Pt reports taking them regularly. Pt. denies complication/adverse reaction today.    History Barry Smith has a past medical history of Anxiety, Essential hypertension, Hyperlipidemia, and Type 2 diabetes mellitus (HCC).   Barry Smith has a past surgical history that includes Appendectomy.   His family history includes Cancer in his sister; Diabetes in his brother, brother, brother, brother, brother, father, sister, sister, sister, sister, sister, and sister; Pneumonia in his brother.Barry Smith reports that Barry Smith has never smoked. Barry Smith has never used smokeless tobacco. Barry Smith reports that Barry Smith does not drink alcohol and does not use drugs.  Current Outpatient Medications on File Prior to Visit  Medication Sig Dispense Refill   albuterol  (VENTOLIN  HFA) 108 (90 Base) MCG/ACT inhaler Inhale 2 puffs into the lungs every 6 (six) hours as needed for  wheezing or shortness of breath. 6.7 g 0   aspirin 81 MG tablet Take 81 mg by mouth daily.     atorvastatin  (LIPITOR) 40 MG tablet TAKE ONE (1) TABLET EACH DAY 90 tablet 3   benzonatate  (TESSALON  PERLES) 100 MG capsule Take 1 capsule (100 mg total) by mouth 3 (three) times daily as needed. 30 capsule 0   cholecalciferol (VITAMIN D ) 1000 UNITS tablet Take 1 tablet (1,000 Units total) by mouth daily. 90 tablet 3   fluticasone  (FLONASE ) 50 MCG/ACT nasal spray Place 1 spray into both nostrils 2 (two) times daily as needed for allergies or rhinitis. 16 g 6   gabapentin  (NEURONTIN ) 300 MG capsule TAKE ONE (1) CAPSULE THREE (3) TIMES EACH DAY 270 capsule 3   glimepiride  (AMARYL ) 2 MG tablet Take 1 tablet (2 mg total) by mouth every morning. 90 tablet 3   glucose blood (ACCU-CHEK GUIDE TEST) test strip 1 each by Other route as needed for other. Use as instructed 100 each 10   losartan  (COZAAR ) 100 MG tablet Take 1 tablet (100 mg total) by mouth daily. 90 tablet 3   metFORMIN  (GLUCOPHAGE ) 500 MG tablet TAKE 2 TABLETS EVERY MORNING AND 3 TABLETS AT BEDTIME 450 tablet 3   Multiple Vitamin (MULTIVITAMIN) capsule Take 1 capsule by mouth daily.     omega-3 acid ethyl esters (LOVAZA ) 1 g capsule Take 2 capsules (2 g total) by mouth 2 (two) times daily. 360 capsule 3   Omega-3 Fatty Acids (FISH OIL) 1200 MG CAPS Take 2 capsules by mouth daily.      sitaGLIPtin  (JANUVIA ) 50 MG tablet TAKE ONE (1) TABLET EACH DAY 90 tablet 3  tamsulosin  (FLOMAX ) 0.4 MG CAPS capsule TAKE 1 CAPSULE EVERY DAY 90 capsule 0   Vitamin D , Ergocalciferol , (DRISDOL ) 1.25 MG (50000 UNIT) CAPS capsule TAKE 1 CAPSULE EVERY WEEK 12 capsule 1   No current facility-administered medications on file prior to visit.    ROS Review of Systems  Constitutional:  Negative for fever.  Respiratory:  Negative for shortness of breath.   Cardiovascular:  Negative for chest pain.  Musculoskeletal:  Negative for arthralgias.  Skin:  Negative for rash.     Objective:  BP 117/64   Pulse 78   Temp 97.7 F (36.5 C)   Ht 5' 6 (1.676 m)   Wt 210 lb (95.3 kg)   SpO2 97%   BMI 33.89 kg/m   BP Readings from Last 3 Encounters:  01/07/24 117/64  01/01/24 115/63  10/01/23 138/66    Wt Readings from Last 3 Encounters:  01/07/24 210 lb (95.3 kg)  01/01/24 177 lb (80.3 kg)  10/01/23 176 lb (79.8 kg)    Lab Results  Component Value Date   HGBA1C 6.1 (H) 01/07/2024   HGBA1C 6.0 (H) 10/01/2023   HGBA1C 6.0 (H) 06/30/2023    Physical Exam Vitals reviewed.  Constitutional:      Appearance: Barry Smith is well-developed.  HENT:     Head: Normocephalic and atraumatic.     Right Ear: External ear normal.     Left Ear: External ear normal.     Mouth/Throat:     Pharynx: No oropharyngeal exudate or posterior oropharyngeal erythema.   Eyes:     Pupils: Pupils are equal, round, and reactive to light.    Cardiovascular:     Rate and Rhythm: Normal rate and regular rhythm.     Heart sounds: No murmur heard. Pulmonary:     Effort: No respiratory distress.     Breath sounds: Normal breath sounds.   Musculoskeletal:     Cervical back: Normal range of motion and neck supple.   Neurological:     Mental Status: Barry Smith is alert and oriented to person, place, and time.         Assessment & Plan:  Type 2 diabetes mellitus with hyperglycemia, without long-term current use of insulin  (HCC) -     Bayer DCA Hb A1c Waived  Hypertension associated with diabetes (HCC) -     CMP14+EGFR  Hyperlipidemia associated with type 2 diabetes mellitus (HCC) -     Lipid panel  Other orders -     HYDROcodone  Bit-Homatrop MBr; Take 5 mLs by mouth every 6 (six) hours as needed for up to 5 days for cough.  Dispense: 100 mL; Refill: 0    Follow-up: Return in about 3 months (around 04/08/2024).  Roise Cleaver, M.D.

## 2024-01-08 ENCOUNTER — Encounter: Payer: Self-pay | Admitting: Family Medicine

## 2024-01-12 ENCOUNTER — Ambulatory Visit: Payer: Self-pay | Admitting: Family Medicine

## 2024-01-12 NOTE — Progress Notes (Signed)
Hello [name],  Your lab result is normal and/or stable.Some minor variations that are not significant are commonly marked abnormal, but do not represent any medical problem for you.  Best regards, Claretta Fraise, M.D.

## 2024-02-01 ENCOUNTER — Other Ambulatory Visit: Payer: Self-pay | Admitting: Family Medicine

## 2024-02-01 DIAGNOSIS — E1142 Type 2 diabetes mellitus with diabetic polyneuropathy: Secondary | ICD-10-CM

## 2024-02-01 DIAGNOSIS — E1165 Type 2 diabetes mellitus with hyperglycemia: Secondary | ICD-10-CM

## 2024-02-17 ENCOUNTER — Other Ambulatory Visit: Payer: Self-pay | Admitting: Family Medicine

## 2024-02-17 DIAGNOSIS — E1142 Type 2 diabetes mellitus with diabetic polyneuropathy: Secondary | ICD-10-CM

## 2024-02-22 ENCOUNTER — Ambulatory Visit: Payer: Self-pay

## 2024-02-22 ENCOUNTER — Other Ambulatory Visit: Payer: Self-pay | Admitting: Family Medicine

## 2024-02-22 MED ORDER — ACCU-CHEK GUIDE TEST VI STRP: 1.0000 | ORAL_STRIP | 10 refills | Status: AC | PRN

## 2024-02-22 NOTE — Telephone Encounter (Unsigned)
 Copied from CRM [id number]. Topic: Clinical - Medication Refill >> Feb 22, 2024 10:12 AM Nathanel BROCKS wrote: Medication: glucose blood (ACCU-CHEK GUIDE TEST) test strip  Has the patient contacted their pharmacy? No  This is the patient's preferred pharmacy:  [facility], [place] - [address] [place] [address] Phone: [phone] Fax: [phone]  Is this the correct pharmacy for this prescription? Yes If no, delete pharmacy and type the correct one.   Has the prescription been filled recently? Yes  Is the patient out of the medication? Yes  Has the patient been seen for an appointment in the last year OR does the patient have an upcoming appointment? Yes  Can we respond through MyChart? No  Agent: Please be advised that Rx refills may take up to 3 business days. We ask that you follow-up with your pharmacy.

## 2024-02-22 NOTE — Telephone Encounter (Signed)
 FYI Only or Action Required?: FYI only for provider.  Patient was last seen in primary care on 01/07/2024 by Zollie Lowers, MD.  Called Nurse Triage reporting Pain.  Symptoms began ongoing and worsening x 3 days.  Interventions attempted: OTC medications: alieve.  Symptoms are: gradually worsening.  Triage Disposition: See PCP When Office is Open (Within 3 Days)  Patient/caregiver understands and will follow disposition?: Yes    Copied from CRM #[id number]. Topic: Clinical - Red Word Triage >> Feb 22, 2024 10:32 AM Barry Smith wrote: Red Word that prompted transfer to Nurse Triage: pt having pain in the knee says on pain level is a 10 Reason for Disposition  [1] MODERATE pain (e.g., interferes with normal activities, limping) AND [2] present > 3 days  Answer Assessment - Initial Assessment Questions 1. ONSET: When did the pain start?      X 3 days 2. LOCATION: Where is the pain located?      Right knee 3. PAIN: How bad is the pain?    (Scale 1-10; or mild, moderate, severe)     10/10 4. WORK OR EXERCISE: Has there been any recent work or exercise that involved this part of the body?      no 5. CAUSE: What do you think is causing the leg pain?     unknown 6. OTHER SYMPTOMS: Do you have any other symptoms? (e.g., chest pain, back pain, breathing difficulty, swelling, rash, fever, numbness, weakness)     no 7. PREGNANCY: Is there any chance you are pregnant? When was your last menstrual period?     na  Protocols used: Leg Pain-A-AH

## 2024-02-22 NOTE — Telephone Encounter (Signed)
Noted  -LS

## 2024-02-25 ENCOUNTER — Other Ambulatory Visit: Payer: Self-pay | Admitting: Family Medicine

## 2024-02-25 DIAGNOSIS — N401 Enlarged prostate with lower urinary tract symptoms: Secondary | ICD-10-CM

## 2024-02-26 ENCOUNTER — Ambulatory Visit: Admitting: Nurse Practitioner

## 2024-02-29 ENCOUNTER — Ambulatory Visit (INDEPENDENT_AMBULATORY_CARE_PROVIDER_SITE_OTHER)

## 2024-02-29 ENCOUNTER — Ambulatory Visit (INDEPENDENT_AMBULATORY_CARE_PROVIDER_SITE_OTHER): Admitting: Family Medicine

## 2024-02-29 ENCOUNTER — Encounter: Payer: Self-pay | Admitting: Family Medicine

## 2024-02-29 VITALS — BP 126/64 | HR 72 | Temp 97.9°F | Ht 66.0 in | Wt 177.4 lb

## 2024-02-29 DIAGNOSIS — M25562 Pain in left knee: Secondary | ICD-10-CM | POA: Diagnosis not present

## 2024-02-29 DIAGNOSIS — M25561 Pain in right knee: Secondary | ICD-10-CM

## 2024-02-29 DIAGNOSIS — M1711 Unilateral primary osteoarthritis, right knee: Secondary | ICD-10-CM | POA: Diagnosis not present

## 2024-02-29 NOTE — Progress Notes (Signed)
   Subjective:  Patient ID: Barry Smith., male    DOB: Aug 11, 1950  Age: 73 y.o. MRN: [id number]  CC: Knee Pain (RIGHT KNEE)   HPI Barry Endicott Sr. presents for right knee pain with ambulation. Worse at night. Onset 1 month ago. NKI. Minimal relief with NSAIDS.OTC.     02/29/2024    2:01 PM 01/07/2024    7:59 AM 01/01/2024    9:05 AM  Depression screen PHQ 2/9  Decreased Interest 0 0 0  Down, Depressed, Hopeless 0 0 0  PHQ - 2 Score 0 0 0  Altered sleeping  0 0  Tired, decreased energy  0 3  Change in appetite  0 0  Feeling bad or failure about yourself   3 0  Trouble concentrating  0 0  Moving slowly or fidgety/restless  0 0  Suicidal thoughts  0 0  PHQ-9 Score  3 3  Difficult doing work/chores  Not difficult at all Not difficult at all    History Barry Smith has a past medical history of Anxiety, Essential hypertension, Hyperlipidemia, and Type 2 diabetes mellitus (HCC).   He has a past surgical history that includes Appendectomy.   His family history includes Cancer in his sister; Diabetes in his brother, brother, brother, brother, brother, father, sister, sister, sister, sister, sister, and sister; Pneumonia in his brother.He reports that he has never smoked. He has never used smokeless tobacco. He reports that he does not drink alcohol and does not use drugs.    ROS Review of Systems  Objective:  BP 126/64   Pulse 72   Temp 97.9 F (36.6 C)   Ht 5' 6 (1.676 m)   Wt 177 lb 6.4 oz (80.5 kg)   SpO2 96%   BMI 28.63 kg/m   BP Readings from Last 3 Encounters:  02/29/24 126/64  01/07/24 117/64  01/01/24 115/63    Wt Readings from Last 3 Encounters:  02/29/24 177 lb 6.4 oz (80.5 kg)  01/07/24 210 lb (95.3 kg)  01/01/24 177 lb (80.3 kg)     Physical Exam Vitals reviewed.  Constitutional:      General: He is not in acute distress.    Appearance: Normal appearance.  Cardiovascular:     Rate and Rhythm: Normal rate and regular rhythm.  Pulmonary:      Breath sounds: Normal breath sounds.  Musculoskeletal:        General: Tenderness (anterior joint line, right knee) present. Normal range of motion.  Neurological:     Mental Status: He is alert.  Psychiatric:        Mood and Affect: Mood normal.        Behavior: Behavior normal.        Thought Content: Thought content normal.    X-ray shows evidence for arthritis with joint space narrowing of the right medial compartment  Assessment & Plan:  Acute pain of right knee -     DG Knee 1-2 Views Right; Future  Various treatments discussed with patient were declined by him. Follow-up: Return if symptoms worsen or fail to improve.  Butler Der, M.D.

## 2024-03-03 DIAGNOSIS — H524 Presbyopia: Secondary | ICD-10-CM | POA: Diagnosis not present

## 2024-03-03 DIAGNOSIS — H52223 Regular astigmatism, bilateral: Secondary | ICD-10-CM | POA: Diagnosis not present

## 2024-03-03 DIAGNOSIS — H2513 Age-related nuclear cataract, bilateral: Secondary | ICD-10-CM | POA: Diagnosis not present

## 2024-03-03 DIAGNOSIS — E119 Type 2 diabetes mellitus without complications: Secondary | ICD-10-CM | POA: Diagnosis not present

## 2024-03-03 DIAGNOSIS — H5203 Hypermetropia, bilateral: Secondary | ICD-10-CM | POA: Diagnosis not present

## 2024-03-06 ENCOUNTER — Encounter: Payer: Self-pay | Admitting: Family Medicine

## 2024-03-09 ENCOUNTER — Ambulatory Visit: Payer: Self-pay | Admitting: Family Medicine

## 2024-03-24 ENCOUNTER — Other Ambulatory Visit: Payer: Self-pay | Admitting: Family Medicine

## 2024-03-24 DIAGNOSIS — E559 Vitamin D deficiency, unspecified: Secondary | ICD-10-CM

## 2024-03-24 NOTE — Telephone Encounter (Signed)
 Last OV 02/29/24. Last RF 09/30/23. Next OV 07/11/24

## 2024-04-08 ENCOUNTER — Other Ambulatory Visit: Payer: Self-pay | Admitting: Family Medicine

## 2024-04-08 DIAGNOSIS — E1165 Type 2 diabetes mellitus with hyperglycemia: Secondary | ICD-10-CM

## 2024-04-08 DIAGNOSIS — E1142 Type 2 diabetes mellitus with diabetic polyneuropathy: Secondary | ICD-10-CM

## 2024-04-12 ENCOUNTER — Telehealth: Payer: Self-pay | Admitting: Family Medicine

## 2024-04-12 NOTE — Telephone Encounter (Signed)
 LMTCB to cancel one of his appt's for AWV. He does not need two of the same appt's within a week bc Ins will not pay for this.

## 2024-05-06 ENCOUNTER — Other Ambulatory Visit: Payer: Self-pay | Admitting: Family Medicine

## 2024-05-06 DIAGNOSIS — E1165 Type 2 diabetes mellitus with hyperglycemia: Secondary | ICD-10-CM

## 2024-05-06 DIAGNOSIS — E1142 Type 2 diabetes mellitus with diabetic polyneuropathy: Secondary | ICD-10-CM

## 2024-05-08 ENCOUNTER — Other Ambulatory Visit: Payer: Self-pay | Admitting: Family Medicine

## 2024-05-08 DIAGNOSIS — N401 Enlarged prostate with lower urinary tract symptoms: Secondary | ICD-10-CM

## 2024-05-17 ENCOUNTER — Ambulatory Visit

## 2024-05-23 ENCOUNTER — Telehealth: Payer: Self-pay | Admitting: Family Medicine

## 2024-05-23 NOTE — Telephone Encounter (Signed)
 I called pt's wife on 05-20-2024 & LMTCB to make an appt for AWV (in person) with son Rumbold Shyquan, [place]) to come in w/him & his wife for Nov. 5 (Wednesday at 3:30pm) Nurse-Alia/Blia will have to make appt's, when wife calls back bc she has to open appt's.

## 2024-05-24 ENCOUNTER — Ambulatory Visit

## 2024-07-11 ENCOUNTER — Encounter: Payer: Self-pay | Admitting: Family Medicine

## 2024-07-11 ENCOUNTER — Ambulatory Visit: Payer: Self-pay | Admitting: Family Medicine

## 2024-07-11 VITALS — BP 120/76 | HR 70 | Temp 98.1°F | Ht 66.0 in | Wt 177.0 lb

## 2024-07-11 DIAGNOSIS — E1165 Type 2 diabetes mellitus with hyperglycemia: Secondary | ICD-10-CM | POA: Diagnosis not present

## 2024-07-11 DIAGNOSIS — E785 Hyperlipidemia, unspecified: Secondary | ICD-10-CM | POA: Diagnosis not present

## 2024-07-11 DIAGNOSIS — E1159 Type 2 diabetes mellitus with other circulatory complications: Secondary | ICD-10-CM | POA: Diagnosis not present

## 2024-07-11 DIAGNOSIS — E1169 Type 2 diabetes mellitus with other specified complication: Secondary | ICD-10-CM

## 2024-07-11 DIAGNOSIS — R5383 Other fatigue: Secondary | ICD-10-CM

## 2024-07-11 DIAGNOSIS — I152 Hypertension secondary to endocrine disorders: Secondary | ICD-10-CM | POA: Diagnosis not present

## 2024-07-11 DIAGNOSIS — Z7984 Long term (current) use of oral hypoglycemic drugs: Secondary | ICD-10-CM | POA: Diagnosis not present

## 2024-07-11 DIAGNOSIS — E1142 Type 2 diabetes mellitus with diabetic polyneuropathy: Secondary | ICD-10-CM

## 2024-07-11 LAB — BAYER DCA HB A1C WAIVED: HB A1C (BAYER DCA - WAIVED): 6 % — ABNORMAL HIGH (ref 4.8–5.6)

## 2024-07-11 MED ORDER — GABAPENTIN 300 MG PO CAPS: ORAL_CAPSULE | ORAL | 1 refills | Status: AC

## 2024-07-11 MED ORDER — LOSARTAN POTASSIUM 100 MG PO TABS: 100.0000 mg | ORAL_TABLET | Freq: Every day | ORAL | 3 refills | Status: AC

## 2024-07-11 MED ORDER — GLIMEPIRIDE 2 MG PO TABS: 2.0000 mg | ORAL_TABLET | Freq: Every morning | ORAL | 3 refills | Status: AC

## 2024-07-11 MED ORDER — OMEGA-3-ACID ETHYL ESTERS 1 G PO CAPS: 2.0000 g | ORAL_CAPSULE | Freq: Two times a day (BID) | ORAL | 3 refills | Status: AC

## 2024-07-11 MED ORDER — ATORVASTATIN CALCIUM 40 MG PO TABS: ORAL_TABLET | ORAL | 3 refills | Status: AC

## 2024-07-11 NOTE — Progress Notes (Signed)
 Subjective:  Patient ID: Barry Smith., male    DOB: 02/22/1951  Age: 73 y.o. MRN: [id number]  CC: Medical Management of Chronic Issues   HPI  Discussed the use of AI scribe software for clinical note transcription with the patient, who gave verbal consent to proceed.  History of Present Illness Barry Luczak Sr. is a 73 year old male with diabetes who presents for management of his condition.  He has been managing his diabetes by maintaining a consistent diet, avoiding high-carbohydrate foods such as corn, potatoes, rice, and bread. He acknowledges the difficulty in maintaining this diet but is trying his best.  He is not checking his blood sugar as frequently as he should but feels well and can tell the difference in the way he feels. He is less active during colder weather, as he tends to stay indoors more, which may affect his overall activity level.  He continues to work part-time, running a systems developer, which provides him with some physical activity and additional income. He has been retired since age 37 but returned to work shortly after retirement. He finds the work manageable and enjoys the movement it provides.  No problems with swelling and his medications are agreeable to him.     in for follow-up of elevated cholesterol. Doing well without complaints on current medication. Denies side effects of statin including myalgia and arthralgia and nausea. Currently no chest pain, shortness of breath or other cardiovascular related symptoms noted.   presents for  follow-up of hypertension. Patient has no history of headache chest pain or shortness of breath or recent cough. Patient also denies symptoms of TIA such as focal numbness or weakness. Patient denies side effects from medication. States taking it regularly.      02/29/2024    2:01 PM 01/07/2024    7:59 AM 01/01/2024    9:05 AM  Depression screen PHQ 2/9  Decreased Interest 0 0 0  Down,  Depressed, Hopeless 0 0 0  PHQ - 2 Score 0 0 0  Altered sleeping  0 0  Tired, decreased energy  0 3  Change in appetite  0 0  Feeling bad or failure about yourself   3 0  Trouble concentrating  0 0  Moving slowly or fidgety/restless  0 0  Suicidal thoughts  0 0  PHQ-9 Score  3  3   Difficult doing work/chores  Not difficult at all Not difficult at all     Data saved with a previous flowsheet row definition    History Barry Smith has a past medical history of Anxiety, Essential hypertension, Hyperlipidemia, and Type 2 diabetes mellitus (HCC).   He has a past surgical history that includes Appendectomy.   His family history includes Cancer in his sister; Diabetes in his brother, brother, brother, brother, brother, father, sister, sister, sister, sister, sister, and sister; Pneumonia in his brother.He reports that he has never smoked. He has never used smokeless tobacco. He reports that he does not drink alcohol and does not use drugs.    ROS Review of Systems  Constitutional: Negative.   HENT: Negative.    Eyes:  Negative for visual disturbance.  Respiratory:  Negative for cough and shortness of breath.   Cardiovascular:  Negative for chest pain and leg swelling.  Gastrointestinal:  Negative for abdominal pain, diarrhea, nausea and vomiting.  Genitourinary:  Negative for difficulty urinating.  Musculoskeletal:  Negative for arthralgias and myalgias.  Skin:  Negative  for rash.  Neurological:  Negative for headaches.  Psychiatric/Behavioral:  Negative for sleep disturbance.     Objective:  Ht 5' 6 (1.676 m)   BMI 28.63 kg/m   BP Readings from Last 3 Encounters:  02/29/24 126/64  01/07/24 117/64  01/01/24 115/63    Wt Readings from Last 3 Encounters:  02/29/24 177 lb 6.4 oz (80.5 kg)  01/07/24 210 lb (95.3 kg)  01/01/24 177 lb (80.3 kg)     Physical Exam Physical Exam GENERAL: Alert, cooperative, well developed, no acute distress HEENT: Normocephalic, normal  oropharynx, moist mucous membranes CHEST: Clear to auscultation bilaterally, no wheezes, rhonchi, or crackles CARDIOVASCULAR: Normal heart rate and rhythm, S1 and S2 normal without murmurs ABDOMEN: Soft, non-tender, non-distended, without organomegaly, normal bowel sounds EXTREMITIES: No cyanosis or edema NEUROLOGICAL: Cranial nerves grossly intact, moves all extremities without gross motor or sensory deficit   Assessment & Plan:  Type 2 diabetes mellitus with hyperglycemia, without long-term current use of insulin  (HCC) -     Bayer DCA Hb A1c Waived -     Microalbumin / creatinine urine ratio -     Glimepiride ; Take 1 tablet (2 mg total) by mouth every morning.  Dispense: 90 tablet; Refill: 3  Other fatigue -     Bayer DCA Hb A1c Waived -     Microalbumin / creatinine urine ratio -     Lipid panel -     Comprehensive metabolic panel with GFR -     CBC with Differential/Platelet  Hyperlipidemia associated with type 2 diabetes mellitus (HCC) -     Lipid panel -     Comprehensive metabolic panel with GFR -     Atorvastatin  Calcium ; TAKE ONE (1) TABLET EACH DAY  Dispense: 90 tablet; Refill: 3  Hypertension associated with diabetes (HCC) -     CBC with Differential/Platelet -     Losartan  Potassium; Take 1 tablet (100 mg total) by mouth daily.  Dispense: 90 tablet; Refill: 3  Diabetic polyneuropathy associated with type 2 diabetes mellitus (HCC) -     Gabapentin ; TAKE 1 CAPSULE BY MOUTH THREE TIMES A DAY  Dispense: 270 capsule; Refill: 1 -     Glimepiride ; Take 1 tablet (2 mg total) by mouth every morning.  Dispense: 90 tablet; Refill: 3  Other orders -     Omega-3-acid  Ethyl Esters; Take 2 capsules (2 g total) by mouth 2 (two) times daily.  Dispense: 360 capsule; Refill: 3    Assessment and Plan Assessment & Plan Type 2 diabetes mellitus with hyperglycemia   Type 2 diabetes mellitus with hyperglycemia is managed with dietary modifications. He adheres to a low-carbohydrate diet,  avoiding corn, potatoes, rice, and bread. Blood sugar levels are well-controlled, though monitoring is less frequent than recommended. No swelling is reported. Await A1c results. Encourage regular blood sugar monitoring and continue dietary modifications to manage blood sugar levels.       Follow-up: No follow-ups on file.  Butler Der, M.D.

## 2024-07-12 LAB — COMPREHENSIVE METABOLIC PANEL WITH GFR
ALT: 12 IU/L (ref 0–44)
AST: 16 IU/L (ref 0–40)
Albumin: 4.6 g/dL (ref 3.8–4.8)
Alkaline Phosphatase: 58 IU/L (ref 47–123)
BUN/Creatinine Ratio: 12 (ref 10–24)
BUN: 11 mg/dL (ref 8–27)
Bilirubin Total: 0.4 mg/dL (ref 0.0–1.2)
CO2: 25 mmol/L (ref 20–29)
Calcium: 10 mg/dL (ref 8.6–10.2)
Chloride: 104 mmol/L (ref 96–106)
Creatinine, Ser: 0.92 mg/dL (ref 0.76–1.27)
Globulin, Total: 2.6 g/dL (ref 1.5–4.5)
Glucose: 121 mg/dL — ABNORMAL HIGH (ref 70–99)
Potassium: 4.5 mmol/L (ref 3.5–5.2)
Sodium: 143 mmol/L (ref 134–144)
Total Protein: 7.2 g/dL (ref 6.0–8.5)
eGFR: 88 mL/min/1.73 (ref >59)

## 2024-07-12 LAB — MICROALBUMIN / CREATININE URINE RATIO
Creatinine, Urine: 169.8 mg/dL
Microalb/Creat Ratio: 13 mg/g{creat} (ref 0–29)
Microalbumin, Urine: 22.3 ug/mL

## 2024-07-12 LAB — CBC WITH DIFFERENTIAL/PLATELET
Basophils Absolute: 0 x10E3/uL (ref 0.0–0.2)
Basos: 1 %
EOS (ABSOLUTE): 0.2 x10E3/uL (ref 0.0–0.4)
Eos: 3 %
Hematocrit: 42.3 % (ref 37.5–51.0)
Hemoglobin: 13.6 g/dL (ref 13.0–17.7)
Immature Grans (Abs): 0 x10E3/uL (ref 0.0–0.1)
Immature Granulocytes: 0 %
Lymphocytes Absolute: 1.8 x10E3/uL (ref 0.7–3.1)
Lymphs: 34 %
MCH: 28.4 pg (ref 26.6–33.0)
MCHC: 32.2 g/dL (ref 31.5–35.7)
MCV: 88 fL (ref 79–97)
Monocytes Absolute: 0.4 x10E3/uL (ref 0.1–0.9)
Monocytes: 7 %
Neutrophils Absolute: 2.9 x10E3/uL (ref 1.4–7.0)
Neutrophils: 55 %
Platelets: 299 x10E3/uL (ref 150–450)
RBC: 4.79 x10E6/uL (ref 4.14–5.80)
RDW: 13.4 % (ref 11.6–15.4)
WBC: 5.3 x10E3/uL (ref 3.4–10.8)

## 2024-07-12 LAB — LIPID PANEL
Chol/HDL Ratio: 3.1 ratio (ref 0.0–5.0)
Cholesterol, Total: 110 mg/dL (ref 100–199)
HDL: 36 mg/dL — ABNORMAL LOW (ref >39)
LDL Chol Calc (NIH): 61 mg/dL (ref 0–99)
Triglycerides: 58 mg/dL (ref 0–149)
VLDL Cholesterol Cal: 13 mg/dL (ref 5–40)

## 2024-07-17 NOTE — Progress Notes (Signed)
Hello [name],  Your lab result is normal and/or stable.Some minor variations that are not significant are commonly marked abnormal, but do not represent any medical problem for you.  Best regards, Claretta Fraise, M.D.

## 2024-07-22 ENCOUNTER — Other Ambulatory Visit: Payer: Self-pay | Admitting: Family Medicine

## 2024-07-22 DIAGNOSIS — N401 Enlarged prostate with lower urinary tract symptoms: Secondary | ICD-10-CM

## 2024-07-30 ENCOUNTER — Other Ambulatory Visit: Payer: Self-pay | Admitting: Family Medicine

## 2024-07-30 DIAGNOSIS — E1142 Type 2 diabetes mellitus with diabetic polyneuropathy: Secondary | ICD-10-CM

## 2024-07-30 DIAGNOSIS — E1165 Type 2 diabetes mellitus with hyperglycemia: Secondary | ICD-10-CM

## 2024-10-11 ENCOUNTER — Ambulatory Visit: Admitting: Family Medicine
